# Patient Record
Sex: Male | Born: 1955
Health system: Southern US, Community
[De-identification: ages and names within clinical notes are randomized; demographics above are authoritative.]

## PROBLEM LIST (undated history)

## (undated) DIAGNOSIS — K921 Melena: Secondary | ICD-10-CM

## (undated) DIAGNOSIS — L309 Dermatitis, unspecified: Secondary | ICD-10-CM

## (undated) DIAGNOSIS — C61 Malignant neoplasm of prostate: Secondary | ICD-10-CM

## (undated) DIAGNOSIS — N529 Male erectile dysfunction, unspecified: Secondary | ICD-10-CM

## (undated) DIAGNOSIS — R112 Nausea with vomiting, unspecified: Secondary | ICD-10-CM

## (undated) DIAGNOSIS — E669 Obesity, unspecified: Secondary | ICD-10-CM

## (undated) DIAGNOSIS — B019 Varicella without complication: Secondary | ICD-10-CM

## (undated) DIAGNOSIS — Z9889 Other specified postprocedural states: Secondary | ICD-10-CM

## (undated) DIAGNOSIS — T7840XA Allergy, unspecified, initial encounter: Secondary | ICD-10-CM

## (undated) DIAGNOSIS — R351 Nocturia: Secondary | ICD-10-CM

## (undated) DIAGNOSIS — R0683 Snoring: Secondary | ICD-10-CM

## (undated) DIAGNOSIS — I2510 Atherosclerotic heart disease of native coronary artery without angina pectoris: Secondary | ICD-10-CM

## (undated) DIAGNOSIS — Z0000 Encounter for general adult medical examination without abnormal findings: Secondary | ICD-10-CM

## (undated) DIAGNOSIS — E785 Hyperlipidemia, unspecified: Secondary | ICD-10-CM

## (undated) DIAGNOSIS — I10 Essential (primary) hypertension: Secondary | ICD-10-CM

## (undated) DIAGNOSIS — E663 Overweight: Secondary | ICD-10-CM

## (undated) DIAGNOSIS — N400 Enlarged prostate without lower urinary tract symptoms: Secondary | ICD-10-CM

## (undated) DIAGNOSIS — E119 Type 2 diabetes mellitus without complications: Secondary | ICD-10-CM

## (undated) DIAGNOSIS — M1990 Unspecified osteoarthritis, unspecified site: Secondary | ICD-10-CM

## (undated) HISTORY — DX: Dermatitis, unspecified: L30.9

## (undated) HISTORY — DX: Varicella without complication: B01.9

## (undated) HISTORY — DX: Hyperlipidemia, unspecified: E78.5

## (undated) HISTORY — PX: HERNIA REPAIR: SHX51

## (undated) HISTORY — PX: VASECTOMY: SHX75

## (undated) HISTORY — PX: PROSTATE BIOPSY: SHX241

## (undated) HISTORY — PX: APPENDECTOMY: SHX54

## (undated) HISTORY — DX: Obesity, unspecified: E66.9

## (undated) HISTORY — DX: Allergy, unspecified, initial encounter: T78.40XA

## (undated) HISTORY — DX: Nocturia: R35.1

## (undated) HISTORY — DX: Type 2 diabetes mellitus without complications: E11.9

## (undated) HISTORY — DX: Male erectile dysfunction, unspecified: N52.9

## (undated) HISTORY — DX: Overweight: E66.3

## (undated) HISTORY — DX: Benign prostatic hyperplasia without lower urinary tract symptoms: N40.0

## (undated) HISTORY — DX: Unspecified osteoarthritis, unspecified site: M19.90

## (undated) HISTORY — DX: Snoring: R06.83

## (undated) HISTORY — DX: Essential (primary) hypertension: I10

## (undated) HISTORY — DX: Encounter for general adult medical examination without abnormal findings: Z00.00

## (undated) HISTORY — DX: Melena: K92.1

---

## 2000-03-24 ENCOUNTER — Encounter: Payer: Self-pay | Admitting: Emergency Medicine

## 2000-03-24 ENCOUNTER — Emergency Department (HOSPITAL_COMMUNITY): Admission: EM | Admit: 2000-03-24 | Discharge: 2000-03-24 | Payer: Self-pay | Admitting: Emergency Medicine

## 2000-10-29 ENCOUNTER — Emergency Department (HOSPITAL_COMMUNITY): Admission: EM | Admit: 2000-10-29 | Discharge: 2000-10-29 | Payer: Self-pay | Admitting: Emergency Medicine

## 2000-11-07 ENCOUNTER — Ambulatory Visit (HOSPITAL_COMMUNITY): Admission: RE | Admit: 2000-11-07 | Discharge: 2000-11-07 | Payer: Self-pay | Admitting: *Deleted

## 2000-11-07 ENCOUNTER — Encounter: Payer: Self-pay | Admitting: *Deleted

## 2003-12-27 HISTORY — PX: EYE SURGERY: SHX253

## 2009-12-26 HISTORY — PX: OTHER SURGICAL HISTORY: SHX169

## 2012-09-12 ENCOUNTER — Ambulatory Visit (INDEPENDENT_AMBULATORY_CARE_PROVIDER_SITE_OTHER): Payer: Self-pay | Admitting: Family Medicine

## 2012-09-12 ENCOUNTER — Encounter: Payer: Self-pay | Admitting: Family Medicine

## 2012-09-12 VITALS — BP 132/89 | HR 75 | Temp 97.4°F | Ht 74.0 in | Wt 258.1 lb

## 2012-09-12 DIAGNOSIS — N529 Male erectile dysfunction, unspecified: Secondary | ICD-10-CM

## 2012-09-12 DIAGNOSIS — Z23 Encounter for immunization: Secondary | ICD-10-CM

## 2012-09-12 DIAGNOSIS — Z0000 Encounter for general adult medical examination without abnormal findings: Secondary | ICD-10-CM

## 2012-09-12 DIAGNOSIS — L309 Dermatitis, unspecified: Secondary | ICD-10-CM

## 2012-09-12 DIAGNOSIS — T7840XA Allergy, unspecified, initial encounter: Secondary | ICD-10-CM | POA: Insufficient documentation

## 2012-09-12 DIAGNOSIS — E663 Overweight: Secondary | ICD-10-CM

## 2012-09-12 DIAGNOSIS — R0989 Other specified symptoms and signs involving the circulatory and respiratory systems: Secondary | ICD-10-CM

## 2012-09-12 DIAGNOSIS — R0609 Other forms of dyspnea: Secondary | ICD-10-CM

## 2012-09-12 DIAGNOSIS — E669 Obesity, unspecified: Secondary | ICD-10-CM

## 2012-09-12 DIAGNOSIS — N400 Enlarged prostate without lower urinary tract symptoms: Secondary | ICD-10-CM

## 2012-09-12 DIAGNOSIS — R351 Nocturia: Secondary | ICD-10-CM

## 2012-09-12 DIAGNOSIS — R0683 Snoring: Secondary | ICD-10-CM

## 2012-09-12 DIAGNOSIS — I10 Essential (primary) hypertension: Secondary | ICD-10-CM | POA: Insufficient documentation

## 2012-09-12 DIAGNOSIS — L259 Unspecified contact dermatitis, unspecified cause: Secondary | ICD-10-CM

## 2012-09-12 HISTORY — DX: Obesity, unspecified: E66.9

## 2012-09-12 HISTORY — DX: Nocturia: R35.1

## 2012-09-12 HISTORY — DX: Dermatitis, unspecified: L30.9

## 2012-09-12 HISTORY — DX: Snoring: R06.83

## 2012-09-12 HISTORY — DX: Male erectile dysfunction, unspecified: N52.9

## 2012-09-12 HISTORY — DX: Benign prostatic hyperplasia without lower urinary tract symptoms: N40.0

## 2012-09-12 HISTORY — DX: Encounter for general adult medical examination without abnormal findings: Z00.00

## 2012-09-12 HISTORY — DX: Overweight: E66.3

## 2012-09-12 LAB — CBC
MCV: 91.1 fl (ref 78.0–100.0)
RBC: 5.02 Mil/uL (ref 4.22–5.81)
WBC: 9.2 10*3/uL (ref 4.5–10.5)

## 2012-09-12 LAB — RENAL FUNCTION PANEL
Calcium: 9.5 mg/dL (ref 8.4–10.5)
Glucose, Bld: 125 mg/dL — ABNORMAL HIGH (ref 70–99)
Potassium: 3.9 mEq/L (ref 3.5–5.1)
Sodium: 139 mEq/L (ref 135–145)

## 2012-09-12 LAB — LIPID PANEL
HDL: 36.3 mg/dL — ABNORMAL LOW (ref >39.00)
LDL Cholesterol: 109 mg/dL — ABNORMAL HIGH (ref 0–99)
VLDL: 35.6 mg/dL (ref 0.0–40.0)

## 2012-09-12 LAB — HEPATIC FUNCTION PANEL
ALT: 50 U/L (ref 0–53)
Bilirubin, Direct: 0.2 mg/dL (ref 0.0–0.3)
Total Bilirubin: 1.2 mg/dL (ref 0.3–1.2)

## 2012-09-12 LAB — PSA: PSA: 4.49 ng/mL — ABNORMAL HIGH (ref 0.10–4.00)

## 2012-09-12 MED ORDER — OLMESARTAN-AMLODIPINE-HCTZ 40-10-12.5 MG PO TABS: 1.0000 | ORAL_TABLET | Freq: Every day | ORAL | Status: DC

## 2012-09-12 MED ORDER — SALINE NASAL SPRAY 0.65 % NA SOLN: 2.0000 | NASAL | Status: DC | PRN

## 2012-09-12 MED ORDER — LORATADINE 10 MG PO TABS: 10.0000 mg | ORAL_TABLET | Freq: Every day | ORAL | Status: DC

## 2012-09-12 MED ORDER — FLUTICASONE PROPIONATE 50 MCG/ACT NA SUSP: 2.0000 | Freq: Every day | NASAL | Status: DC

## 2012-09-12 MED ORDER — MICONAZOLE NITRATE 2 % EX LOTN: TOPICAL_LOTION | CUTANEOUS | Status: DC

## 2012-09-12 NOTE — Assessment & Plan Note (Signed)
He notes this has been present since he started his current dose of Tribenzor about a year ago. He will consider Viagra, Levitra or Cialis and let us know if he would like to proceed with one.

## 2012-09-12 NOTE — Assessment & Plan Note (Signed)
He is hypertensive so he is encouraged to avoid sudafed. We will have him switch from Claritin D to plain Claritin. He agrees to stop Afrin and switch to flonase and nasal saline.

## 2012-09-12 NOTE — Patient Instructions (Signed)
Preventive Care for Adults, Male A healthy lifestyle and preventative care can promote health and wellness. Preventative health guidelines for men include the following key practices:  A routine yearly physical is a good way to check with your caregiver about your health and preventative screening. It is a chance to share any concerns and updates on your health, and to receive a thorough exam.   Visit your dentist for a routine exam and preventative care every 6 months. Brush your teeth twice a day and floss once a day. Good oral hygiene prevents tooth decay and gum disease.   The frequency of eye exams is based on your age, health, family medical history, use of contact lenses, and other factors. Follow your caregiver's recommendations for frequency of eye exams.   Eat a healthy diet. Foods like vegetables, fruits, whole grains, low-fat dairy products, and lean protein foods contain the nutrients you need without too many calories. Decrease your intake of foods high in solid fats, added sugars, and salt. Eat the right amount of calories for you.Get information about a proper diet from your caregiver, if necessary.   Regular physical exercise is one of the most important things you can do for your health. Most adults should get at least 150 minutes of moderate-intensity exercise (any activity that increases your heart rate and causes you to sweat) each week. In addition, most adults need muscle-strengthening exercises on 2 or more days a week.   Maintain a healthy weight. The body mass index (BMI) is a screening tool to identify possible weight problems. It provides an estimate of body fat based on height and weight. Your caregiver can help determine your BMI, and can help you achieve or maintain a healthy weight.For adults 20 years and older:   A BMI below 18.5 is considered underweight.   A BMI of 18.5 to 24.9 is normal.   A BMI of 25 to 29.9 is considered overweight.   A BMI of 30 and above  is considered obese.   Maintain normal blood lipids and cholesterol levels by exercising and minimizing your intake of saturated fat. Eat a balanced diet with plenty of fruit and vegetables. Blood tests for lipids and cholesterol should begin at age 20 and be repeated every 5 years. If your lipid or cholesterol levels are high, you are over 50, or you are a high risk for heart disease, you may need your cholesterol levels checked more frequently.Ongoing high lipid and cholesterol levels should be treated with medicines if diet and exercise are not effective.   If you smoke, find out from your caregiver how to quit. If you do not use tobacco, do not start.   If you choose to drink alcohol, do not exceed 2 drinks per day. One drink is considered to be 12 ounces (355 mL) of beer, 5 ounces (148 mL) of wine, or 1.5 ounces (44 mL) of liquor.   Avoid use of street drugs. Do not share needles with anyone. Ask for help if you need support or instructions about stopping the use of drugs.   High blood pressure causes heart disease and increases the risk of stroke. Your blood pressure should be checked at least every 1 to 2 years. Ongoing high blood pressure should be treated with medicines, if weight loss and exercise are not effective.   If you are 45 to 54 years old, ask your caregiver if you should take aspirin to prevent heart disease.   Diabetes screening involves taking a blood   sample to check your fasting blood sugar level. This should be done once every 3 years, after age 45, if you are within normal weight and without risk factors for diabetes. Testing should be considered at a younger age or be carried out more frequently if you are overweight and have at least 1 risk factor for diabetes.   Colorectal cancer can be detected and often prevented. Most routine colorectal cancer screening begins at the age of 50 and continues through age 75. However, your caregiver may recommend screening at an earlier  age if you have risk factors for colon cancer. On a yearly basis, your caregiver may provide home test kits to check for hidden blood in the stool. Use of a small camera at the end of a tube, to directly examine the colon (sigmoidoscopy or colonoscopy), can detect the earliest forms of colorectal cancer. Talk to your caregiver about this at age 50, when routine screening begins. Direct examination of the colon should be repeated every 5 to 10 years through age 75, unless early forms of pre-cancerous polyps or small growths are found.   Hepatitis C blood testing is recommended for all people born from 1945 through 1965 and any individual with known risks for hepatitis C.   Practice safe sex. Use condoms and avoid high-risk sexual practices to reduce the spread of sexually transmitted infections (STIs). STIs include gonorrhea, chlamydia, syphilis, trichomonas, herpes, HPV, and human immunodeficiency virus (HIV). Herpes, HIV, and HPV are viral illnesses that have no cure. They can result in disability, cancer, and death.   A one-time screening for abdominal aortic aneurysm (AAA) and surgical repair of large AAAs by sound wave imaging (ultrasonography) is recommended for ages 65 to 75 years who are current or former smokers.   Healthy men should no longer receive prostate-specific antigen (PSA) blood tests as part of routine cancer screening. Consult with your caregiver about prostate cancer screening.   Testicular cancer screening is not recommended for adult males who have no symptoms. Screening includes self-exam, caregiver exam, and other screening tests. Consult with your caregiver about any symptoms you have or any concerns you have about testicular cancer.   Use sunscreen with skin protection factor (SPF) of 30 or more. Apply sunscreen liberally and repeatedly throughout the day. You should seek shade when your shadow is shorter than you. Protect yourself by wearing long sleeves, pants, a  wide-brimmed hat, and sunglasses year round, whenever you are outdoors.   Once a month, do a whole body skin exam, using a mirror to look at the skin on your back. Notify your caregiver of new moles, moles that have irregular borders, moles that are larger than a pencil eraser, or moles that have changed in shape or color.   Stay current with required immunizations.   Influenza. You need a dose every fall (or winter). The composition of the flu vaccine changes each year, so being vaccinated once is not enough.   Pneumococcal polysaccharide. You need 1 to 2 doses if you smoke cigarettes or if you have certain chronic medical conditions. You need 1 dose at age 65 (or older) if you have never been vaccinated.   Tetanus, diphtheria, pertussis (Tdap, Td). Get 1 dose of Tdap vaccine if you are younger than age 65 years, are over 65 and have contact with an infant, are a healthcare worker, or simply want to be protected from whooping cough. After that, you need a Td booster dose every 10 years. Consult your caregiver if   you have not had at least 3 tetanus and diphtheria-containing shots sometime in your life or have a deep or dirty wound.   HPV. This vaccine is recommended for males 13 through 54 years of age. This vaccine may be given to men 22 through 54 years of age who have not completed the 3 dose series. It is recommended for men through age 26 who have sex with men or whose immune system is weakened because of HIV infection, other illness, or medications. The vaccine is given in 3 doses over 6 months.   Measles, mumps, rubella (MMR). You need at least 1 dose of MMR if you were born in 1957 or later. You may also need a 2nd dose.   Meningococcal. If you are age 19 to 21 years and a first-year college student living in a residence hall, or have one of several medical conditions, you need to get vaccinated against meningococcal disease. You may also need additional booster doses.   Zoster (shingles).  If you are age 60 years or older, you should get this vaccine.   Varicella (chickenpox). If you have never had chickenpox or you were vaccinated but received only 1 dose, talk to your caregiver to find out if you need this vaccine.   Hepatitis A. You need this vaccine if you have a specific risk factor for hepatitis A virus infection, or you simply wish to be protected from this disease. The vaccine is usually given as 2 doses, 6 to 18 months apart.   Hepatitis B. You need this vaccine if you have a specific risk factor for hepatitis B virus infection or you simply wish to be protected from this disease. The vaccine is given in 3 doses, usually over 6 months.  Preventative Service / Frequency Ages 19 to 39  Blood pressure check.** / Every 1 to 2 years.   Lipid and cholesterol check.** / Every 5 years beginning at age 20.   Hepatitis C blood test.** / For any individual with known risks for hepatitis C.   Skin self-exam. / Monthly.   Influenza immunization.** / Every year.   Pneumococcal polysaccharide immunization.** / 1 to 2 doses if you smoke cigarettes or if you have certain chronic medical conditions.   Tetanus, diphtheria, pertussis (Tdap,Td) immunization. / A one-time dose of Tdap vaccine. After that, you need a Td booster dose every 10 years.   HPV immunization. / 3 doses over 6 months, if 26 and younger.   Measles, mumps, rubella (MMR) immunization. / You need at least 1 dose of MMR if you were born in 1957 or later. You may also need a 2nd dose.   Meningococcal immunization. / 1 dose if you are age 19 to 21 years and a first-year college student living in a residence hall, or have one of several medical conditions, you need to get vaccinated against meningococcal disease. You may also need additional booster doses.   Varicella immunization.** / Consult your caregiver.   Hepatitis A immunization.** / Consult your caregiver. 2 doses, 6 to 18 months apart.   Hepatitis B  immunization.** / Consult your caregiver. 3 doses usually over 6 months.  Ages 40 to 64  Blood pressure check.** / Every 1 to 2 years.   Lipid and cholesterol check.** / Every 5 years beginning at age 20.   Fecal occult blood test (FOBT) of stool. / Every year beginning at age 50 and continuing until age 75. You may not have to do this test if   you get colonoscopy every 10 years.   Flexible sigmoidoscopy** or colonoscopy.** / Every 5 years for a flexible sigmoidoscopy or every 10 years for a colonoscopy beginning at age 25 and continuing until age 30.   Hepatitis C blood test.** / For all people born from [birth year] through 1965 and any individual with known risks for hepatitis C.   Skin self-exam. / Monthly.   Influenza immunization.** / Every year.   Pneumococcal polysaccharide immunization.** / 1 to 2 doses if you smoke cigarettes or if you have certain chronic medical conditions.   Tetanus, diphtheria, pertussis (Tdap/Td) immunization.** / A one-time dose of Tdap vaccine. After that, you need a Td booster dose every 10 years.   Measles, mumps, rubella (MMR) immunization. / You need at least 1 dose of MMR if you were born in 1957 or later. You may also need a 2nd dose.   Varicella immunization.**/ Consult your caregiver.   Meningococcal immunization.** / Consult your caregiver.   Hepatitis A immunization.** / Consult your caregiver. 2 doses, 6 to 18 months apart.   Hepatitis B immunization.** / Consult your caregiver. 3 doses, usually over 6 months.  Ages 71 and over  Blood pressure check.** / Every 1 to 2 years.   Lipid and cholesterol check.**/ Every 5 years beginning at age 75.   Fecal occult blood test (FOBT) of stool. / Every year beginning at age 7 and continuing until age 33. You may not have to do this test if you get colonoscopy every 10 years.   Flexible sigmoidoscopy** or colonoscopy.** / Every 5 years for a flexible sigmoidoscopy or every 10 years for a colonoscopy  beginning at age 27 and continuing until age 53.   Hepatitis C blood test.** / For all people born from [birth year] through 1965 and any individual with known risks for hepatitis C.   Abdominal aortic aneurysm (AAA) screening.** / A one-time screening for ages 61 to 5 years who are current or former smokers.   Skin self-exam. / Monthly.   Influenza immunization.** / Every year.   Pneumococcal polysaccharide immunization.** / 1 dose at age 82 (or older) if you have never been vaccinated.   Tetanus, diphtheria, pertussis (Tdap, Td) immunization. / A one-time dose of Tdap vaccine if you are over 65 and have contact with an infant, are a Research scientist (physical sciences), or simply want to be protected from whooping cough. After that, you need a Td booster dose every 10 years.   Varicella immunization. ** / Consult your caregiver.   Meningococcal immunization.** / Consult your caregiver.   Hepatitis A immunization. ** / Consult your caregiver. 2 doses, 6 to 18 months apart.   Hepatitis B immunization.** / Check with your caregiver. 3 doses, usually over 6 months.  **Family history and personal history of risk and conditions may change your caregiver's recommendations. Document Released: 02/07/2002 Document Revised: 12/01/2011 Document Reviewed: 05/09/2011 [facility] Patient Information 2012 [place], [place]. Check websites of Viagra, Levitra and Cialis and let us know if you want a prescription  MegaRed krill oil caps 1 daily

## 2012-09-12 NOTE — Assessment & Plan Note (Signed)
Miconazole lotion daily for possibility of tinea. Patient can be referred to dermatology for further evaluation if symptoms persist or she has any concerns

## 2012-09-12 NOTE — Assessment & Plan Note (Addendum)
Declines referral for colonoscopy today but will consider, agrees to proceed with iFOB testing and fasting labs today. Request old records

## 2012-09-12 NOTE — Progress Notes (Signed)
Patient ID: Joseph Fowler, male   DOB: 1956-06-05, 56 y.o.   MRN: [id number] Joseph Fowler [id number] 06-24-1956 09/12/2012      Progress Note New Patient  Subjective  Chief Complaint  Chief Complaint  Patient presents with  . Establish Care    new patient    HPI  Patient is a 56 year old Caucasian male who is in today for new patient appointment. He has a long history about 5 years of blood pressure and tried multiple medications before they were able to find the right strength of Tribenzor to treat his blood pressure adequately. His biggest complaint with medications that he's noted some trouble with rectal dysfunction since this higher dose was started roughly a year now. Has a lot of with allergies and nasal congestion. Unfortunately uses Afrin and Sudafed routinely. As well as some Claritin. He reports he does snore and has trouble breathing and sleeping at night. Wakes up frequently at night he urinates anywhere from 3-7 times a night. Previously took Flomax for prostate hypertrophy but this was not helpful. He is taking his blood pressure medication at bedtime which does contain a diuretic. He denies chest pain, palpitations, shortness of breath, and GI or GU complaints. He had a traumatic injury to his right eye years ago in his minimal vision in that time. Notes that his lower legs from roughly from mid calf down have been discolored for quite some time. His daughters noticed it and it will not improve. It is asymptomatic and not itching is not worsening. The slides lotion but that is helpful.  Past Medical History  Diagnosis Date  . Chicken pox as a child  . Hypertension 56 yrs old  . ED (erectile dysfunction) 09/12/2012  . BPH (benign prostatic hyperplasia) 09/12/2012  . Preventative health care 09/12/2012  . Overweight 09/12/2012  . Dermatitis 09/12/2012  . Nocturia 09/12/2012  . Allergy     Past Surgical History  Procedure Date  . Appendectomy 56 yrs old  . Vasectomy 56 yrs old   . Torn meninscus 2011    knee- right  . Eye surgery 2005    ,right, injury, decreased visual acuity, photophobia  . Prostate biopsy     Family History  Problem Relation Age of Onset  . Diabetes Father     type 2  . Other Brother     whole in heart  . Heart disease Brother     sudden cardiac death  . Stroke Maternal Grandmother     X 2  . Arthritis Paternal Uncle     History   Social History  . Marital Status: Married    Spouse Name: N/A    Number of Children: N/A  . Years of Education: N/A   Occupational History  . Not on file.   Social History Main Topics  . Smoking status: Former Smoker -- 2.0 packs/day for 8 years    Types: Cigarettes    Quit date: 12/26/1981  . Smokeless tobacco: Never Used  . Alcohol Use: Yes     occasional  . Drug Use: No  . Sexually Active: Yes   Other Topics Concern  . Not on file   Social History Narrative  . No narrative on file    Current Outpatient Prescriptions on File Prior to Visit  Medication Sig Dispense Refill  . Olmesartan-Amlodipine-HCTZ (TRIBENZOR) 40-10-12.5 MG TABS Take 1 tablet by mouth daily.  90 tablet  2  . fluticasone (FLONASE) 50 MCG/ACT nasal spray Place 2 sprays  into the nose daily.  16 g  6  . loratadine (CLARITIN) 10 MG tablet Take 1 tablet (10 mg total) by mouth daily.  90 tablet  3  . sodium chloride (AYR) 0.65 % nasal spray Place 2 sprays into the nose as needed for congestion.  30 mL  12    No Known Allergies  Review of Systems  Review of Systems  Constitutional: Negative for fever, chills and malaise/fatigue.  HENT: Negative for hearing loss, nosebleeds and congestion.   Eyes: Negative for discharge.  Respiratory: Negative for cough, sputum production, shortness of breath and wheezing.   Cardiovascular: Negative for chest pain, palpitations and leg swelling.  Gastrointestinal: Negative for heartburn, nausea, vomiting, abdominal pain, diarrhea, constipation and blood in stool.  Genitourinary:  Negative for dysuria, urgency, frequency and hematuria.  Musculoskeletal: Negative for myalgias, back pain and falls.  Skin: Negative for rash.  Neurological: Negative for dizziness, tremors, sensory change, focal weakness, loss of consciousness, weakness and headaches.  Endo/Heme/Allergies: Negative for polydipsia. Does not bruise/bleed easily.  Psychiatric/Behavioral: Negative for depression and suicidal ideas. The patient is not nervous/anxious and does not have insomnia.     Objective  BP 132/89  Pulse 75  Temp 97.4 F (36.3 C) (Temporal)  Ht 6\' 2"  (1.88 m)  Wt 258 lb 1.9 oz (117.082 kg)  BMI 33.14 kg/m2  SpO2 97%  Physical Exam  Physical Exam  Constitutional: He is oriented to person, place, and time and well-developed, well-nourished, and in no distress. No distress.  HENT:  Head: Normocephalic and atraumatic.  Eyes: EOM are normal. Right eye exhibits no discharge. Left eye exhibits no discharge. No scleral icterus.       Right pupil larger than left pupil minimally reactive  Neck: Neck supple. No thyromegaly present.  Cardiovascular: Normal rate, regular rhythm and normal heart sounds.   No murmur heard. Pulmonary/Chest: Effort normal and breath sounds normal. No respiratory distress.  Abdominal: He exhibits no distension and no mass. There is no tenderness.  Musculoskeletal: He exhibits no edema.  Neurological: He is alert and oriented to person, place, and time.  Skin: Skin is warm.  Psychiatric: Memory, affect and judgment normal.       Assessment & Plan  Dermatitis Miconazole lotion daily for possibility of tinea. Patient can be referred to dermatology for further evaluation if symptoms persist or she has any concerns  BPH (benign prostatic hyperplasia) We will request old records from previous PMD and repeat a PSA today, will consider a referral back to urology if numbers still elevated.   Allergic state He is hypertensive so he is encouraged to avoid  sudafed. We will have him switch from Claritin D to plain Claritin. He agrees to stop Afrin and switch to flonase and nasal saline.   Hypertension Given refill on his Tribenzor today. He tried multiple medication combos before they were able to get this medication to work well for him  Nocturia He takes his diuretic qhs he is encouraged to switch this to morning and will reconsider if no improvement  Preventative health care Declines referral for colonoscopy today but will consider, agrees to proceed with iFOB testing and fasting labs today. Request old records  ED (erectile dysfunction) He notes this has been present since he started his current dose of Tribenzor about a year ago. He will consider Viagra, Levitra or Cialis and let us know if he would like to proceed with one.  Overweight DASH diet encouraged, check labs. Increase exercise  Snoring With fatigue, HTN, weight gain, agrees to proceed with sleep study at home

## 2012-09-12 NOTE — Assessment & Plan Note (Signed)
Given refill on his Tribenzor today. He tried multiple medication combos before they were able to get this medication to work well for him

## 2012-09-12 NOTE — Assessment & Plan Note (Signed)
We will request old records from previous PMD and repeat a PSA today, will consider a referral back to urology if numbers still elevated.

## 2012-09-12 NOTE — Assessment & Plan Note (Signed)
With fatigue, HTN, weight gain, agrees to proceed with sleep study at home

## 2012-09-12 NOTE — Assessment & Plan Note (Signed)
DASH diet encouraged, check labs. Increase exercise

## 2012-09-12 NOTE — Assessment & Plan Note (Signed)
He takes his diuretic qhs he is encouraged to switch this to morning and will reconsider if no improvement

## 2012-09-13 LAB — HEMOGLOBIN A1C: Hgb A1c MFr Bld: 6 % (ref 4.6–6.5)

## 2012-09-13 NOTE — Progress Notes (Signed)
Quick Note:  Patient Informed and voiced understanding.  I also had Kristi add the A1C ______

## 2012-10-03 ENCOUNTER — Other Ambulatory Visit: Payer: Self-pay | Admitting: Family Medicine

## 2012-10-03 DIAGNOSIS — R5383 Other fatigue: Secondary | ICD-10-CM

## 2012-10-03 DIAGNOSIS — I10 Essential (primary) hypertension: Secondary | ICD-10-CM

## 2012-12-03 ENCOUNTER — Encounter: Payer: Self-pay | Admitting: Family Medicine

## 2012-12-03 ENCOUNTER — Encounter: Payer: Self-pay | Admitting: Gastroenterology

## 2012-12-03 ENCOUNTER — Ambulatory Visit (INDEPENDENT_AMBULATORY_CARE_PROVIDER_SITE_OTHER): Payer: Self-pay | Admitting: Family Medicine

## 2012-12-03 VITALS — BP 138/88 | HR 86 | Temp 97.6°F | Ht 74.0 in | Wt 269.4 lb

## 2012-12-03 DIAGNOSIS — I10 Essential (primary) hypertension: Secondary | ICD-10-CM

## 2012-12-03 DIAGNOSIS — K921 Melena: Secondary | ICD-10-CM | POA: Insufficient documentation

## 2012-12-03 DIAGNOSIS — N529 Male erectile dysfunction, unspecified: Secondary | ICD-10-CM

## 2012-12-03 DIAGNOSIS — N400 Enlarged prostate without lower urinary tract symptoms: Secondary | ICD-10-CM

## 2012-12-03 HISTORY — DX: Melena: K92.1

## 2012-12-03 MED ORDER — SILDENAFIL CITRATE 100 MG PO TABS: 50.0000 mg | ORAL_TABLET | Freq: Every day | ORAL | Status: DC | PRN

## 2012-12-03 NOTE — Patient Instructions (Addendum)
Increase fluids, calcium, magnesium and zinc tab daily. Try [facility]'s night time leg cramp meds as needed   Leg Cramps Leg cramps that occur during exercise can be caused by poor circulation or dehydration. However, muscle cramps that occur at rest or during the night are usually not due to any serious medical problem. Heat cramps may cause muscle spasms during hot weather.  CAUSES There is no clear cause for muscle cramps. However, dehydration may be a factor for those who do not drink enough fluids and those who exercise in the heat. Imbalances in the level of sodium, potassium, calcium or magnesium in the muscle tissue may also be a factor. Some medications, such as water pills (diuretics), may cause loss of chemicals that the body needs (like sodium and potassium) and cause muscle cramps. TREATMENT   Make sure your diet has enough fluids and essential minerals for the muscle to work normally.  Avoid strenuous exercise for several days if you have been having frequent leg cramps.  Stretch and massage the cramped muscle for several minutes.  Some medicines may be helpful in some patients with night cramps. Only take over-the-counter or prescription medicines as directed by your caregiver. SEEK IMMEDIATE MEDICAL CARE IF:   Your leg cramps become worse.  Your foot becomes cold, numb, or blue. Document Released: 01/19/2005 Document Revised: 03/05/2012 Document Reviewed: 01/06/2009 [facility] Patient Information 2013 [place], [place].

## 2012-12-03 NOTE — Progress Notes (Signed)
Patient ID: Joseph Fowler, male   DOB: 09/23/1956, 56 y.o.   MRN: [id number] Joseph Fowler [id number] January 08, 1956 12/03/2012      Progress Note-Follow Up  Subjective  Chief Complaint  Chief Complaint  Patient presents with  . Follow-up    3 month    HPI  Patient is a 56 year old Caucasian male who is in today for followup. He has a couple concerns. One is a mild sore throat but no other associated symptoms. Denies fevers, chills, malaise, congestion, ear pain, chest pain, palpitations, shortness of breath. It only been present a day or so it is very mild. He also notes he occasionally sees blood in the tissue when he wipes after a bowel movement. Some of the bowel movement is hard but often it is soft. Bowel movement itself does not have blood in it nor is it black and sticky. No abdominal pain, nausea, vomiting, anorexia or other complaints are noted. Does note an occasional episode of urinary hesitancy and worsening scrotal pain but has not followed up with urology after her urology workup a couple of years ago. No other acute complaints offered today appear  Past Medical History  Diagnosis Date  . Chicken pox as a child  . Hypertension 56 yrs old  . ED (erectile dysfunction) 09/12/2012  . BPH (benign prostatic hyperplasia) 09/12/2012  . Preventative health care 09/12/2012  . Overweight 09/12/2012  . Dermatitis 09/12/2012  . Nocturia 09/12/2012  . Allergy   . Snoring 09/12/2012  . Blood in stool 12/03/2012    Past Surgical History  Procedure Date  . Appendectomy 56 yrs old  . Vasectomy 56 yrs old  . Torn meninscus 2011    knee- right  . Eye surgery 2005    ,right, injury, decreased visual acuity, photophobia  . Prostate biopsy     Family History  Problem Relation Age of Onset  . Diabetes Father     type 2  . Other Brother     whole in heart  . Heart disease Brother     sudden cardiac death  . Stroke Maternal Grandmother     X 2  . Arthritis Paternal Uncle     History    Social History  . Marital Status: Married    Spouse Name: N/A    Number of Children: N/A  . Years of Education: N/A   Occupational History  . Not on file.   Social History Main Topics  . Smoking status: Former Smoker -- 2.0 packs/day for 8 years    Types: Cigarettes    Quit date: 12/26/1981  . Smokeless tobacco: Never Used  . Alcohol Use: Yes     Comment: occasional  . Drug Use: No  . Sexually Active: Yes   Other Topics Concern  . Not on file   Social History Narrative  . No narrative on file    Current Outpatient Prescriptions on File Prior to Visit  Medication Sig Dispense Refill  . Cyanocobalamin (VITAMIN B-12 CR PO) Take 1 tablet by mouth daily.      . fluticasone (FLONASE) 50 MCG/ACT nasal spray Place 2 sprays into the nose daily.  16 g  6  . Green Tea, Camillia sinensis, (GREEN TEA PO) Take 2 capsules by mouth daily.      Marland Kitchen loratadine (CLARITIN) 10 MG tablet Take 1 tablet (10 mg total) by mouth daily.  90 tablet  3  . Miconazole Nitrate 2 % LOTN Apply to lower legs daily after shower  for 30 days.  56 g  1  . Multiple Vitamin (MULTIVITAMIN) tablet Take 1 tablet by mouth daily.      . Olmesartan-Amlodipine-HCTZ (TRIBENZOR) 40-10-12.5 MG TABS Take 1 tablet by mouth daily.  90 tablet  2  . sodium chloride (AYR) 0.65 % nasal spray Place 2 sprays into the nose as needed for congestion.  30 mL  12  . vitamin C (ASCORBIC ACID) 500 MG tablet Take 500 mg by mouth daily.      Marland Kitchen VITAMIN D, ERGOCALCIFEROL, PO Take 1 capsule by mouth daily.      . Vitamins C E (CRANBERRY CONCENTRATE PO) Take 1 tablet by mouth daily.      . sildenafil (VIAGRA) 100 MG tablet Take 0.5-1 tablets (50-100 mg total) by mouth daily as needed for erectile dysfunction.  6 tablet  1    No Known Allergies  Review of Systems  Review of Systems  Constitutional: Negative for fever and malaise/fatigue.  HENT: Positive for sore throat. Negative for congestion.   Eyes: Negative for discharge.   Respiratory: Negative for shortness of breath.   Cardiovascular: Negative for chest pain, palpitations and leg swelling.  Gastrointestinal: Positive for blood in stool. Negative for heartburn, nausea, vomiting, abdominal pain and diarrhea.  Genitourinary: Negative for dysuria.  Musculoskeletal: Negative for falls.  Skin: Negative for rash.  Neurological: Negative for loss of consciousness and headaches.  Endo/Heme/Allergies: Negative for polydipsia.  Psychiatric/Behavioral: Negative for depression and suicidal ideas. The patient is not nervous/anxious and does not have insomnia.     Objective  BP 138/88  Pulse 86  Temp 97.6 F (36.4 C) (Temporal)  Ht 6\' 2"  (1.88 m)  Wt 269 lb 6.4 oz (122.199 kg)  BMI 34.59 kg/m2  SpO2 97%  Physical Exam  Physical Exam  Constitutional: He is oriented to person, place, and time and well-developed, well-nourished, and in no distress. No distress.  HENT:  Head: Normocephalic and atraumatic.  Eyes: Conjunctivae normal are normal.  Neck: Neck supple. No thyromegaly present.  Cardiovascular: Normal rate, regular rhythm and normal heart sounds.   No murmur heard. Pulmonary/Chest: Effort normal and breath sounds normal. No respiratory distress.  Abdominal: He exhibits no distension and no mass. There is no tenderness.  Musculoskeletal: He exhibits no edema.  Neurological: He is alert and oriented to person, place, and time.  Skin: Skin is warm.  Psychiatric: Memory, affect and judgment normal.    Lab Results  Component Value Date   TSH 0.98 09/12/2012   Lab Results  Component Value Date   WBC 9.2 09/12/2012   HGB 15.2 09/12/2012   HCT 45.7 09/12/2012   MCV 91.1 09/12/2012   PLT 238.0 09/12/2012   Lab Results  Component Value Date   CREATININE 1.0 09/12/2012   BUN 17 09/12/2012   NA 139 09/12/2012   K 3.9 09/12/2012   CL 103 09/12/2012   CO2 27 09/12/2012   Lab Results  Component Value Date   ALT 50 09/12/2012   AST 32 09/12/2012   ALKPHOS  50 09/12/2012   BILITOT 1.2 09/12/2012   Lab Results  Component Value Date   CHOL 181 09/12/2012   Lab Results  Component Value Date   HDL 36.30* 09/12/2012   Lab Results  Component Value Date   LDLCALC 109* 09/12/2012   Lab Results  Component Value Date   TRIG 178.0* 09/12/2012   Lab Results  Component Value Date   CHOLHDL 5 09/12/2012     Assessment &  Plan  Blood in stool Did bring complete stool testing kit today. We'll await results. He is set up for referral to colonoscopy in January. He has never had a screening colonoscopy. Does note sometimes when he sees a small amount of blood on the tissue he's had to strain but not every time. Encourage increased fiber and probiotic and await results. Seek immediate care if bleeding starts and does not stop  ED (erectile dysfunction) Given prescription for Viagra 100 mg to try prn  Hypertension Improved on repeat encouraged DASH and recheck in 3-4 months or as needed  BPH (benign prostatic hyperplasia) Mild elevation in PSA with labs, offered urology consultation but will declines for now while we investigate the blood in stool. He understands he may need to see [facility] again in near future.

## 2012-12-03 NOTE — Assessment & Plan Note (Signed)
Given prescription for Viagra 100 mg to try prn

## 2012-12-03 NOTE — Assessment & Plan Note (Signed)
Mild elevation in PSA with labs, offered urology consultation but will declines for now while we investigate the blood in stool. He understands he may need to see [facility] again in near future.

## 2012-12-03 NOTE — Assessment & Plan Note (Signed)
Did bring complete stool testing kit today. We'll await results. He is set up for referral to colonoscopy in January. He has never had a screening colonoscopy. Does note sometimes when he sees a small amount of blood on the tissue he's had to strain but not every time. Encourage increased fiber and probiotic and await results. Seek immediate care if bleeding starts and does not stop

## 2012-12-03 NOTE — Assessment & Plan Note (Signed)
Improved on repeat encouraged DASH and recheck in 3-4 months or as needed

## 2012-12-04 ENCOUNTER — Other Ambulatory Visit: Payer: Self-pay | Admitting: Family Medicine

## 2012-12-04 ENCOUNTER — Other Ambulatory Visit (INDEPENDENT_AMBULATORY_CARE_PROVIDER_SITE_OTHER): Payer: Self-pay

## 2012-12-04 ENCOUNTER — Other Ambulatory Visit: Payer: Self-pay | Admitting: Emergency Medicine

## 2012-12-04 DIAGNOSIS — K921 Melena: Secondary | ICD-10-CM

## 2012-12-04 LAB — FECAL OCCULT BLOOD, IMMUNOCHEMICAL: Fecal Occult Bld: NEGATIVE

## 2012-12-06 NOTE — Progress Notes (Signed)
Quick Note:  Patient Informed and voiced understanding ______ 

## 2012-12-12 ENCOUNTER — Ambulatory Visit: Payer: PRIVATE HEALTH INSURANCE | Admitting: Family Medicine

## 2013-01-08 ENCOUNTER — Ambulatory Visit: Payer: Self-pay | Admitting: Gastroenterology

## 2013-03-07 ENCOUNTER — Telehealth: Payer: Self-pay | Admitting: Emergency Medicine

## 2013-03-07 DIAGNOSIS — I10 Essential (primary) hypertension: Secondary | ICD-10-CM

## 2013-03-07 MED ORDER — OLMESARTAN-AMLODIPINE-HCTZ 40-10-12.5 MG PO TABS: 1.0000 | ORAL_TABLET | Freq: Every day | ORAL | Status: DC

## 2013-03-07 NOTE — Telephone Encounter (Signed)
Rx to pharmacy; LM at number phoned in from to inform pt Rx sent to last pharmacy used for refill/SLS

## 2013-03-08 ENCOUNTER — Telehealth: Payer: Self-pay | Admitting: *Deleted

## 2013-03-08 NOTE — Telephone Encounter (Signed)
Received fax from [facility] re: prior auth for tribenzor 40-10-12.5mg . 2 pack of samples left at front desk for pt to pick up. Form completed and forwarded to Provider for signature.

## 2013-03-13 ENCOUNTER — Telehealth: Payer: Self-pay

## 2013-03-13 NOTE — Telephone Encounter (Signed)
We received a denial for pts Tribenzor. Per Dr Abner Greenspan we can do Losartan HCTZ and Amlodipine.  Left a detailed message on pts vm and asked for a return call

## 2013-03-13 NOTE — Telephone Encounter (Signed)
D/c tribenzor and start Amlodipine 5 mg tab daily, disp #30 with 1 rf and Losartan hact 50/12.5 1 tab daily, disp #30 1 rf  Needs bp check in about a month

## 2013-03-14 MED ORDER — LOSARTAN POTASSIUM-HCTZ 50-12.5 MG PO TABS: 1.0000 | ORAL_TABLET | Freq: Every day | ORAL | Status: DC

## 2013-03-14 MED ORDER — AMLODIPINE BESYLATE 5 MG PO TABS: 5.0000 mg | ORAL_TABLET | Freq: Every day | ORAL | Status: DC

## 2013-03-14 NOTE — Telephone Encounter (Signed)
Left a detailed message for patient on vm.  

## 2013-03-14 NOTE — Telephone Encounter (Signed)
Sent RX's to pharmacy. Will try and contact pt again

## 2013-03-26 NOTE — Telephone Encounter (Signed)
Tribenzor PA has been denied. Look at prior note in chart

## 2013-05-21 ENCOUNTER — Other Ambulatory Visit: Payer: Self-pay | Admitting: Family Medicine

## 2013-05-22 MED ORDER — AMLODIPINE BESYLATE 5 MG PO TABS: 5.0000 mg | ORAL_TABLET | Freq: Every day | ORAL | Status: DC

## 2013-05-30 ENCOUNTER — Telehealth: Payer: Self-pay | Admitting: Family Medicine

## 2013-05-30 MED ORDER — LOSARTAN POTASSIUM-HCTZ 50-12.5 MG PO TABS: 1.0000 | ORAL_TABLET | Freq: Every day | ORAL | Status: DC

## 2013-05-30 NOTE — Telephone Encounter (Signed)
Losartin/hct 50-12.5 mg tab take one tablet by mouth once daily qty 30 last fill 04-25-2013

## 2013-06-03 ENCOUNTER — Telehealth: Payer: Self-pay | Admitting: *Deleted

## 2013-06-03 NOTE — Telephone Encounter (Signed)
Patient called stating that his losartin had not been sent to pharmacy. Explained to patient that rx had been sent in and that I would call to make sure pharmacy had received. Called pharmacy and they said rx was ready for pickup.

## 2013-07-02 ENCOUNTER — Other Ambulatory Visit: Payer: Self-pay | Admitting: *Deleted

## 2013-07-02 MED ORDER — AMLODIPINE BESYLATE 5 MG PO TABS: 5.0000 mg | ORAL_TABLET | Freq: Every day | ORAL | Status: DC

## 2013-07-02 NOTE — Telephone Encounter (Signed)
Rx request to pharmacy/SLS  

## 2013-09-06 ENCOUNTER — Encounter: Payer: Self-pay | Admitting: Family Medicine

## 2013-09-06 ENCOUNTER — Ambulatory Visit (INDEPENDENT_AMBULATORY_CARE_PROVIDER_SITE_OTHER): Payer: BC Managed Care – PPO | Admitting: Family Medicine

## 2013-09-06 ENCOUNTER — Telehealth: Payer: Self-pay | Admitting: Family Medicine

## 2013-09-06 VITALS — BP 148/100 | HR 65 | Temp 97.9°F | Ht 74.0 in | Wt 264.1 lb

## 2013-09-06 DIAGNOSIS — Z5189 Encounter for other specified aftercare: Secondary | ICD-10-CM

## 2013-09-06 DIAGNOSIS — N400 Enlarged prostate without lower urinary tract symptoms: Secondary | ICD-10-CM

## 2013-09-06 DIAGNOSIS — M129 Arthropathy, unspecified: Secondary | ICD-10-CM

## 2013-09-06 DIAGNOSIS — I10 Essential (primary) hypertension: Secondary | ICD-10-CM

## 2013-09-06 DIAGNOSIS — T7840XD Allergy, unspecified, subsequent encounter: Secondary | ICD-10-CM

## 2013-09-06 DIAGNOSIS — Z0000 Encounter for general adult medical examination without abnormal findings: Secondary | ICD-10-CM

## 2013-09-06 DIAGNOSIS — M1990 Unspecified osteoarthritis, unspecified site: Secondary | ICD-10-CM

## 2013-09-06 DIAGNOSIS — E663 Overweight: Secondary | ICD-10-CM

## 2013-09-06 DIAGNOSIS — Z23 Encounter for immunization: Secondary | ICD-10-CM

## 2013-09-06 HISTORY — DX: Unspecified osteoarthritis, unspecified site: M19.90

## 2013-09-06 MED ORDER — LOSARTAN POTASSIUM-HCTZ 50-12.5 MG PO TABS: 1.0000 | ORAL_TABLET | Freq: Every day | ORAL | Status: DC

## 2013-09-06 MED ORDER — LORATADINE 10 MG PO TABS: 10.0000 mg | ORAL_TABLET | Freq: Every day | ORAL | Status: DC

## 2013-09-06 MED ORDER — AMLODIPINE BESYLATE 5 MG PO TABS: 5.0000 mg | ORAL_TABLET | Freq: Every day | ORAL | Status: DC

## 2013-09-06 NOTE — Progress Notes (Signed)
Patient ID: Joseph Fowler, male   DOB: Apr 12, 1956, 57 y.o.   MRN: [id number] Joseph Fowler [id number] May 31, 1956 09/06/2013      Progress Note-Follow Up  Subjective  Chief Complaint  Chief Complaint  Patient presents with  . Follow-up  . Injections    flu    HPI  Patient is a 57 year old Caucasian male who is in today for followup on blood pressure. He has been out of his amlodipine for about 3 weeks now. He denies any chest pain, headaches or shortness of breath but has had an occasional palpitation. Otherwise she reports feeling well. He has began to exercise by riding his bike most mornings. Is trying to eat slightly better with less transplants in fast food. Is complaining of contracture of his right fourth finger but no pain or recent trauma. No redness or warmth.  Past Medical History  Diagnosis Date  . Chicken pox as a child  . Hypertension 57 yrs old  . ED (erectile dysfunction) 09/12/2012  . BPH (benign prostatic hyperplasia) 09/12/2012  . Preventative health care 09/12/2012  . Overweight(278.02) 09/12/2012  . Dermatitis 09/12/2012  . Nocturia 09/12/2012  . Allergy   . Snoring 09/12/2012  . Blood in stool 12/03/2012  . Arthritis 09/06/2013    Past Surgical History  Procedure Laterality Date  . Appendectomy  57 yrs old  . Vasectomy  57 yrs old  . Torn meninscus  2011    knee- right  . Eye surgery  2005    ,right, injury, decreased visual acuity, photophobia  . Prostate biopsy      Family History  Problem Relation Age of Onset  . Diabetes Father     type 2  . Other Brother     whole in heart  . Heart disease Brother     sudden cardiac death  . Stroke Maternal Grandmother     X 2  . Arthritis Paternal Uncle     History   Social History  . Marital Status: Married    Spouse Name: N/A    Number of Children: N/A  . Years of Education: N/A   Occupational History  . Not on file.   Social History Main Topics  . Smoking status: Former Smoker -- 2.00 packs/day  for 8 years    Types: Cigarettes    Quit date: 12/26/1981  . Smokeless tobacco: Never Used  . Alcohol Use: Yes     Comment: occasional  . Drug Use: No  . Sexual Activity: Yes   Other Topics Concern  . Not on file   Social History Narrative  . No narrative on file    Current Outpatient Prescriptions on File Prior to Visit  Medication Sig Dispense Refill  . Cyanocobalamin (VITAMIN B-12 CR PO) Take 1 tablet by mouth daily.      . fluticasone (FLONASE) 50 MCG/ACT nasal spray Place 2 sprays into the nose daily.  16 g  6  . Green Tea, Camillia sinensis, (GREEN TEA PO) Take 2 capsules by mouth daily.      . Miconazole Nitrate 2 % LOTN Apply to lower legs daily after shower for 30 days.  56 g  1  . Multiple Vitamin (MULTIVITAMIN) tablet Take 1 tablet by mouth daily.      . sildenafil (VIAGRA) 100 MG tablet Take 0.5-1 tablets (50-100 mg total) by mouth daily as needed for erectile dysfunction.  6 tablet  1  . sodium chloride (AYR) 0.65 % nasal spray Place 2 sprays  into the nose as needed for congestion.  30 mL  12  . vitamin C (ASCORBIC ACID) 500 MG tablet Take 500 mg by mouth daily.      Marland Kitchen VITAMIN D, ERGOCALCIFEROL, PO Take 1 capsule by mouth daily.      . Vitamins C E (CRANBERRY CONCENTRATE PO) Take 1 tablet by mouth daily.       No current facility-administered medications on file prior to visit.    No Known Allergies  Review of Systems  Review of Systems  Constitutional: Negative for fever and malaise/fatigue.  HENT: Negative for congestion.   Eyes: Negative for discharge.  Respiratory: Negative for shortness of breath.   Cardiovascular: Negative for chest pain, palpitations and leg swelling.  Gastrointestinal: Negative for nausea, abdominal pain and diarrhea.  Genitourinary: Negative for dysuria.  Musculoskeletal: Negative for falls.  Skin: Negative for rash.  Neurological: Negative for loss of consciousness and headaches.  Endo/Heme/Allergies: Negative for polydipsia.   Psychiatric/Behavioral: Negative for depression and suicidal ideas. The patient is not nervous/anxious and does not have insomnia.     Objective  BP 148/100  Pulse 65  Temp(Src) 97.9 F (36.6 C) (Oral)  Ht 6\' 2"  (1.88 m)  Wt 264 lb 1.3 oz (119.786 kg)  BMI 33.89 kg/m2  SpO2 96%  Physical Exam  Physical Exam  Constitutional: He is oriented to person, place, and time and well-developed, well-nourished, and in no distress. No distress.  HENT:  Head: Normocephalic and atraumatic.  Eyes: Conjunctivae are normal.  Neck: Neck supple. No thyromegaly present.  Cardiovascular: Normal rate, regular rhythm and normal heart sounds.   No murmur heard. Pulmonary/Chest: Effort normal and breath sounds normal. No respiratory distress.  Abdominal: He exhibits no distension and no mass. There is no tenderness.  Musculoskeletal: He exhibits no edema.  Contracture at DIP joint of 4th finger on right hand  Neurological: He is alert and oriented to person, place, and time.  Skin: Skin is warm.  Psychiatric: Memory, affect and judgment normal.    Lab Results  Component Value Date   TSH 0.98 09/12/2012   Lab Results  Component Value Date   WBC 9.2 09/12/2012   HGB 15.2 09/12/2012   HCT 45.7 09/12/2012   MCV 91.1 09/12/2012   PLT 238.0 09/12/2012   Lab Results  Component Value Date   CREATININE 1.0 09/12/2012   BUN 17 09/12/2012   NA 139 09/12/2012   K 3.9 09/12/2012   CL 103 09/12/2012   CO2 27 09/12/2012   Lab Results  Component Value Date   ALT 50 09/12/2012   AST 32 09/12/2012   ALKPHOS 50 09/12/2012   BILITOT 1.2 09/12/2012   Lab Results  Component Value Date   CHOL 181 09/12/2012   Lab Results  Component Value Date   HDL 36.30* 09/12/2012   Lab Results  Component Value Date   LDLCALC 109* 09/12/2012   Lab Results  Component Value Date   TRIG 178.0* 09/12/2012   Lab Results  Component Value Date   CHOLHDL 5 09/12/2012     Assessment & Plan  BPH (benign prostatic  hyperplasia) Asymptomatic with previous extensive work up including biopsies. Mildly elevated PSA last year will repat with lab work next month  Hypertension Elevated but has been off of his Norvasc, refill is sent to pharmacy and we will recheck at next visit. Given paperwork on DASH diet  Overweight(278.02) Given handout on DASH diet and encouraged ongoing exercise.  Arthritis Notes contracture of end  of right 4th finger at DIP joint, no heat or redness. Does have scar at base of finger c/w old trauma. No pain, try Aspercreme daily to finger and reassess at next visit

## 2013-09-06 NOTE — Patient Instructions (Addendum)

## 2013-09-06 NOTE — Assessment & Plan Note (Signed)
Elevated but has been off of his Norvasc, refill is sent to pharmacy and we will recheck at next visit. Given paperwork on DASH diet

## 2013-09-06 NOTE — Assessment & Plan Note (Signed)
Notes contracture of end of right 4th finger at DIP joint, no heat or redness. Does have scar at base of finger c/w old trauma. No pain, try Aspercreme daily to finger and reassess at next visit

## 2013-09-06 NOTE — Assessment & Plan Note (Signed)
Asymptomatic with previous extensive work up including biopsies. Mildly elevated PSA last year will repat with lab work next month

## 2013-09-06 NOTE — Telephone Encounter (Signed)
4-8 weeks for next appt annual exam with labs prior (after 9/18) Psa, lipid, renal, cbc, tsh, hepatic, urinalysis   Patient has appointment 10/25/13 and will be going to [facility] lab

## 2013-09-06 NOTE — Assessment & Plan Note (Signed)
Given handout on DASH diet and encouraged ongoing exercise.

## 2013-10-09 LAB — CBC
MCH: 29.3 pg (ref 26.0–34.0)
MCV: 84.4 fL (ref 78.0–100.0)
Platelets: 260 10*3/uL (ref 150–400)
RDW: 14.1 % (ref 11.5–15.5)
WBC: 7.6 10*3/uL (ref 4.0–10.5)

## 2013-10-10 LAB — TSH: TSH: 1.269 u[IU]/mL (ref 0.350–4.500)

## 2013-10-10 LAB — RENAL FUNCTION PANEL
Albumin: 4.5 g/dL (ref 3.5–5.2)
CO2: 24 mEq/L (ref 19–32)
Calcium: 9.2 mg/dL (ref 8.4–10.5)
Creat: 0.86 mg/dL (ref 0.50–1.35)

## 2013-10-10 LAB — HEPATIC FUNCTION PANEL
ALT: 38 U/L (ref 0–53)
Bilirubin, Direct: 0.2 mg/dL (ref 0.0–0.3)
Total Protein: 7.3 g/dL (ref 6.0–8.3)

## 2013-10-10 LAB — LIPID PANEL
HDL: 39 mg/dL — ABNORMAL LOW (ref >39)
LDL Cholesterol: 96 mg/dL (ref 0–99)
Total CHOL/HDL Ratio: 4.5 Ratio
Triglycerides: 206 mg/dL — ABNORMAL HIGH (ref <150)

## 2013-10-11 LAB — CULTURE, URINE COMPREHENSIVE
Colony Count: NO GROWTH
Organism ID, Bacteria: NO GROWTH

## 2013-10-25 ENCOUNTER — Encounter: Payer: BC Managed Care – PPO | Admitting: Family Medicine

## 2013-12-05 ENCOUNTER — Telehealth: Payer: Self-pay | Admitting: Family Medicine

## 2013-12-05 NOTE — Telephone Encounter (Signed)
Calling regarding life insurance paperwork, request call back

## 2013-12-05 NOTE — Telephone Encounter (Signed)
Have you seen any paperwork for this patient? All mine is done and I didn't have it?

## 2013-12-05 NOTE — Telephone Encounter (Signed)
I do not remember ever seeing anything but there is a whole pile from the week I have not seen

## 2013-12-06 NOTE — Telephone Encounter (Signed)
Pt is going to refax

## 2013-12-20 ENCOUNTER — Ambulatory Visit (INDEPENDENT_AMBULATORY_CARE_PROVIDER_SITE_OTHER): Payer: BC Managed Care – PPO | Admitting: Family Medicine

## 2013-12-20 ENCOUNTER — Encounter: Payer: Self-pay | Admitting: Family Medicine

## 2013-12-20 VITALS — BP 142/98 | HR 79 | Temp 97.8°F | Ht 74.0 in | Wt 265.0 lb

## 2013-12-20 DIAGNOSIS — R739 Hyperglycemia, unspecified: Secondary | ICD-10-CM

## 2013-12-20 DIAGNOSIS — I10 Essential (primary) hypertension: Secondary | ICD-10-CM

## 2013-12-20 DIAGNOSIS — E785 Hyperlipidemia, unspecified: Secondary | ICD-10-CM

## 2013-12-20 DIAGNOSIS — R972 Elevated prostate specific antigen [PSA]: Secondary | ICD-10-CM

## 2013-12-20 DIAGNOSIS — R7309 Other abnormal glucose: Secondary | ICD-10-CM

## 2013-12-20 DIAGNOSIS — N529 Male erectile dysfunction, unspecified: Secondary | ICD-10-CM

## 2013-12-20 DIAGNOSIS — E663 Overweight: Secondary | ICD-10-CM

## 2013-12-20 DIAGNOSIS — Z23 Encounter for immunization: Secondary | ICD-10-CM

## 2013-12-20 LAB — HEMOGLOBIN A1C
Hgb A1c MFr Bld: 6 % — ABNORMAL HIGH (ref <5.7)
Mean Plasma Glucose: 126 mg/dL — ABNORMAL HIGH (ref <117)

## 2013-12-20 LAB — LIPID PANEL
HDL: 37 mg/dL — ABNORMAL LOW (ref >39)
LDL Cholesterol: 75 mg/dL (ref 0–99)
VLDL: 61 mg/dL — ABNORMAL HIGH (ref 0–40)

## 2013-12-20 LAB — RENAL FUNCTION PANEL
Albumin: 4.6 g/dL (ref 3.5–5.2)
CO2: 28 mEq/L (ref 19–32)
Chloride: 99 mEq/L (ref 96–112)
Phosphorus: 4.3 mg/dL (ref 2.3–4.6)
Potassium: 4.1 mEq/L (ref 3.5–5.3)
Sodium: 135 mEq/L (ref 135–145)

## 2013-12-20 LAB — HEPATIC FUNCTION PANEL
AST: 35 U/L (ref 0–37)
Bilirubin, Direct: 0.1 mg/dL (ref 0.0–0.3)
Indirect Bilirubin: 0.6 mg/dL (ref 0.0–0.9)
Total Bilirubin: 0.7 mg/dL (ref 0.3–1.2)

## 2013-12-20 MED ORDER — SILDENAFIL CITRATE 100 MG PO TABS: 50.0000 mg | ORAL_TABLET | Freq: Every day | ORAL | Status: DC | PRN

## 2013-12-20 NOTE — Patient Instructions (Signed)
Digestive Advantage probiotics daily MegaRed krill oil caps daily by Schiff  DASH Diet The DASH diet stands for "Dietary Approaches to Stop Hypertension." It is a healthy eating plan that has been shown to reduce high blood pressure (hypertension) in as little as 14 days, while also possibly providing other significant health benefits. These other health benefits include reducing the risk of breast cancer after menopause and reducing the risk of type 2 diabetes, heart disease, colon cancer, and stroke. Health benefits also include weight loss and slowing kidney failure in patients with chronic kidney disease.  DIET GUIDELINES  Limit salt (sodium). Your diet should contain less than 1500 mg of sodium daily.  Limit refined or processed carbohydrates. Your diet should include mostly whole grains. Desserts and added sugars should be used sparingly.  Include small amounts of heart-healthy fats. These types of fats include nuts, oils, and tub margarine. Limit saturated and trans fats. These fats have been shown to be harmful in the body. CHOOSING FOODS  The following food groups are based on a 2000 calorie diet. See your Registered Dietitian for individual calorie needs. Grains and Grain Products (6 to 8 servings daily)  Eat More Often: Whole-wheat bread, brown rice, whole-grain or wheat pasta, quinoa, popcorn without added fat or salt (air popped).  Eat Less Often: White bread, white pasta, white rice, cornbread. Vegetables (4 to 5 servings daily)  Eat More Often: Fresh, frozen, and canned vegetables. Vegetables may be raw, steamed, roasted, or grilled with a minimal amount of fat.  Eat Less Often/Avoid: Creamed or fried vegetables. Vegetables in a cheese sauce. Fruit (4 to 5 servings daily)  Eat More Often: All fresh, canned (in natural juice), or frozen fruits. Dried fruits without added sugar. One hundred percent fruit juice ( cup [237 mL] daily).  Eat Less Often: Dried fruits with added  sugar. Canned fruit in light or heavy syrup. [employer], Fish, and Poultry (2 servings or less daily. One serving is 3 to 4 oz [85-114 g]).  Eat More Often: Ninety percent or leaner ground beef, tenderloin, sirloin. Round cuts of beef, chicken breast, [place] breast. All fish. Grill, bake, or broil your meat. Nothing should be fried.  Eat Less Often/Avoid: Fatty cuts of meat, [place], or chicken leg, thigh, or wing. Fried cuts of meat or fish. Dairy (2 to 3 servings)  Eat More Often: Low-fat or fat-free milk, low-fat plain or light yogurt, reduced-fat or part-skim cheese.  Eat Less Often/Avoid: Milk (whole, 2%).Whole milk yogurt. Full-fat cheeses. Nuts, Seeds, and Legumes (4 to 5 servings per week)  Eat More Often: All without added salt.  Eat Less Often/Avoid: Salted nuts and seeds, canned beans with added salt. Fats and Sweets (limited)  Eat More Often: Vegetable oils, tub margarines without trans fats, sugar-free gelatin. Mayonnaise and salad dressings.  Eat Less Often/Avoid: Coconut oils, palm oils, butter, stick margarine, cream, half and half, cookies, candy, pie. FOR MORE INFORMATION The Dash Diet Eating Plan: www.dashdiet.org Document Released: 12/01/2011 Document Revised: 03/05/2012 Document Reviewed: 12/01/2011 [facility] Patient Information 2014 [place], [place].

## 2013-12-20 NOTE — Progress Notes (Signed)
Pre visit review using our clinic review tool, if applicable. No additional management support is needed unless otherwise documented below in the visit note. 

## 2013-12-20 NOTE — Progress Notes (Signed)
Patient ID: Joseph Fowler, male   DOB: [birth year], 57 y.o.   MRN: [id number] Joseph Fowler [id number] [birth year] 12/20/2013      Progress Note-Follow Up  Subjective  Chief Complaint  Chief Complaint  Patient presents with  . Follow-up    from lab work  . Injections    prevnar    HPI   patient is a 57 year old Caucasian male who is in today for followup. Etiology she has not been exercising or eating right. No recent illness. Denies chest pain or palpitations. No shortness of breath GI or GU concerns at this time.  Past Medical History  Diagnosis Date  . Chicken pox as a child  . Hypertension 62 yrs old  . ED (erectile dysfunction) 09/12/2012  . BPH (benign prostatic hyperplasia) 09/12/2012  . Preventative health care 09/12/2012  . Overweight(278.02) 09/12/2012  . Dermatitis 09/12/2012  . Nocturia 09/12/2012  . Allergy   . Snoring 09/12/2012  . Blood in stool 12/03/2012  . Arthritis 09/06/2013    Past Surgical History  Procedure Laterality Date  . Appendectomy  57 yrs old  . Vasectomy  57 yrs old  . Torn meninscus  2011    knee- right  . Eye surgery  2005    ,right, injury, decreased visual acuity, photophobia  . Prostate biopsy      Family History  Problem Relation Age of Onset  . Diabetes Father     type 2  . Other Brother     whole in heart  . Heart disease Brother     sudden cardiac death  . Stroke Maternal Grandmother     X 2  . Arthritis Paternal Uncle     History   Social History  . Marital Status: Married    Spouse Name: N/A    Number of Children: N/A  . Years of Education: N/A   Occupational History  . Not on file.   Social History Main Topics  . Smoking status: Former Smoker -- 2.00 packs/day for 8 years    Types: Cigarettes    Quit date: 12/26/1981  . Smokeless tobacco: Never Used  . Alcohol Use: Yes     Comment: occasional  . Drug Use: No  . Sexual Activity: Yes   Other Topics Concern  . Not on file   Social History Narrative  . No  narrative on file    Current Outpatient Prescriptions on File Prior to Visit  Medication Sig Dispense Refill  . amLODipine (NORVASC) 5 MG tablet Take 1 tablet (5 mg total) by mouth daily.  90 tablet  1  . Cyanocobalamin (VITAMIN B-12 CR PO) Take 1 tablet by mouth daily.      . fluticasone (FLONASE) 50 MCG/ACT nasal spray Place 2 sprays into the nose daily.  16 g  6  . Green Tea, Camillia sinensis, (GREEN TEA PO) Take 2 capsules by mouth daily.      Marland Kitchen loratadine (CLARITIN) 10 MG tablet Take 1 tablet (10 mg total) by mouth daily.  90 tablet  3  . losartan-hydrochlorothiazide (HYZAAR) 50-12.5 MG per tablet Take 1 tablet by mouth daily.  90 tablet  1  . Miconazole Nitrate 2 % LOTN Apply to lower legs daily after shower for 30 days.  56 g  1  . Multiple Vitamin (MULTIVITAMIN) tablet Take 1 tablet by mouth daily.      . sildenafil (VIAGRA) 100 MG tablet Take 0.5-1 tablets (50-100 mg total) by mouth daily as needed for erectile  dysfunction.  6 tablet  1  . sodium chloride (AYR) 0.65 % nasal spray Place 2 sprays into the nose as needed for congestion.  30 mL  12  . vitamin C (ASCORBIC ACID) 500 MG tablet Take 500 mg by mouth daily.      Marland Kitchen VITAMIN D, ERGOCALCIFEROL, PO Take 1 capsule by mouth daily.      . Vitamins C E (CRANBERRY CONCENTRATE PO) Take 1 tablet by mouth daily.       No current facility-administered medications on file prior to visit.    No Known Allergies  Review of Systems  Review of Systems  Constitutional: Negative for fever and malaise/fatigue.  HENT: Negative for congestion.   Eyes: Negative for discharge.  Respiratory: Negative for shortness of breath.   Cardiovascular: Negative for chest pain, palpitations and leg swelling.  Gastrointestinal: Negative for nausea, abdominal pain and diarrhea.  Genitourinary: Negative for dysuria.  Musculoskeletal: Negative for falls.  Skin: Negative for rash.  Neurological: Negative for loss of consciousness and headaches.   Endo/Heme/Allergies: Negative for polydipsia.  Psychiatric/Behavioral: Negative for depression and suicidal ideas. The patient is not nervous/anxious and does not have insomnia.     Objective  BP 142/98  Pulse 79  Temp(Src) 97.8 F (36.6 C) (Oral)  Ht 6\' 2"  (1.88 m)  Wt 265 lb (120.203 kg)  BMI 34.01 kg/m2  SpO2 95%  Physical Exam  Physical Exam  Constitutional: He is oriented to person, place, and time and well-developed, well-nourished, and in no distress. No distress.  HENT:  Head: Normocephalic and atraumatic.  Eyes: Conjunctivae are normal.  Neck: Neck supple. No thyromegaly present.  Cardiovascular: Normal rate, regular rhythm and normal heart sounds.   No murmur heard. Pulmonary/Chest: Effort normal and breath sounds normal. No respiratory distress.  Abdominal: He exhibits no distension and no mass. There is no tenderness.  Musculoskeletal: He exhibits no edema.  Neurological: He is alert and oriented to person, place, and time.  Skin: Skin is warm.  Psychiatric: Memory, affect and judgment normal.    Lab Results  Component Value Date   TSH 1.269 10/09/2013   Lab Results  Component Value Date   WBC 7.6 10/09/2013   HGB 15.2 10/09/2013   HCT 43.7 10/09/2013   MCV 84.4 10/09/2013   PLT 260 10/09/2013   Lab Results  Component Value Date   CREATININE 0.86 10/09/2013   BUN 12 10/09/2013   NA 136 10/09/2013   K 3.7 10/09/2013   CL 100 10/09/2013   CO2 24 10/09/2013   Lab Results  Component Value Date   ALT 38 10/09/2013   AST 27 10/09/2013   ALKPHOS 48 10/09/2013   BILITOT 1.1 10/09/2013   Lab Results  Component Value Date   CHOL 176 10/09/2013   Lab Results  Component Value Date   HDL 39* 10/09/2013   Lab Results  Component Value Date   LDLCALC 96 10/09/2013   Lab Results  Component Value Date   TRIG 206* 10/09/2013   Lab Results  Component Value Date   CHOLHDL 4.5 10/09/2013     Assessment & Plan   Hypertension Adequate  control, encouraged dash diet  Elevated PSA Referred to urology for further consideration. Has had biopsies in the past but is having some symptoms as well  Hyperglycemia Hemoglobin A1c 6.0. Minimize simple carbs. At some modest weight loss. Increase exercise  Other and unspecified hyperlipidemia Worsening. Encouraged increased exercise and weight loss. Avoid transplants. Add Krill oil. Encouraged to  start atorvastatin.

## 2013-12-22 DIAGNOSIS — E1169 Type 2 diabetes mellitus with other specified complication: Secondary | ICD-10-CM | POA: Insufficient documentation

## 2013-12-22 DIAGNOSIS — R739 Hyperglycemia, unspecified: Secondary | ICD-10-CM | POA: Insufficient documentation

## 2013-12-22 DIAGNOSIS — C61 Malignant neoplasm of prostate: Secondary | ICD-10-CM | POA: Insufficient documentation

## 2013-12-22 DIAGNOSIS — E782 Mixed hyperlipidemia: Secondary | ICD-10-CM | POA: Insufficient documentation

## 2013-12-22 NOTE — Assessment & Plan Note (Signed)
Referred to urology for further consideration. Has had biopsies in the past but is having some symptoms as well

## 2013-12-22 NOTE — Assessment & Plan Note (Signed)
Adequate control, encouraged dash diet

## 2013-12-22 NOTE — Assessment & Plan Note (Signed)
Hemoglobin A1c 6.0. Minimize simple carbs. At some modest weight loss. Increase exercise

## 2013-12-22 NOTE — Assessment & Plan Note (Signed)
Worsening. Encouraged increased exercise and weight loss. Avoid transplants. Add Krill oil. Encouraged to start atorvastatin.

## 2013-12-23 ENCOUNTER — Telehealth: Payer: Self-pay | Admitting: *Deleted

## 2013-12-23 MED ORDER — ATORVASTATIN CALCIUM 10 MG PO TABS: 10.0000 mg | ORAL_TABLET | Freq: Every day | ORAL | Status: DC

## 2013-12-23 NOTE — Telephone Encounter (Signed)
Message copied by Kathi Simpers on Mon Dec 23, 2013 11:38 AM ------      Message from: Danise Edge A      Created: Sun Dec 22, 2013 12:33 PM       Notify normal labs except sugar marker hgba1c is up slightly at 6.0 but not diagnostic of diabetes. He needs to decrease carbs and increase exercise, also his cholesterol is up he should start Lipitor 10 mg dailly, disp #30 with 3 rf ------

## 2013-12-23 NOTE — Telephone Encounter (Signed)
Notified pt and he voices understanding. Rx sent to [facility] per pt request.

## 2014-03-18 ENCOUNTER — Other Ambulatory Visit: Payer: Self-pay

## 2014-03-18 DIAGNOSIS — I10 Essential (primary) hypertension: Secondary | ICD-10-CM

## 2014-03-18 MED ORDER — LOSARTAN POTASSIUM-HCTZ 50-12.5 MG PO TABS: 1.0000 | ORAL_TABLET | Freq: Every day | ORAL | Status: DC

## 2014-03-18 MED ORDER — AMLODIPINE BESYLATE 5 MG PO TABS: 5.0000 mg | ORAL_TABLET | Freq: Every day | ORAL | Status: DC

## 2014-04-15 ENCOUNTER — Encounter: Payer: Self-pay | Admitting: Family Medicine

## 2014-04-15 ENCOUNTER — Ambulatory Visit (INDEPENDENT_AMBULATORY_CARE_PROVIDER_SITE_OTHER): Payer: BC Managed Care – PPO | Admitting: Family Medicine

## 2014-04-15 VITALS — BP 152/100 | HR 73 | Temp 97.5°F | Ht 74.0 in | Wt 261.0 lb

## 2014-04-15 DIAGNOSIS — L919 Hypertrophic disorder of the skin, unspecified: Secondary | ICD-10-CM

## 2014-04-15 DIAGNOSIS — Z1211 Encounter for screening for malignant neoplasm of colon: Secondary | ICD-10-CM

## 2014-04-15 DIAGNOSIS — R972 Elevated prostate specific antigen [PSA]: Secondary | ICD-10-CM

## 2014-04-15 DIAGNOSIS — N649 Disorder of breast, unspecified: Secondary | ICD-10-CM

## 2014-04-15 DIAGNOSIS — E663 Overweight: Secondary | ICD-10-CM

## 2014-04-15 DIAGNOSIS — L909 Atrophic disorder of skin, unspecified: Secondary | ICD-10-CM

## 2014-04-15 DIAGNOSIS — Z0000 Encounter for general adult medical examination without abnormal findings: Secondary | ICD-10-CM

## 2014-04-15 DIAGNOSIS — I10 Essential (primary) hypertension: Secondary | ICD-10-CM

## 2014-04-15 DIAGNOSIS — E785 Hyperlipidemia, unspecified: Secondary | ICD-10-CM

## 2014-04-15 DIAGNOSIS — L918 Other hypertrophic disorders of the skin: Secondary | ICD-10-CM

## 2014-04-15 MED ORDER — ATORVASTATIN CALCIUM 10 MG PO TABS: 10.0000 mg | ORAL_TABLET | Freq: Every day | ORAL | Status: DC

## 2014-04-15 MED ORDER — LOSARTAN POTASSIUM-HCTZ 100-25 MG PO TABS: 1.0000 | ORAL_TABLET | Freq: Every day | ORAL | Status: DC

## 2014-04-15 NOTE — Progress Notes (Signed)
Pre visit review using our clinic review tool, if applicable. No additional management support is needed unless otherwise documented below in the visit note. 

## 2014-04-15 NOTE — Patient Instructions (Signed)
[facility] [place] vs [facility] cost for colonoscopy for screening purposes

## 2014-04-16 ENCOUNTER — Other Ambulatory Visit: Payer: Self-pay | Admitting: Family Medicine

## 2014-04-16 DIAGNOSIS — N649 Disorder of breast, unspecified: Secondary | ICD-10-CM

## 2014-04-20 ENCOUNTER — Encounter: Payer: Self-pay | Admitting: Family Medicine

## 2014-04-20 DIAGNOSIS — N62 Hypertrophy of breast: Secondary | ICD-10-CM | POA: Insufficient documentation

## 2014-04-20 DIAGNOSIS — L918 Other hypertrophic disorders of the skin: Secondary | ICD-10-CM | POA: Insufficient documentation

## 2014-04-20 NOTE — Assessment & Plan Note (Signed)
On right, soft, mobile mildly tender and has been around several weeks referred for ultrasound and possible MGM as needed

## 2014-04-20 NOTE — Assessment & Plan Note (Signed)
Right eyelid, has become large enough to obscure field of vision. Referred to plastics for possible removal on left

## 2014-04-20 NOTE — Assessment & Plan Note (Signed)
Encouraged heart healthy diet, increase exercise, avoid trans fats, consider a krill oil cap daily 

## 2014-04-20 NOTE — Assessment & Plan Note (Signed)
Referred for screening colonoscopy 

## 2014-04-20 NOTE — Progress Notes (Signed)
Patient ID: Joseph Fowler, male   DOB: 09-Mar-1956, 58 y.o.   MRN: [id number] Joseph Fowler [id number] 01/08/1956 04/20/2014      Progress Note-Follow Up  Subjective  Chief Complaint  Chief Complaint  Patient presents with  . Follow-up    BP    HPI  Patient is a 58 year old male in today for routine medical care. With several complaints. He has a skin tag on his left eyelid which is becoming large enough to obscure his vision. He notes his blood pressure is 1. Is struggling with some polyuria but denies any hematuria, dysuria, fevers, back pain, abdominal pain . Denies CP/palp/SOB/HA/congestion/fevers/GI or GU c/o. Taking meds as prescribed Past Medical History  Diagnosis Date  . Chicken pox as a child  . Hypertension 45 yrs old  . ED (erectile dysfunction) 09/12/2012  . BPH (benign prostatic hyperplasia) 09/12/2012  . Preventative health care 09/12/2012  . Overweight 09/12/2012  . Dermatitis 09/12/2012  . Nocturia 09/12/2012  . Allergy   . Snoring 09/12/2012  . Blood in stool 12/03/2012  . Arthritis 09/06/2013    Past Surgical History  Procedure Laterality Date  . Appendectomy  58 yrs old  . Vasectomy  58 yrs old  . Torn meninscus  2011    knee- right  . Eye surgery  2005    ,right, injury, decreased visual acuity, photophobia  . Prostate biopsy      Family History  Problem Relation Age of Onset  . Diabetes Father     type 2  . Other Brother     whole in heart  . Heart disease Brother     sudden cardiac death  . Stroke Maternal Grandmother     X 2  . Arthritis Paternal Uncle     History   Social History  . Marital Status: Married    Spouse Name: N/A    Number of Children: N/A  . Years of Education: N/A   Occupational History  . Not on file.   Social History Main Topics  . Smoking status: Former Smoker -- 2.00 packs/day for 8 years    Types: Cigarettes    Quit date: 12/26/1981  . Smokeless tobacco: Never Used  . Alcohol Use: Yes     Comment: occasional   . Drug Use: No  . Sexual Activity: Yes   Other Topics Concern  . Not on file   Social History Narrative  . No narrative on file    Current Outpatient Prescriptions on File Prior to Visit  Medication Sig Dispense Refill  . amLODipine (NORVASC) 5 MG tablet Take 1 tablet (5 mg total) by mouth daily.  90 tablet  1  . Cyanocobalamin (VITAMIN B-12 CR PO) Take 1 tablet by mouth daily.      . fluticasone (FLONASE) 50 MCG/ACT nasal spray Place 2 sprays into the nose daily.  16 g  6  . Green Tea, Camillia sinensis, (GREEN TEA PO) Take 2 capsules by mouth daily.      Marland Kitchen loratadine (CLARITIN) 10 MG tablet Take 1 tablet (10 mg total) by mouth daily.  90 tablet  3  . Miconazole Nitrate 2 % LOTN Apply to lower legs daily after shower for 30 days.  56 g  1  . Multiple Vitamin (MULTIVITAMIN) tablet Take 1 tablet by mouth daily.      . sildenafil (VIAGRA) 100 MG tablet Take 0.5-1 tablets (50-100 mg total) by mouth daily as needed for erectile dysfunction.  6 tablet  5  . sodium chloride (AYR) 0.65 % nasal spray Place 2 sprays into the nose as needed for congestion.  30 mL  12  . vitamin C (ASCORBIC ACID) 500 MG tablet Take 500 mg by mouth daily.      Marland Kitchen VITAMIN D, ERGOCALCIFEROL, PO Take 1 capsule by mouth daily.      . Vitamins C E (CRANBERRY CONCENTRATE PO) Take 1 tablet by mouth daily.       No current facility-administered medications on file prior to visit.    No Known Allergies  Review of Systems  Review of Systems  Constitutional: Negative for fever and malaise/fatigue.  HENT: Negative for congestion.   Eyes: Negative for discharge.  Respiratory: Negative for shortness of breath.   Cardiovascular: Negative for chest pain, palpitations and leg swelling.  Gastrointestinal: Negative for nausea, abdominal pain and diarrhea.  Genitourinary: Negative for dysuria.  Musculoskeletal: Negative for falls.  Skin: Negative for rash.  Neurological: Negative for loss of consciousness and headaches.   Endo/Heme/Allergies: Negative for polydipsia.  Psychiatric/Behavioral: Negative for depression and suicidal ideas. The patient is not nervous/anxious and does not have insomnia.     Objective  BP 152/100  Pulse 73  Temp(Src) 97.5 F (36.4 C) (Oral)  Ht 6\' 2"  (1.88 m)  Wt 261 lb (118.389 kg)  BMI 33.50 kg/m2  SpO2 97%  Physical Exam  Physical Exam  Constitutional: He is oriented to person, place, and time and well-developed, well-nourished, and in no distress. No distress.  HENT:  Head: Normocephalic and atraumatic.  Eyes: Conjunctivae are normal.  Neck: Neck supple. No thyromegaly present.  Cardiovascular: Normal rate, regular rhythm and normal heart sounds.   No murmur heard. Pulmonary/Chest: Effort normal and breath sounds normal. No respiratory distress.  Abdominal: He exhibits no distension and no mass. There is no tenderness.  Musculoskeletal: He exhibits no edema.  Neurological: He is alert and oriented to person, place, and time.  Skin: Skin is warm.  Psychiatric: Memory, affect and judgment normal.    Lab Results  Component Value Date   TSH 1.269 10/09/2013   Lab Results  Component Value Date   WBC 7.6 10/09/2013   HGB 15.2 10/09/2013   HCT 43.7 10/09/2013   MCV 84.4 10/09/2013   PLT 260 10/09/2013   Lab Results  Component Value Date   CREATININE 0.95 12/20/2013   BUN 18 12/20/2013   NA 135 12/20/2013   K 4.1 12/20/2013   CL 99 12/20/2013   CO2 28 12/20/2013   Lab Results  Component Value Date   ALT 41 12/20/2013   AST 35 12/20/2013   ALKPHOS 48 12/20/2013   BILITOT 0.7 12/20/2013   Lab Results  Component Value Date   CHOL 173 12/20/2013   Lab Results  Component Value Date   HDL 37* 12/20/2013   Lab Results  Component Value Date   LDLCALC 75 12/20/2013   Lab Results  Component Value Date   TRIG 303* 12/20/2013   Lab Results  Component Value Date   CHOLHDL 4.7 12/20/2013     Assessment & Plan  Hypertension Poorly  controlled will alter medications, encouraged DASH diet, minimize caffeine and obtain adequate sleep. Report concerning symptoms and follow up as directed and as needed. Will double Losartanhct  Overweight Encouraged DASH diet, decrease po intake and increase exercise as tolerated. Needs 7-8 hours of sleep nightly. Avoid trans fats, eat small, frequent meals every 4-5 hours with lean proteins, complex carbs and healthy fats. Minimize simple  carbs, GMO foods.  Skin tag Right eyelid, has become large enough to obscure field of vision. Referred to plastics for possible removal on left  Other and unspecified hyperlipidemia Encouraged heart healthy diet, increase exercise, avoid trans fats, consider a krill oil cap daily  Preventative health care Referred for screening colonoscopy  Breast lesion On right, soft, mobile mildly tender and has been around several weeks referred for ultrasound and possible MGM as needed

## 2014-04-20 NOTE — Assessment & Plan Note (Signed)
Poorly controlled will alter medications, encouraged DASH diet, minimize caffeine and obtain adequate sleep. Report concerning symptoms and follow up as directed and as needed. Will double Losartanhct

## 2014-04-20 NOTE — Assessment & Plan Note (Signed)
Encouraged DASH diet, decrease po intake and increase exercise as tolerated. Needs 7-8 hours of sleep nightly. Avoid trans fats, eat small, frequent meals every 4-5 hours with lean proteins, complex carbs and healthy fats. Minimize simple carbs, GMO foods. 

## 2014-04-23 ENCOUNTER — Other Ambulatory Visit: Payer: BC Managed Care – PPO

## 2014-04-30 ENCOUNTER — Ambulatory Visit
Admission: RE | Admit: 2014-04-30 | Discharge: 2014-04-30 | Disposition: A | Discharge status: Home / Self Care | Payer: BC Managed Care – PPO | Source: Ambulatory Visit | Attending: Family Medicine | Admitting: Family Medicine

## 2014-04-30 DIAGNOSIS — N649 Disorder of breast, unspecified: Secondary | ICD-10-CM

## 2014-05-16 ENCOUNTER — Encounter: Payer: Self-pay | Admitting: Family Medicine

## 2014-09-15 ENCOUNTER — Other Ambulatory Visit: Payer: Self-pay | Admitting: Family Medicine

## 2014-10-16 ENCOUNTER — Other Ambulatory Visit: Payer: Self-pay

## 2014-10-16 DIAGNOSIS — T7840XD Allergy, unspecified, subsequent encounter: Secondary | ICD-10-CM

## 2014-10-16 MED ORDER — LORATADINE 10 MG PO TABS: 10.0000 mg | ORAL_TABLET | Freq: Every day | ORAL | Status: AC

## 2014-12-20 ENCOUNTER — Telehealth: Payer: Self-pay | Admitting: Family Medicine

## 2014-12-22 NOTE — Telephone Encounter (Signed)
Caller name: Yepes,Jean Relation to pt: self  Call back number: [phone] [facility]acy: [facility] [phone]  Reason for call:  Spouse requesting a refill amLODipine (NORVASC) 5 MG tablet & losartan-hydrochlorothiazide (HYZAAR) 100-25 MG per tablet

## 2014-12-22 NOTE — Telephone Encounter (Signed)
Rx's sent to the pharmacy by e-script.//AB/CMA 

## 2015-01-22 ENCOUNTER — Telehealth: Payer: Self-pay | Admitting: Family Medicine

## 2015-01-22 DIAGNOSIS — E785 Hyperlipidemia, unspecified: Secondary | ICD-10-CM

## 2015-01-22 MED ORDER — ATORVASTATIN CALCIUM 10 MG PO TABS: 10.0000 mg | ORAL_TABLET | Freq: Every day | ORAL | Status: DC

## 2015-01-22 NOTE — Telephone Encounter (Signed)
Caller name: Koelzer,Jean Relation to pt: spouse  Call back number: [phone] Pharmacy:  [facility] [place], [place]. [phone] (Phone)   Reason for call:  Pt states insurance lapse and requesting samples of atorvastatin (LIPITOR) 10 MG tablet & losartan-hydrochlorothiazide (HYZAAR) 100-25 MG per tablet

## 2015-01-22 NOTE — Telephone Encounter (Signed)
Rx for Lipitor was approved with only #30,1 with note that the pt needs an office visit and labs.  The Losartan-HCTZ was filled on 02/22/14 #30,2.//AB/CMA

## 2015-02-09 ENCOUNTER — Telehealth: Payer: Self-pay | Admitting: Family Medicine

## 2015-02-09 ENCOUNTER — Other Ambulatory Visit: Payer: Self-pay | Admitting: Family Medicine

## 2015-02-09 DIAGNOSIS — N529 Male erectile dysfunction, unspecified: Secondary | ICD-10-CM

## 2015-02-09 NOTE — Telephone Encounter (Signed)
He can have one refill on the Viagra but no refills after that til seen.

## 2015-02-09 NOTE — Telephone Encounter (Signed)
Advise on refill as last refill was 12/20/2013

## 2015-02-10 ENCOUNTER — Other Ambulatory Visit: Payer: Self-pay | Admitting: Family Medicine

## 2015-02-10 MED ORDER — SILDENAFIL CITRATE 100 MG PO TABS: 50.0000 mg | ORAL_TABLET | Freq: Every day | ORAL | Status: DC | PRN

## 2015-02-10 NOTE — Telephone Encounter (Signed)
Refill done.  

## 2015-03-18 ENCOUNTER — Other Ambulatory Visit: Payer: Self-pay | Admitting: Family Medicine

## 2015-03-18 NOTE — Telephone Encounter (Signed)
Please advise pt was last seen 03/2014 and does not have an upcoming appt.

## 2015-03-18 NOTE — Telephone Encounter (Signed)
He can have a 30 d supply with 2 rf or 1  90 day supply on both but no more til seen so have him schedule a follow up apptje

## 2015-03-18 NOTE — Telephone Encounter (Signed)
meds filled per provider, letter mailed to pt to schedule an appt.

## 2015-04-20 ENCOUNTER — Other Ambulatory Visit: Payer: Self-pay | Admitting: Family Medicine

## 2015-04-22 ENCOUNTER — Telehealth: Payer: Self-pay | Admitting: Family Medicine

## 2015-04-22 MED ORDER — LOSARTAN POTASSIUM-HCTZ 100-25 MG PO TABS: ORAL_TABLET | ORAL | Status: DC

## 2015-04-22 NOTE — Telephone Encounter (Signed)
Relation to pt: self  Call back number:[phone] Pharmacy: [facility] [place], [place].   Reason for call:  Pt requesting a refill losartan-hydrochlorothiazide (HYZAAR) 100-25 MG per. Pt has a upcoming appt. 04/28/2015

## 2015-04-28 ENCOUNTER — Ambulatory Visit (INDEPENDENT_AMBULATORY_CARE_PROVIDER_SITE_OTHER): Payer: 59 | Admitting: Family Medicine

## 2015-04-28 ENCOUNTER — Encounter: Payer: Self-pay | Admitting: Family Medicine

## 2015-04-28 VITALS — BP 122/84 | HR 79 | Temp 98.4°F | Ht 74.0 in | Wt 259.0 lb

## 2015-04-28 DIAGNOSIS — E782 Mixed hyperlipidemia: Secondary | ICD-10-CM

## 2015-04-28 DIAGNOSIS — Z1211 Encounter for screening for malignant neoplasm of colon: Secondary | ICD-10-CM

## 2015-04-28 DIAGNOSIS — E785 Hyperlipidemia, unspecified: Secondary | ICD-10-CM

## 2015-04-28 DIAGNOSIS — E663 Overweight: Secondary | ICD-10-CM

## 2015-04-28 DIAGNOSIS — K921 Melena: Secondary | ICD-10-CM | POA: Diagnosis not present

## 2015-04-28 DIAGNOSIS — I10 Essential (primary) hypertension: Secondary | ICD-10-CM | POA: Diagnosis not present

## 2015-04-28 MED ORDER — LOSARTAN POTASSIUM-HCTZ 100-25 MG PO TABS: ORAL_TABLET | ORAL | Status: DC

## 2015-04-28 MED ORDER — AMLODIPINE BESYLATE 5 MG PO TABS: ORAL_TABLET | ORAL | Status: DC

## 2015-04-28 MED ORDER — ATORVASTATIN CALCIUM 10 MG PO TABS: 10.0000 mg | ORAL_TABLET | Freq: Every day | ORAL | Status: DC

## 2015-04-28 MED ORDER — SILDENAFIL CITRATE 100 MG PO TABS: 100.0000 mg | ORAL_TABLET | ORAL | Status: DC | PRN

## 2015-04-28 NOTE — Patient Instructions (Signed)

## 2015-04-28 NOTE — Progress Notes (Signed)
Pre visit review using our clinic review tool, if applicable. No additional management support is needed unless otherwise documented below in the visit note. 

## 2015-05-01 ENCOUNTER — Encounter: Payer: Self-pay | Admitting: Internal Medicine

## 2015-05-05 ENCOUNTER — Ambulatory Visit: Payer: 59 | Admitting: Family Medicine

## 2015-05-16 ENCOUNTER — Encounter: Payer: Self-pay | Admitting: Family Medicine

## 2015-05-16 NOTE — Assessment & Plan Note (Signed)
Encouraged DASH diet, decrease po intake and increase exercise as tolerated. Needs 7-8 hours of sleep nightly. Avoid trans fats, eat small, frequent meals every 4-5 hours with lean proteins, complex carbs and healthy fats. Minimize simple carbs, GMO foods. 

## 2015-05-16 NOTE — Assessment & Plan Note (Signed)
Well controlled, no changes to meds. Encouraged heart healthy diet such as the DASH diet and exercise as tolerated.  °

## 2015-05-16 NOTE — Assessment & Plan Note (Signed)
Tolerating statin, encouraged heart healthy diet, avoid trans fats, minimize simple carbs and saturated fats. Increase exercise as tolerated 

## 2015-05-16 NOTE — Assessment & Plan Note (Signed)
No recent episodes but does finally agree to referral to gastroenterology for screening colonoscopy

## 2015-05-16 NOTE — Progress Notes (Signed)
Joseph Fowler  [id number] 31-May-1956 05/16/2015      Progress Note-Follow Up  Subjective  Chief Complaint  Chief Complaint  Patient presents with  . Follow-up    blood pressure    HPI  Patient is a 59 y.o. male in today for routine medical care. Patient is in today for routine medical care and is doing fairly well. She's had no recent illness. He denies any acute concerns. He's had no change in bowel habits, bloody or tarry stool. He denies any anorexia, diaphoresis or new concerns. Denies CP/palp/SOB/HA/congestion/fevers/GI or GU c/o. Taking meds as prescribed  Past Medical History  Diagnosis Date  . Chicken pox as a child  . Hypertension 49 yrs old  . ED (erectile dysfunction) 09/12/2012  . BPH (benign prostatic hyperplasia) 09/12/2012  . Preventative health care 09/12/2012  . Overweight(278.02) 09/12/2012  . Dermatitis 09/12/2012  . Nocturia 09/12/2012  . Allergy   . Snoring 09/12/2012  . Blood in stool 12/03/2012  . Arthritis 09/06/2013    Past Surgical History  Procedure Laterality Date  . Appendectomy  59 yrs old  . Vasectomy  59 yrs old  . Torn meninscus  2011    knee- right  . Eye surgery  2005    ,right, injury, decreased visual acuity, photophobia  . Prostate biopsy      Family History  Problem Relation Age of Onset  . Diabetes Father     type 2  . Other Brother     whole in heart  . Heart disease Brother     sudden cardiac death  . Stroke Maternal Grandmother     X 2  . Arthritis Paternal Uncle     History   Social History  . Marital Status: Married    Spouse Name: N/A  . Number of Children: N/A  . Years of Education: N/A   Occupational History  . Not on file.   Social History Main Topics  . Smoking status: Former Smoker -- 2.00 packs/day for 8 years    Types: Cigarettes    Quit date: 12/26/1981  . Smokeless tobacco: Never Used  . Alcohol Use: Yes     Comment: occasional  . Drug Use: No  . Sexual Activity: Yes   Other Topics Concern    . Not on file   Social History Narrative    Current Outpatient Prescriptions on File Prior to Visit  Medication Sig Dispense Refill  . Cyanocobalamin (VITAMIN B-12 CR PO) Take 1 tablet by mouth daily.    . fluticasone (FLONASE) 50 MCG/ACT nasal spray Place 2 sprays into the nose daily. 16 g 6  . Green Tea, Camillia sinensis, (GREEN TEA PO) Take 2 capsules by mouth daily.    Marland Kitchen loratadine (CLARITIN) 10 MG tablet Take 1 tablet (10 mg total) by mouth daily. 90 tablet 1  . Miconazole Nitrate 2 % LOTN Apply to lower legs daily after shower for 30 days. 56 g 1  . Multiple Vitamin (MULTIVITAMIN) tablet Take 1 tablet by mouth daily.    . sodium chloride (AYR) 0.65 % nasal spray Place 2 sprays into the nose as needed for congestion. 30 mL 12  . vitamin C (ASCORBIC ACID) 500 MG tablet Take 500 mg by mouth daily.    Marland Kitchen VITAMIN D, ERGOCALCIFEROL, PO Take 1 capsule by mouth daily.    . Vitamins C E (CRANBERRY CONCENTRATE PO) Take 1 tablet by mouth daily.     No current facility-administered medications on file prior  to visit.    No Known Allergies  Review of Systems  Review of Systems  Constitutional: Negative for fever and malaise/fatigue.  HENT: Negative for congestion.   Eyes: Negative for discharge.  Respiratory: Negative for shortness of breath.   Cardiovascular: Negative for chest pain, palpitations and leg swelling.  Gastrointestinal: Negative for nausea, abdominal pain and diarrhea.  Genitourinary: Negative for dysuria.  Musculoskeletal: Negative for falls.  Skin: Negative for rash.  Neurological: Negative for loss of consciousness and headaches.  Endo/Heme/Allergies: Negative for polydipsia.  Psychiatric/Behavioral: Negative for depression and suicidal ideas. The patient is not nervous/anxious and does not have insomnia.     Objective  BP 122/84 mmHg  Pulse 79  Temp(Src) 98.4 F (36.9 C) (Oral)  Ht 6\' 2"  (1.88 m)  Wt 259 lb (117.482 kg)  BMI 33.24 kg/m2  SpO2  96%  Physical Exam  Physical Exam  Constitutional: He is oriented to person, place, and time and well-developed, well-nourished, and in no distress. No distress.  HENT:  Head: Normocephalic and atraumatic.  Eyes: Conjunctivae are normal.  Neck: Neck supple. No thyromegaly present.  Cardiovascular: Normal rate, regular rhythm and normal heart sounds.   No murmur heard. Pulmonary/Chest: Effort normal and breath sounds normal. No respiratory distress.  Abdominal: He exhibits no distension and no mass. There is no tenderness.  Musculoskeletal: He exhibits no edema.  Neurological: He is alert and oriented to person, place, and time.  Skin: Skin is warm.  Psychiatric: Memory, affect and judgment normal.    Lab Results  Component Value Date   TSH 1.269 10/09/2013   Lab Results  Component Value Date   WBC 7.6 10/09/2013   HGB 15.2 10/09/2013   HCT 43.7 10/09/2013   MCV 84.4 10/09/2013   PLT 260 10/09/2013   Lab Results  Component Value Date   CREATININE 0.95 12/20/2013   BUN 18 12/20/2013   NA 135 12/20/2013   K 4.1 12/20/2013   CL 99 12/20/2013   CO2 28 12/20/2013   Lab Results  Component Value Date   ALT 41 12/20/2013   AST 35 12/20/2013   ALKPHOS 48 12/20/2013   BILITOT 0.7 12/20/2013   Lab Results  Component Value Date   CHOL 173 12/20/2013   Lab Results  Component Value Date   HDL 37* 12/20/2013   Lab Results  Component Value Date   LDLCALC 75 12/20/2013   Lab Results  Component Value Date   TRIG 303* 12/20/2013   Lab Results  Component Value Date   CHOLHDL 4.7 12/20/2013     Assessment & Plan  Hypertension Well controlled, no changes to meds. Encouraged heart healthy diet such as the DASH diet and exercise as tolerated.    Blood in stool No recent episodes but does finally agree to referral to gastroenterology for screening colonoscopy   Hyperlipidemia, mixed Tolerating statin, encouraged heart healthy diet, avoid trans fats, minimize  simple carbs and saturated fats. Increase exercise as tolerated   Overweight Encouraged DASH diet, decrease po intake and increase exercise as tolerated. Needs 7-8 hours of sleep nightly. Avoid trans fats, eat small, frequent meals every 4-5 hours with lean proteins, complex carbs and healthy fats. Minimize simple carbs, GMO foods.

## 2015-07-08 ENCOUNTER — Ambulatory Visit (AMBULATORY_SURGERY_CENTER): Payer: Self-pay | Admitting: *Deleted

## 2015-07-08 VITALS — Ht 74.0 in | Wt 260.0 lb

## 2015-07-08 DIAGNOSIS — Z1211 Encounter for screening for malignant neoplasm of colon: Secondary | ICD-10-CM

## 2015-07-08 MED ORDER — NA SULFATE-K SULFATE-MG SULF 17.5-3.13-1.6 GM/177ML PO SOLN: ORAL | Status: DC

## 2015-07-08 NOTE — Progress Notes (Signed)
Patient denies any allergies to eggs or soy. Patient denies any problems with anesthesia/sedation. Patient denies any oxygen use at home and does not take any diet/weight loss medications. EMMI education assisgned to patient on colonoscopy, this was explained and instructions given to patient. 

## 2015-07-20 ENCOUNTER — Telehealth: Payer: Self-pay | Admitting: Internal Medicine

## 2015-07-20 NOTE — Telephone Encounter (Signed)
No, please ensure rescheduling and if he doesn't wish to reschedule, notify PCP

## 2015-07-22 ENCOUNTER — Encounter: Payer: 59 | Admitting: Internal Medicine

## 2015-10-29 ENCOUNTER — Other Ambulatory Visit: Payer: 59

## 2015-11-03 ENCOUNTER — Ambulatory Visit: Payer: 59 | Admitting: Family Medicine

## 2015-11-25 ENCOUNTER — Other Ambulatory Visit: Payer: Self-pay | Admitting: Family Medicine

## 2015-11-25 ENCOUNTER — Telehealth: Payer: Self-pay | Admitting: Family Medicine

## 2015-11-25 DIAGNOSIS — M25561 Pain in right knee: Secondary | ICD-10-CM

## 2015-11-25 NOTE — Telephone Encounter (Signed)
Called and spoke with the pt's wife and she stated that the pt has been having right knee pain for the last few days.  Pt had meniscus tear surgery about 4 years ago by orthopedic Dr. Theodoro Kalata in [facility].  Pt's insurance requires referral.  Please advise.//AB/CMA

## 2015-11-25 NOTE — Telephone Encounter (Signed)
Relation to [id number]  Lindamood,Jean Call back number:[phone]   Reason for call:  Requesting a referral to orthopedic for right knee pain

## 2015-11-26 NOTE — Telephone Encounter (Signed)
I placed the referral on 11/30

## 2015-11-27 NOTE — Telephone Encounter (Signed)
Called and [facility] @ 7:45am @ [phone]) informing the pt that the Ortho referral has been placed and he should hear back about the referral.  Asked the pt to give [place] a call back if he has any questions.//AB/CMA

## 2016-02-23 ENCOUNTER — Telehealth: Payer: Self-pay | Admitting: Family Medicine

## 2016-02-23 DIAGNOSIS — E785 Hyperlipidemia, unspecified: Secondary | ICD-10-CM

## 2016-02-23 DIAGNOSIS — I10 Essential (primary) hypertension: Secondary | ICD-10-CM

## 2016-02-23 DIAGNOSIS — R739 Hyperglycemia, unspecified: Secondary | ICD-10-CM

## 2016-02-23 NOTE — Telephone Encounter (Signed)
Pt says that he need labs completed. Pt would like to have orders entered into the system. Then pt would like to schedule a lab appt. ALSO, once orders are in pt would like to schedule a FU with PCP

## 2016-02-23 NOTE — Telephone Encounter (Signed)
Order lipid, hgba1c, cmp, cbc tsh for HTN, hyperglycemia and hyperlipid. Needs appt to continue meds, needs vitals checked. r

## 2016-02-23 NOTE — Telephone Encounter (Signed)
Called both cell numbers left detailed message to call back to schedule lab appt/followup with PCP. Labs are ordered.  Just need patient to schedule a lab appointment and appointment with PCP.

## 2016-02-25 NOTE — Telephone Encounter (Signed)
Called left a detailed message to call back to schedule lab appointment and appointment with PCP.

## 2016-02-26 NOTE — Telephone Encounter (Signed)
Called left detailed message on all numbers in the chart.  Informed labs requested are ordered and in the computer, he needs to now schedule a lab appointment as well as an Office visit with Dr. Charlett Blake.left detailed message to call the office.

## 2016-02-26 NOTE — Telephone Encounter (Signed)
Pt has been scheduled.  °

## 2016-03-01 ENCOUNTER — Other Ambulatory Visit (INDEPENDENT_AMBULATORY_CARE_PROVIDER_SITE_OTHER): Payer: BLUE CROSS/BLUE SHIELD

## 2016-03-01 DIAGNOSIS — I10 Essential (primary) hypertension: Secondary | ICD-10-CM | POA: Diagnosis not present

## 2016-03-01 DIAGNOSIS — R739 Hyperglycemia, unspecified: Secondary | ICD-10-CM | POA: Diagnosis not present

## 2016-03-01 DIAGNOSIS — E785 Hyperlipidemia, unspecified: Secondary | ICD-10-CM

## 2016-03-01 LAB — COMPREHENSIVE METABOLIC PANEL
ALK PHOS: 47 U/L (ref 39–117)
ALT: 30 U/L (ref 0–53)
AST: 25 U/L (ref 0–37)
Albumin: 4.4 g/dL (ref 3.5–5.2)
BUN: 15 mg/dL (ref 6–23)
CO2: 26 mEq/L (ref 19–32)
CREATININE: 0.99 mg/dL (ref 0.40–1.50)
Calcium: 9.1 mg/dL (ref 8.4–10.5)
Chloride: 102 mEq/L (ref 96–112)
GFR: 82.54 mL/min (ref >60.00)
GLUCOSE: 114 mg/dL — AB (ref 70–99)
POTASSIUM: 3.4 meq/L — AB (ref 3.5–5.1)
SODIUM: 139 meq/L (ref 135–145)
Total Bilirubin: 1.3 mg/dL — ABNORMAL HIGH (ref 0.2–1.2)
Total Protein: 7.1 g/dL (ref 6.0–8.3)

## 2016-03-01 LAB — CBC WITH DIFFERENTIAL/PLATELET
BASOS PCT: 0.5 % (ref 0.0–3.0)
Basophils Absolute: 0 10*3/uL (ref 0.0–0.1)
EOS PCT: 3.1 % (ref 0.0–5.0)
Eosinophils Absolute: 0.3 10*3/uL (ref 0.0–0.7)
HCT: 42.1 % (ref 39.0–52.0)
Hemoglobin: 14.6 g/dL (ref 13.0–17.0)
LYMPHS ABS: 2.5 10*3/uL (ref 0.7–4.0)
Lymphocytes Relative: 29.5 % (ref 12.0–46.0)
MCHC: 34.6 g/dL (ref 30.0–36.0)
MCV: 86.1 fl (ref 78.0–100.0)
Monocytes Absolute: 0.6 10*3/uL (ref 0.1–1.0)
Monocytes Relative: 7.7 % (ref 3.0–12.0)
Neutro Abs: 5 10*3/uL (ref 1.4–7.7)
Neutrophils Relative %: 59.2 % (ref 43.0–77.0)
Platelets: 223 10*3/uL (ref 150.0–400.0)
RBC: 4.89 Mil/uL (ref 4.22–5.81)
RDW: 13.2 % (ref 11.5–15.5)
WBC: 8.4 10*3/uL (ref 4.0–10.5)

## 2016-03-01 LAB — HEMOGLOBIN A1C: HEMOGLOBIN A1C: 6 % (ref 4.6–6.5)

## 2016-03-01 LAB — TSH: TSH: 1.22 u[IU]/mL (ref 0.35–4.50)

## 2016-03-01 LAB — LIPID PANEL
Cholesterol: 112 mg/dL (ref 0–200)
HDL: 36.3 mg/dL — ABNORMAL LOW (ref >39.00)
LDL Cholesterol: 49 mg/dL (ref 0–99)
NonHDL: 76.19
TRIGLYCERIDES: 135 mg/dL (ref 0.0–149.0)
Total CHOL/HDL Ratio: 3
VLDL: 27 mg/dL (ref 0.0–40.0)

## 2016-03-04 ENCOUNTER — Ambulatory Visit (INDEPENDENT_AMBULATORY_CARE_PROVIDER_SITE_OTHER): Payer: BLUE CROSS/BLUE SHIELD | Admitting: Family Medicine

## 2016-03-04 ENCOUNTER — Ambulatory Visit: Payer: Self-pay | Admitting: Family Medicine

## 2016-03-04 ENCOUNTER — Encounter: Payer: Self-pay | Admitting: Family Medicine

## 2016-03-04 VITALS — BP 150/100 | HR 77 | Temp 98.1°F | Ht 74.0 in | Wt 258.2 lb

## 2016-03-04 DIAGNOSIS — R739 Hyperglycemia, unspecified: Secondary | ICD-10-CM

## 2016-03-04 DIAGNOSIS — R5383 Other fatigue: Secondary | ICD-10-CM

## 2016-03-04 DIAGNOSIS — E663 Overweight: Secondary | ICD-10-CM

## 2016-03-04 DIAGNOSIS — R0683 Snoring: Secondary | ICD-10-CM | POA: Diagnosis not present

## 2016-03-04 DIAGNOSIS — I10 Essential (primary) hypertension: Secondary | ICD-10-CM

## 2016-03-04 DIAGNOSIS — N400 Enlarged prostate without lower urinary tract symptoms: Secondary | ICD-10-CM

## 2016-03-04 DIAGNOSIS — N62 Hypertrophy of breast: Secondary | ICD-10-CM

## 2016-03-04 DIAGNOSIS — R972 Elevated prostate specific antigen [PSA]: Secondary | ICD-10-CM

## 2016-03-04 DIAGNOSIS — R361 Hematospermia: Secondary | ICD-10-CM

## 2016-03-04 DIAGNOSIS — E669 Obesity, unspecified: Secondary | ICD-10-CM

## 2016-03-04 DIAGNOSIS — N529 Male erectile dysfunction, unspecified: Secondary | ICD-10-CM

## 2016-03-04 DIAGNOSIS — N649 Disorder of breast, unspecified: Secondary | ICD-10-CM

## 2016-03-04 DIAGNOSIS — E782 Mixed hyperlipidemia: Secondary | ICD-10-CM

## 2016-03-04 LAB — PSA: PSA: 5.2 ng/mL — ABNORMAL HIGH (ref 0.10–4.00)

## 2016-03-04 MED ORDER — AMLODIPINE BESYLATE 5 MG PO TABS: ORAL_TABLET | ORAL | Status: DC

## 2016-03-04 MED ORDER — LOSARTAN POTASSIUM 100 MG PO TABS: 100.0000 mg | ORAL_TABLET | Freq: Every day | ORAL | Status: DC

## 2016-03-04 MED ORDER — TRIAMTERENE-HCTZ 37.5-25 MG PO TABS: 1.0000 | ORAL_TABLET | Freq: Every day | ORAL | Status: DC

## 2016-03-04 NOTE — Patient Instructions (Signed)
Hypertension Hypertension, commonly called high blood pressure, is when the force of blood pumping through your arteries is too strong. Your arteries are the blood vessels that carry blood from your heart throughout your body. A blood pressure reading consists of a higher number over a lower number, such as 110/72. The higher number (systolic) is the pressure inside your arteries when your heart pumps. The lower number (diastolic) is the pressure inside your arteries when your heart relaxes. Ideally you want your blood pressure below 120/80. Hypertension forces your heart to work harder to pump blood. Your arteries may become narrow or stiff. Having untreated or uncontrolled hypertension can cause heart attack, stroke, kidney disease, and other problems. RISK FACTORS Some risk factors for high blood pressure are controllable. Others are not.  Risk factors you cannot control include:   Race. You may be at higher risk if you are African American.  Age. Risk increases with age.  Gender. Men are at higher risk than women before age 45 years. After age 65, women are at higher risk than men. Risk factors you can control include:  Not getting enough exercise or physical activity.  Being overweight.  Getting too much fat, sugar, calories, or salt in your diet.  Drinking too much alcohol. SIGNS AND SYMPTOMS Hypertension does not usually cause signs or symptoms. Extremely high blood pressure (hypertensive crisis) may cause headache, anxiety, shortness of breath, and nosebleed. DIAGNOSIS To check if you have hypertension, your health care provider will measure your blood pressure while you are seated, with your arm held at the level of your heart. It should be measured at least twice using the same arm. Certain conditions can cause a difference in blood pressure between your right and left arms. A blood pressure reading that is higher than normal on one occasion does not mean that you need treatment. If  it is not clear whether you have high blood pressure, you may be asked to return on a different day to have your blood pressure checked again. Or, you may be asked to monitor your blood pressure at home for 1 or more weeks. TREATMENT Treating high blood pressure includes making lifestyle changes and possibly taking medicine. Living a healthy lifestyle can help lower high blood pressure. You may need to change some of your habits. Lifestyle changes may include:  Following the DASH diet. This diet is high in fruits, vegetables, and whole grains. It is low in salt, red meat, and added sugars.  Keep your sodium intake below 2,300 mg per day.  Getting at least 30-45 minutes of aerobic exercise at least 4 times per week.  Losing weight if necessary.  Not smoking.  Limiting alcoholic beverages.  Learning ways to reduce stress. Your health care provider may prescribe medicine if lifestyle changes are not enough to get your blood pressure under control, and if one of the following is true:  You are 18-59 years of age and your systolic blood pressure is above 140.  You are 60 years of age or older, and your systolic blood pressure is above 150.  Your diastolic blood pressure is above 90.  You have diabetes, and your systolic blood pressure is over 140 or your diastolic blood pressure is over 90.  You have kidney disease and your blood pressure is above 140/90.  You have heart disease and your blood pressure is above 140/90. Your personal target blood pressure may vary depending on your medical conditions, your age, and other factors. HOME CARE INSTRUCTIONS    Have your blood pressure rechecked as directed by your health care provider.   Take medicines only as directed by your health care provider. Follow the directions carefully. Blood pressure medicines must be taken as prescribed. The medicine does not work as well when you skip doses. Skipping doses also puts you at risk for  problems.  Do not smoke.   Monitor your blood pressure at home as directed by your health care provider. SEEK MEDICAL CARE IF:   You think you are having a reaction to medicines taken.  You have recurrent headaches or feel dizzy.  You have swelling in your ankles.  You have trouble with your vision. SEEK IMMEDIATE MEDICAL CARE IF:  You develop a severe headache or confusion.  You have unusual weakness, numbness, or feel faint.  You have severe chest or abdominal pain.  You vomit repeatedly.  You have trouble breathing. MAKE SURE YOU:   Understand these instructions.  Will watch your condition.  Will get help right away if you are not doing well or get worse.   This information is not intended to replace advice given to you by your health care provider. Make sure you discuss any questions you have with your health care provider.   Document Released: 12/12/2005 Document Revised: 04/28/2015 Document Reviewed: 10/04/2013 Elsevier Interactive Patient Education 2016 Elsevier Inc.  

## 2016-03-04 NOTE — Progress Notes (Signed)
Subjective:    Patient ID: Joseph Fowler, male    DOB: 08/03/1956, 60 y.o.   MRN: [id number]  Chief Complaint  Patient presents with  . Follow-up    HPI Patient is in today for follow up with numerous concerns.  Patient has been having some fatigue, reports BP ranges at home are 150/80.  Patient does have some snoring using nasal strips at night, but doesn't help.  Placed a referal in home sleep study. Patient has a concern about soreness of breast bilateral. No recent illness or recent hospitalization. Denies CP/palp/HA/congestion/fevers/GI or GU c/o. Taking meds as prescribed    Past Medical History  Diagnosis Date  . Chicken pox as a child  . Hypertension 37 yrs old  . ED (erectile dysfunction) 09/12/2012  . BPH (benign prostatic hyperplasia) 09/12/2012  . Preventative health care 09/12/2012  . Overweight(278.02) 09/12/2012  . Dermatitis 09/12/2012  . Nocturia 09/12/2012  . Allergy   . Snoring 09/12/2012  . Blood in stool 12/03/2012  . Arthritis 09/06/2013  . Hyperlipidemia   . Obesity 09/12/2012    Past Surgical History  Procedure Laterality Date  . Appendectomy  60 yrs old  . Vasectomy  60 yrs old  . Torn meninscus  2011    knee- right  . Eye surgery  2005    ,right, injury, decreased visual acuity, photophobia  . Prostate biopsy    . Hernia repair      Family History  Problem Relation Age of Onset  . Diabetes Father     type 2  . Other Brother     whole in heart  . Heart disease Brother     sudden cardiac death  . Stroke Maternal Grandmother     X 2  . Arthritis Paternal Uncle   . Colon cancer Neg Hx     Social History   Social History  . Marital Status: Married    Spouse Name: N/A  . Number of Children: N/A  . Years of Education: N/A   Occupational History  . Not on file.   Social History Main Topics  . Smoking status: Former Smoker -- 2.00 packs/day for 8 years    Types: Cigarettes    Quit date: 12/26/1981  . Smokeless tobacco: Never Used  .  Alcohol Use: No  . Drug Use: No  . Sexual Activity: Yes   Other Topics Concern  . Not on file   Social History Narrative    Outpatient Prescriptions Prior to Visit  Medication Sig Dispense Refill  . atorvastatin (LIPITOR) 10 MG tablet Take 1 tablet (10 mg total) by mouth daily. 90 tablet 3  . Cyanocobalamin (VITAMIN B-12 CR PO) Take 1 tablet by mouth daily.    Nyoka Cowden Tea, Camillia sinensis, (GREEN TEA PO) Take 2 capsules by mouth daily.    Marland Kitchen loratadine (CLARITIN) 10 MG tablet Take 1 tablet (10 mg total) by mouth daily. 90 tablet 1  . losartan-hydrochlorothiazide (HYZAAR) 100-25 MG per tablet TAKE ONE TABLET BY MOUTH ONCE DAILY. 90 tablet 3  . Miconazole Nitrate 2 % LOTN Apply to lower legs daily after shower for 30 days. 56 g 1  . Multiple Vitamin (MULTIVITAMIN) tablet Take 1 tablet by mouth daily.    . sildenafil (VIAGRA) 100 MG tablet Take 1 tablet (100 mg total) by mouth as needed for erectile dysfunction. 6 tablet 1  . vitamin C (ASCORBIC ACID) 500 MG tablet Take 500 mg by mouth daily.    Marland Kitchen VITAMIN  D, ERGOCALCIFEROL, PO Take 1 capsule by mouth daily.    . Vitamins C E (CRANBERRY CONCENTRATE PO) Take 1 tablet by mouth daily.    Marland Kitchen amLODipine (NORVASC) 5 MG tablet TAKE ONE TABLET BY MOUTH ONCE DAILY PATIENT  NEEDS  TO  SCHEDULE  AN  OFFICE  VISIT 90 tablet 3  . fluticasone (FLONASE) 50 MCG/ACT nasal spray Place 2 sprays into the nose daily. 16 g 6  . Na Sulfate-K Sulfate-Mg Sulf SOLN Suprep (no substitutions)-TAKE AS DIRECTED. 354 mL 0  . sodium chloride (AYR) 0.65 % nasal spray Place 2 sprays into the nose as needed for congestion. 30 mL 12   No facility-administered medications prior to visit.    No Known Allergies  Review of Systems  Constitutional: Positive for malaise/fatigue. Negative for fever.  HENT: Negative for congestion.   Eyes: Negative for blurred vision.  Respiratory: Positive for shortness of breath.   Cardiovascular: Negative for chest pain, palpitations and  leg swelling.  Gastrointestinal: Negative for nausea, abdominal pain and blood in stool.  Genitourinary: Positive for hematuria. Negative for dysuria and frequency.  Musculoskeletal: Positive for myalgias. Negative for falls.  Skin: Negative for rash.  Neurological: Negative for dizziness, loss of consciousness and headaches.  Endo/Heme/Allergies: Negative for environmental allergies.  Psychiatric/Behavioral: Negative for depression. The patient is not nervous/anxious.        Objective:    Physical Exam  Constitutional: He is oriented to person, place, and time. He appears well-developed and well-nourished. No distress.  HENT:  Head: Normocephalic and atraumatic.  Nose: Nose normal.  Eyes: Right eye exhibits no discharge. Left eye exhibits no discharge.  Neck: Normal range of motion. Neck supple.  Cardiovascular: Normal rate and regular rhythm.   No murmur heard. Pulmonary/Chest: Effort normal and breath sounds normal.  Abdominal: Soft. Bowel sounds are normal. There is no tenderness.  Musculoskeletal: He exhibits no edema.  Neurological: He is alert and oriented to person, place, and time.  Skin: Skin is warm and dry.  Psychiatric: He has a normal mood and affect.  Nursing note and vitals reviewed.   BP 150/100 mmHg  Pulse 77  Temp(Src) 98.1 F (36.7 C) (Oral)  Ht 6\' 2"  (1.88 m)  Wt 258 lb 4 oz (117.141 kg)  BMI 33.14 kg/m2  SpO2 96% Wt Readings from Last 3 Encounters:  03/04/16 258 lb 4 oz (117.141 kg)  07/08/15 260 lb (117.935 kg)  04/28/15 259 lb (117.482 kg)     Lab Results  Component Value Date   WBC 8.4 03/01/2016   HGB 14.6 03/01/2016   HCT 42.1 03/01/2016   PLT 223.0 03/01/2016   GLUCOSE 114* 03/01/2016   CHOL 112 03/01/2016   TRIG 135.0 03/01/2016   HDL 36.30* 03/01/2016   LDLCALC 49 03/01/2016   ALT 30 03/01/2016   AST 25 03/01/2016   NA 139 03/01/2016   K 3.4* 03/01/2016   CL 102 03/01/2016   CREATININE 0.99 03/01/2016   BUN 15 03/01/2016    CO2 26 03/01/2016   TSH 1.22 03/01/2016   PSA 5.20* 03/04/2016   HGBA1C 6.0 03/01/2016    Lab Results  Component Value Date   TSH 1.22 03/01/2016   Lab Results  Component Value Date   WBC 8.4 03/01/2016   HGB 14.6 03/01/2016   HCT 42.1 03/01/2016   MCV 86.1 03/01/2016   PLT 223.0 03/01/2016   Lab Results  Component Value Date   NA 139 03/01/2016   K 3.4* 03/01/2016  CO2 26 03/01/2016   GLUCOSE 114* 03/01/2016   BUN 15 03/01/2016   CREATININE 0.99 03/01/2016   BILITOT 1.3* 03/01/2016   ALKPHOS 47 03/01/2016   AST 25 03/01/2016   ALT 30 03/01/2016   PROT 7.1 03/01/2016   ALBUMIN 4.4 03/01/2016   CALCIUM 9.1 03/01/2016   GFR 82.54 03/01/2016   Lab Results  Component Value Date   CHOL 112 03/01/2016   Lab Results  Component Value Date   HDL 36.30* 03/01/2016   Lab Results  Component Value Date   LDLCALC 49 03/01/2016   Lab Results  Component Value Date   TRIG 135.0 03/01/2016   Lab Results  Component Value Date   CHOLHDL 3 03/01/2016   Lab Results  Component Value Date   HGBA1C 6.0 03/01/2016       Assessment & Plan:   Problem List Items Addressed This Visit    BPH (benign prostatic hyperplasia)   Relevant Orders   Ambulatory referral to Urology   PSA (Completed)   ED (erectile dysfunction) - Primary   Relevant Medications   triamterene-hydrochlorothiazide (MAXZIDE-25) 37.5-25 MG tablet   losartan (COZAAR) 100 MG tablet   Other Relevant Orders   Testos,Total,Free and SHBG (Male)   Ambulatory referral to Urology   Elevated PSA    Patient previously seen by urology and with last referral chose not to go now with worsening symptoms he is agreeable to return to urology for further work up.       Gynecomastia, male    No obvious cause on review of history and med list. Previous work up unremarkable. If continues to worsen will need follow up imaging      Relevant Medications   triamterene-hydrochlorothiazide (MAXZIDE-25) 37.5-25 MG tablet     losartan (COZAAR) 100 MG tablet   Hematospermia   Relevant Orders   Ambulatory referral to Urology   Hyperglycemia    hgba1c acceptable, minimize simple carbs. Increase exercise as tolerated. Continue current meds.      Relevant Medications   triamterene-hydrochlorothiazide (MAXZIDE-25) 37.5-25 MG tablet   losartan (COZAAR) 100 MG tablet   Other Relevant Orders   Testos,Total,Free and SHBG (Male)   Hyperlipidemia, mixed    Encouraged heart healthy diet, increase exercise, avoid trans fats, consider a krill oil cap daily      Relevant Medications   amLODipine (NORVASC) 5 MG tablet   triamterene-hydrochlorothiazide (MAXZIDE-25) 37.5-25 MG tablet   losartan (COZAAR) 100 MG tablet   Hypertension    Worsening control. Poorly controlled will alter medications, encouraged DASH diet, minimize caffeine and obtain adequate sleep. Report concerning symptoms and follow up as directed and as needed. Will change to Losartan 100 mg and Maxzide 37.5/25 and proceed with home sleep study due to his worsening clinical condition      Relevant Medications   amLODipine (NORVASC) 5 MG tablet   triamterene-hydrochlorothiazide (MAXZIDE-25) 37.5-25 MG tablet   losartan (COZAAR) 100 MG tablet   Other Relevant Orders   Home sleep test   Testos,Total,Free and SHBG (Male)   Obesity    Encouraged DASH diet, decrease po intake and increase exercise as tolerated. Needs 7-8 hours of sleep nightly. Avoid trans fats, eat small, frequent meals every 4-5 hours with lean proteins, complex carbs and healthy fats. Minimize simple carbs      Relevant Medications   triamterene-hydrochlorothiazide (MAXZIDE-25) 37.5-25 MG tablet   losartan (COZAAR) 100 MG tablet   Snoring    With excessive fatigue. Will proceed with sleep study  Relevant Medications   triamterene-hydrochlorothiazide (MAXZIDE-25) 37.5-25 MG tablet   losartan (COZAAR) 100 MG tablet   Other Relevant Orders   Home sleep test    Testos,Total,Free and SHBG (Male)    Other Visit Diagnoses    Other fatigue        Relevant Medications    triamterene-hydrochlorothiazide (MAXZIDE-25) 37.5-25 MG tablet    losartan (COZAAR) 100 MG tablet    Other Relevant Orders    Home sleep test    Testos,Total,Free and SHBG (Male)       I have discontinued Mr. Sipp fluticasone, sodium chloride, and Na Sulfate-K Sulfate-Mg Sulf. I have also changed his amLODipine. Additionally, I am having him start on triamterene-hydrochlorothiazide and losartan. Lastly, I am having him maintain his multivitamin, (VITAMIN D, ERGOCALCIFEROL, PO), vitamin C, Cyanocobalamin (VITAMIN B-12 CR PO), Vitamins C E (CRANBERRY CONCENTRATE PO), (Green Tea, Camillia sinensis, (GREEN TEA PO)), Miconazole Nitrate, loratadine, atorvastatin, losartan-hydrochlorothiazide, and sildenafil.  Meds ordered this encounter  Medications  . amLODipine (NORVASC) 5 MG tablet    Sig: TAKE ONE TABLET BY MOUTH ONCE DAILY    Dispense:  90 tablet    Refill:  3  . triamterene-hydrochlorothiazide (MAXZIDE-25) 37.5-25 MG tablet    Sig: Take 1 tablet by mouth daily.    Dispense:  90 tablet    Refill:  1  . losartan (COZAAR) 100 MG tablet    Sig: Take 1 tablet (100 mg total) by mouth daily.    Dispense:  90 tablet    Refill:  1     Penni Homans, MD

## 2016-03-04 NOTE — Assessment & Plan Note (Signed)
hgba1c acceptable, minimize simple carbs. Increase exercise as tolerated. Continue current meds 

## 2016-03-04 NOTE — Progress Notes (Signed)
Pre visit review using our clinic review tool, if applicable. No additional management support is needed unless otherwise documented below in the visit note. 

## 2016-03-08 ENCOUNTER — Encounter: Payer: Self-pay | Admitting: Family Medicine

## 2016-03-08 DIAGNOSIS — R361 Hematospermia: Secondary | ICD-10-CM | POA: Insufficient documentation

## 2016-03-08 NOTE — Assessment & Plan Note (Signed)
Encouraged DASH diet, decrease po intake and increase exercise as tolerated. Needs 7-8 hours of sleep nightly. Avoid trans fats, eat small, frequent meals every 4-5 hours with lean proteins, complex carbs and healthy fats. Minimize simple carbs 

## 2016-03-08 NOTE — Assessment & Plan Note (Signed)
Patient previously seen by urology and with last referral chose not to go now with worsening symptoms he is agreeable to return to urology for further work up.

## 2016-03-08 NOTE — Assessment & Plan Note (Addendum)
Worsening control. Poorly controlled will alter medications, encouraged DASH diet, minimize caffeine and obtain adequate sleep. Report concerning symptoms and follow up as directed and as needed. Will change to Losartan 100 mg and Maxzide 37.5/25 and proceed with home sleep study due to his worsening clinical condition

## 2016-03-08 NOTE — Assessment & Plan Note (Signed)
With excessive fatigue. Will proceed with sleep study

## 2016-03-08 NOTE — Assessment & Plan Note (Signed)
No obvious cause on review of history and med list. Previous work up unremarkable. If continues to worsen will need follow up imaging

## 2016-03-08 NOTE — Assessment & Plan Note (Signed)
Encouraged heart healthy diet, increase exercise, avoid trans fats, consider a krill oil cap daily 

## 2016-03-09 ENCOUNTER — Telehealth: Payer: Self-pay | Admitting: Family Medicine

## 2016-03-09 NOTE — Telephone Encounter (Signed)
Called the patients home left a message on both cell/home number to call back.

## 2016-03-09 NOTE — Telephone Encounter (Signed)
-----   Message from Mosie Lukes, MD sent at 03/08/2016  9:46 PM EDT ----- Please check with patient and see if his wife ever repots he stops breathing. His insurance says I need this to approve his sleep study ----- Message -----    From: Synthia Innocent    Sent: 03/08/2016  12:49 PM      To: Mosie Lukes, MD  I did fax over clinicals, per Insurance he did not meet due to not having observed apneas and a BMI of greater than 35.  ----- Message -----    From: Mosie Lukes, MD    Sent: 03/08/2016  12:08 PM      To: Synthia Innocent  Can we make them clarify or maybe I did not specify well enough, he has uncontrolled HTN, excessive fatigue, snoring and obesity. In my universe that should be enough. Are they going to make me wait til he strokes out? ugh ----- Message -----    From: Synthia Innocent    Sent: 03/08/2016   9:09 AM      To: Mosie Lukes, MD  Insurance denied home sleep study, does not meet the definition of Medical Necessity. Please advise

## 2016-03-11 ENCOUNTER — Other Ambulatory Visit: Payer: Self-pay | Admitting: Family Medicine

## 2016-03-11 ENCOUNTER — Telehealth: Payer: Self-pay | Admitting: Emergency Medicine

## 2016-03-11 DIAGNOSIS — R739 Hyperglycemia, unspecified: Secondary | ICD-10-CM

## 2016-03-11 DIAGNOSIS — R5383 Other fatigue: Secondary | ICD-10-CM

## 2016-03-11 DIAGNOSIS — N649 Disorder of breast, unspecified: Secondary | ICD-10-CM

## 2016-03-11 DIAGNOSIS — R0683 Snoring: Secondary | ICD-10-CM

## 2016-03-11 DIAGNOSIS — I10 Essential (primary) hypertension: Secondary | ICD-10-CM

## 2016-03-11 DIAGNOSIS — E663 Overweight: Secondary | ICD-10-CM

## 2016-03-11 DIAGNOSIS — N529 Male erectile dysfunction, unspecified: Secondary | ICD-10-CM

## 2016-03-11 NOTE — Telephone Encounter (Signed)
LMOM for patient to [facility]  Phyllis at [place] called and said there is not enough serum to complete the Total and Free testosterone drawn on 03/04/2016. When patient calls back we wil schedule another lab appt and draw 2 red tubes....Marland KitchenKMP

## 2016-03-11 NOTE — Telephone Encounter (Signed)
Patient has  3 blood pressure medications at the pharmacy and unsure if ok to get 3. Amlodipine, losartan and triamterene HCTZ.  Is he to be on all 3???

## 2016-03-13 NOTE — Telephone Encounter (Signed)
Yes his blood pressure has been difficult to control so he needs all 3, this is part of why I need that sleep study.. Please see if his wife ever sees him stop breathing even for a minute when he is snoring

## 2016-03-14 ENCOUNTER — Other Ambulatory Visit: Payer: Self-pay | Admitting: Family Medicine

## 2016-03-14 LAB — TESTOS,TOTAL,FREE AND SHBG (FEMALE)
SEX HORMONE BINDING GLOB.: 23 nmol/L (ref 22–77)
Testosterone,Total,LC/MS/MS: 270 ng/dL (ref 250–1100)

## 2016-03-14 MED ORDER — LOSARTAN POTASSIUM 100 MG PO TABS: 100.0000 mg | ORAL_TABLET | Freq: Every day | ORAL | Status: DC

## 2016-03-14 MED ORDER — AMLODIPINE BESYLATE 5 MG PO TABS: ORAL_TABLET | ORAL | Status: DC

## 2016-03-14 MED ORDER — TRIAMTERENE-HCTZ 37.5-25 MG PO TABS
1.0000 | ORAL_TABLET | Freq: Every day | ORAL | Status: DC
Start: 2016-03-14 — End: 2017-09-13

## 2016-03-14 NOTE — Telephone Encounter (Signed)
Patient informed PCP instructions on medications. The patients Wife stated She has witnessed him not breathing.   Please review the 3 BP meds. I have pulled up to order. This patient list is a little confusing as is a 4th BP on it as well and honestly he still had a lot of quesitons and was uncertain of what he is to be on .

## 2016-03-14 NOTE — Telephone Encounter (Signed)
I see his losartan hct got split but not pulled out of his list. He now has maxzide with the Triamterene/hct and plain Losartan as well as his third med I updated MAR. I will resubmit for his sleep study.

## 2016-03-14 NOTE — Telephone Encounter (Signed)
He must meet all 4 guidelines of ... Habitual snoring, observed apneas, excessive daytime sleepiness and BMI greater than 35.  Patient does not have a BMI of greater than 35. You may write a letter for appeal and mail to [place] [id number] or fax to [phone]

## 2016-03-14 NOTE — Telephone Encounter (Signed)
His wife confirms that she sees him stop breathing, do I have to put in a new order for the sleep study knowing that? Or is this enough for the insurance folks?

## 2016-03-14 NOTE — Telephone Encounter (Signed)
Called the patient at [phone] left msg. To call back.

## 2016-03-14 NOTE — Telephone Encounter (Signed)
BP Meds have been clarified with the patient

## 2016-03-14 NOTE — Telephone Encounter (Signed)
Please write a letter to his insurance company that states  Joseph Fowler is a patient with [place] who is endorsing a history of worsening fatigue and daytime somnolence. He has been struggling with weight gain and difficulty concentrating. His wife confirms heavy snoring, restless sleep and episodes of witnessed apnea.  Recently his blood pressure has tended up and we are having trouble getting it down. It is my medical opinion that there is a strong likelihood that he has obstructive sleep apnea. He needs a sleep study to confirm his diagnosis so I can get it treated. If it is not treated he has a significantly increased risk of complications including heart disease, pulmonary disease and neurologic complications.

## 2016-03-15 ENCOUNTER — Encounter: Payer: Self-pay | Admitting: Family Medicine

## 2016-03-15 NOTE — Telephone Encounter (Signed)
Letter completed, faxed and mailed.,

## 2016-04-15 ENCOUNTER — Ambulatory Visit (INDEPENDENT_AMBULATORY_CARE_PROVIDER_SITE_OTHER): Payer: BLUE CROSS/BLUE SHIELD | Admitting: Family Medicine

## 2016-04-15 VITALS — BP 160/96 | HR 76

## 2016-04-15 DIAGNOSIS — I10 Essential (primary) hypertension: Secondary | ICD-10-CM

## 2016-04-15 MED ORDER — METOPROLOL SUCCINATE ER 25 MG PO TB24: 25.0000 mg | ORAL_TABLET | Freq: Every day | ORAL | Status: DC

## 2016-04-15 NOTE — Progress Notes (Signed)
RN blood check note reviewed. Agree with documention and plan. 

## 2016-04-15 NOTE — Progress Notes (Signed)
Pre visit review using our clinic review tool, if applicable. No additional management support is needed unless otherwise documented below in the visit note.  Per 02/23/09/17 AVS: Return in about 6 months (around 09/04/2016) for 6 week nurse visit.  Pt reports compliance w/ BP medications and took them prior to visit today.   Per Dr. Charlett Blake: Add metroprolol succinate 25 mg daily. Return for nurse visit BP check in 1 month.  Medication filled to pharmacy as requested. Pt verbalized understanding of instructions.   Next appt: 05/13/16  Dorrene German, RN   RN blood check note reviewed. Agree with documention and plan.

## 2016-04-22 ENCOUNTER — Telehealth: Payer: Self-pay | Admitting: Family Medicine

## 2016-04-22 NOTE — Telephone Encounter (Signed)
Caller name: Self  Can be reached: [phone]   Reason for call: Patient states that his insurance denied his sleep study that was ordered. Please advise him on what to do at this apoint

## 2016-04-22 NOTE — Telephone Encounter (Signed)
His insurance turned down his sleep study and I really feel he needs it is there an appeal process I can pursue? Please advise

## 2016-04-25 ENCOUNTER — Telehealth: Payer: Self-pay | Admitting: Family Medicine

## 2016-04-25 NOTE — Telephone Encounter (Signed)
Called left message to call back 

## 2016-04-25 NOTE — Telephone Encounter (Signed)
Patient returning your call.

## 2016-04-25 NOTE — Telephone Encounter (Signed)
Relationship to patient: self Can be reached: [phone] Pharmacy: [facility] [place], [place].  Reason for call: Pt took metoprolol yesterday morning & this morning about 8:00-8:30am. He felt a little off yesterday end of day but today he had to come home from work. He said that he is lightheaded/dizzy. He feels very loopy and tired and legs feel like rubber. He said he feels "drugged". This isn't going to work for him. Please f/u with pt.  Approx 1:00pm pt BP: 146/90 1st reading 150/86 2nd reading 152/91 3rd reading

## 2016-04-25 NOTE — Telephone Encounter (Signed)
Are these numbers on or not on the Metoprolol? Switch to Carvedilol 6.25 mg po bid #60, with 1 refill bp check in next month or two

## 2016-04-26 MED ORDER — CARVEDILOL 6.25 MG PO TABS: 6.2500 mg | ORAL_TABLET | Freq: Two times a day (BID) | ORAL | Status: DC

## 2016-04-26 NOTE — Telephone Encounter (Signed)
Spoke to the patient and he was on metoprolol when taking his BP.  He did not take it today and has felt better.Informed the patient to stop metoprolol. D/C metoprolol on his list, did add carvedilol and sent to the [facility] on [address].  The patient did agree to try.

## 2016-04-26 NOTE — Telephone Encounter (Signed)
Called left message to call back 

## 2016-04-26 NOTE — Addendum Note (Signed)
Addended by: Sharon Seller B on: 04/26/2016 06:14 PM   Modules accepted: Orders, Medications

## 2016-04-26 NOTE — Telephone Encounter (Signed)
Called left msg. To call back 

## 2016-05-11 ENCOUNTER — Telehealth: Payer: Self-pay | Admitting: Family Medicine

## 2016-05-11 ENCOUNTER — Other Ambulatory Visit: Payer: Self-pay | Admitting: Family Medicine

## 2016-05-11 NOTE — Telephone Encounter (Signed)
Caller name: [facility]  Can be reached: [phone]  Reason for call: [facility] gave date for patients appointment 06/24/16 @ 8:00 with Dr. Alyson Ingles.

## 2016-05-11 NOTE — Telephone Encounter (Signed)
Noted  

## 2016-05-25 ENCOUNTER — Other Ambulatory Visit: Payer: Self-pay | Admitting: Family Medicine

## 2016-07-12 NOTE — Telephone Encounter (Signed)
Complete

## 2016-09-01 ENCOUNTER — Ambulatory Visit: Payer: BLUE CROSS/BLUE SHIELD | Admitting: Family Medicine

## 2016-09-12 ENCOUNTER — Other Ambulatory Visit: Payer: Self-pay | Admitting: Family Medicine

## 2016-09-12 DIAGNOSIS — R0683 Snoring: Secondary | ICD-10-CM

## 2016-09-12 DIAGNOSIS — N529 Male erectile dysfunction, unspecified: Secondary | ICD-10-CM

## 2016-09-12 DIAGNOSIS — I10 Essential (primary) hypertension: Secondary | ICD-10-CM

## 2016-09-12 DIAGNOSIS — E663 Overweight: Secondary | ICD-10-CM

## 2016-09-12 DIAGNOSIS — R739 Hyperglycemia, unspecified: Secondary | ICD-10-CM

## 2016-09-12 DIAGNOSIS — R5383 Other fatigue: Secondary | ICD-10-CM

## 2016-09-12 DIAGNOSIS — N649 Disorder of breast, unspecified: Secondary | ICD-10-CM

## 2016-09-13 ENCOUNTER — Other Ambulatory Visit: Payer: Self-pay | Admitting: Family Medicine

## 2016-09-13 DIAGNOSIS — N649 Disorder of breast, unspecified: Secondary | ICD-10-CM

## 2016-09-13 DIAGNOSIS — R739 Hyperglycemia, unspecified: Secondary | ICD-10-CM

## 2016-09-13 DIAGNOSIS — R5383 Other fatigue: Secondary | ICD-10-CM

## 2016-09-13 DIAGNOSIS — R0683 Snoring: Secondary | ICD-10-CM

## 2016-09-13 DIAGNOSIS — E663 Overweight: Secondary | ICD-10-CM

## 2016-09-13 DIAGNOSIS — I10 Essential (primary) hypertension: Secondary | ICD-10-CM

## 2016-09-13 DIAGNOSIS — N529 Male erectile dysfunction, unspecified: Secondary | ICD-10-CM

## 2016-09-24 DIAGNOSIS — Z23 Encounter for immunization: Secondary | ICD-10-CM | POA: Diagnosis not present

## 2016-09-24 DIAGNOSIS — Z719 Counseling, unspecified: Secondary | ICD-10-CM | POA: Diagnosis not present

## 2016-09-24 DIAGNOSIS — T148 Other injury of unspecified body region: Secondary | ICD-10-CM | POA: Diagnosis not present

## 2016-11-03 ENCOUNTER — Other Ambulatory Visit: Payer: Self-pay | Admitting: Family Medicine

## 2016-11-28 ENCOUNTER — Other Ambulatory Visit: Payer: Self-pay | Admitting: Family Medicine

## 2017-02-27 ENCOUNTER — Other Ambulatory Visit: Payer: Self-pay | Admitting: Family Medicine

## 2017-03-05 ENCOUNTER — Encounter: Payer: Self-pay | Admitting: Family Medicine

## 2017-03-05 LAB — COLOGUARD

## 2017-03-07 DIAGNOSIS — M25562 Pain in left knee: Secondary | ICD-10-CM | POA: Diagnosis not present

## 2017-03-11 ENCOUNTER — Other Ambulatory Visit: Payer: Self-pay | Admitting: Family Medicine

## 2017-03-11 DIAGNOSIS — E663 Overweight: Secondary | ICD-10-CM

## 2017-03-11 DIAGNOSIS — N529 Male erectile dysfunction, unspecified: Secondary | ICD-10-CM

## 2017-03-11 DIAGNOSIS — N649 Disorder of breast, unspecified: Secondary | ICD-10-CM

## 2017-03-11 DIAGNOSIS — R0683 Snoring: Secondary | ICD-10-CM

## 2017-03-11 DIAGNOSIS — R5383 Other fatigue: Secondary | ICD-10-CM

## 2017-03-11 DIAGNOSIS — I10 Essential (primary) hypertension: Secondary | ICD-10-CM

## 2017-03-11 DIAGNOSIS — R739 Hyperglycemia, unspecified: Secondary | ICD-10-CM

## 2017-03-13 ENCOUNTER — Other Ambulatory Visit: Payer: Self-pay | Admitting: Family Medicine

## 2017-03-13 DIAGNOSIS — R739 Hyperglycemia, unspecified: Secondary | ICD-10-CM

## 2017-03-13 DIAGNOSIS — R0683 Snoring: Secondary | ICD-10-CM

## 2017-03-13 DIAGNOSIS — R5383 Other fatigue: Secondary | ICD-10-CM

## 2017-03-13 DIAGNOSIS — N529 Male erectile dysfunction, unspecified: Secondary | ICD-10-CM

## 2017-03-13 DIAGNOSIS — E663 Overweight: Secondary | ICD-10-CM

## 2017-03-13 DIAGNOSIS — N649 Disorder of breast, unspecified: Secondary | ICD-10-CM

## 2017-03-13 DIAGNOSIS — I10 Essential (primary) hypertension: Secondary | ICD-10-CM

## 2017-03-29 ENCOUNTER — Other Ambulatory Visit: Payer: Self-pay | Admitting: Family Medicine

## 2017-04-24 ENCOUNTER — Other Ambulatory Visit: Payer: Self-pay | Admitting: Family Medicine

## 2017-05-01 ENCOUNTER — Ambulatory Visit: Payer: BLUE CROSS/BLUE SHIELD | Admitting: Medical

## 2017-05-01 ENCOUNTER — Encounter: Payer: Self-pay | Admitting: Family Medicine

## 2017-05-01 NOTE — Telephone Encounter (Signed)
error:[id number] ° °

## 2017-05-19 ENCOUNTER — Other Ambulatory Visit: Payer: Self-pay | Admitting: Family Medicine

## 2017-05-30 ENCOUNTER — Other Ambulatory Visit: Payer: Self-pay | Admitting: Family Medicine

## 2017-06-05 ENCOUNTER — Other Ambulatory Visit: Payer: Self-pay | Admitting: Family Medicine

## 2017-06-05 MED ORDER — ATORVASTATIN CALCIUM 10 MG PO TABS: 10.0000 mg | ORAL_TABLET | Freq: Every day | ORAL | 0 refills | Status: DC

## 2017-08-16 ENCOUNTER — Other Ambulatory Visit: Payer: Self-pay | Admitting: Family Medicine

## 2017-08-17 NOTE — Telephone Encounter (Signed)
Patient was instructed to F/U in 1 month/must sched OV first/thx dmf

## 2017-08-21 ENCOUNTER — Telehealth: Payer: Self-pay

## 2017-08-21 MED ORDER — AMLODIPINE BESYLATE 5 MG PO TABS: 5.0000 mg | ORAL_TABLET | Freq: Every day | ORAL | 0 refills | Status: DC

## 2017-08-21 NOTE — Telephone Encounter (Signed)
So no excuse, there isn't one. Waunita Schooner didn't follow up from last request.  When can he get a 11:00 AM or after 6:00 PM for a check in.   Jacqulyn Ducking [birth year] is in need of a refill   For Amlodipine 5mg   Thank you

## 2017-08-21 NOTE — Telephone Encounter (Signed)
30 day Rx sent to [facility] pharmacy.

## 2017-09-01 ENCOUNTER — Other Ambulatory Visit: Payer: Self-pay | Admitting: Family Medicine

## 2017-09-03 ENCOUNTER — Other Ambulatory Visit: Payer: Self-pay | Admitting: Family Medicine

## 2017-09-04 MED ORDER — ATORVASTATIN CALCIUM 10 MG PO TABS: 10.0000 mg | ORAL_TABLET | Freq: Every day | ORAL | 1 refills | Status: DC

## 2017-09-07 ENCOUNTER — Other Ambulatory Visit: Payer: Self-pay | Admitting: Family Medicine

## 2017-09-07 DIAGNOSIS — I10 Essential (primary) hypertension: Secondary | ICD-10-CM

## 2017-09-07 DIAGNOSIS — R0683 Snoring: Secondary | ICD-10-CM

## 2017-09-07 DIAGNOSIS — E663 Overweight: Secondary | ICD-10-CM

## 2017-09-07 DIAGNOSIS — R5383 Other fatigue: Secondary | ICD-10-CM

## 2017-09-07 DIAGNOSIS — R739 Hyperglycemia, unspecified: Secondary | ICD-10-CM

## 2017-09-07 DIAGNOSIS — N529 Male erectile dysfunction, unspecified: Secondary | ICD-10-CM

## 2017-09-07 DIAGNOSIS — N649 Disorder of breast, unspecified: Secondary | ICD-10-CM

## 2017-09-08 ENCOUNTER — Other Ambulatory Visit: Payer: Self-pay | Admitting: Family Medicine

## 2017-09-08 DIAGNOSIS — N649 Disorder of breast, unspecified: Secondary | ICD-10-CM

## 2017-09-08 DIAGNOSIS — N529 Male erectile dysfunction, unspecified: Secondary | ICD-10-CM

## 2017-09-08 DIAGNOSIS — R0683 Snoring: Secondary | ICD-10-CM

## 2017-09-08 DIAGNOSIS — E663 Overweight: Secondary | ICD-10-CM

## 2017-09-08 DIAGNOSIS — R739 Hyperglycemia, unspecified: Secondary | ICD-10-CM

## 2017-09-08 DIAGNOSIS — R5383 Other fatigue: Secondary | ICD-10-CM

## 2017-09-08 DIAGNOSIS — I10 Essential (primary) hypertension: Secondary | ICD-10-CM

## 2017-09-11 ENCOUNTER — Other Ambulatory Visit: Payer: Self-pay | Admitting: Family Medicine

## 2017-09-11 DIAGNOSIS — R739 Hyperglycemia, unspecified: Secondary | ICD-10-CM

## 2017-09-11 DIAGNOSIS — N649 Disorder of breast, unspecified: Secondary | ICD-10-CM

## 2017-09-11 DIAGNOSIS — I10 Essential (primary) hypertension: Secondary | ICD-10-CM

## 2017-09-11 DIAGNOSIS — N529 Male erectile dysfunction, unspecified: Secondary | ICD-10-CM

## 2017-09-11 DIAGNOSIS — R5383 Other fatigue: Secondary | ICD-10-CM

## 2017-09-11 DIAGNOSIS — R0683 Snoring: Secondary | ICD-10-CM

## 2017-09-11 DIAGNOSIS — E663 Overweight: Secondary | ICD-10-CM

## 2017-09-13 ENCOUNTER — Encounter: Payer: Self-pay | Admitting: Family Medicine

## 2017-09-13 ENCOUNTER — Other Ambulatory Visit: Payer: Self-pay | Admitting: Family Medicine

## 2017-09-13 DIAGNOSIS — R0683 Snoring: Secondary | ICD-10-CM

## 2017-09-13 DIAGNOSIS — R739 Hyperglycemia, unspecified: Secondary | ICD-10-CM

## 2017-09-13 DIAGNOSIS — I10 Essential (primary) hypertension: Secondary | ICD-10-CM

## 2017-09-13 DIAGNOSIS — R5383 Other fatigue: Secondary | ICD-10-CM

## 2017-09-13 DIAGNOSIS — E663 Overweight: Secondary | ICD-10-CM

## 2017-09-13 DIAGNOSIS — N649 Disorder of breast, unspecified: Secondary | ICD-10-CM

## 2017-09-13 DIAGNOSIS — N529 Male erectile dysfunction, unspecified: Secondary | ICD-10-CM

## 2017-09-13 MED ORDER — TRIAMTERENE-HCTZ 37.5-25 MG PO TABS: 1.0000 | ORAL_TABLET | Freq: Every day | ORAL | 0 refills | Status: DC

## 2017-09-13 MED ORDER — AMLODIPINE BESYLATE 5 MG PO TABS: 5.0000 mg | ORAL_TABLET | Freq: Every day | ORAL | 0 refills | Status: DC

## 2017-10-03 ENCOUNTER — Ambulatory Visit (INDEPENDENT_AMBULATORY_CARE_PROVIDER_SITE_OTHER): Payer: BLUE CROSS/BLUE SHIELD | Admitting: Family Medicine

## 2017-10-03 VITALS — BP 142/90 | HR 66 | Temp 98.1°F | Resp 18 | Wt 247.8 lb

## 2017-10-03 DIAGNOSIS — R972 Elevated prostate specific antigen [PSA]: Secondary | ICD-10-CM | POA: Diagnosis not present

## 2017-10-03 DIAGNOSIS — R739 Hyperglycemia, unspecified: Secondary | ICD-10-CM

## 2017-10-03 DIAGNOSIS — E782 Mixed hyperlipidemia: Secondary | ICD-10-CM

## 2017-10-03 DIAGNOSIS — R7989 Other specified abnormal findings of blood chemistry: Secondary | ICD-10-CM

## 2017-10-03 DIAGNOSIS — Z23 Encounter for immunization: Secondary | ICD-10-CM | POA: Diagnosis not present

## 2017-10-03 DIAGNOSIS — Z0000 Encounter for general adult medical examination without abnormal findings: Secondary | ICD-10-CM

## 2017-10-03 DIAGNOSIS — N62 Hypertrophy of breast: Secondary | ICD-10-CM | POA: Diagnosis not present

## 2017-10-03 DIAGNOSIS — Z7289 Other problems related to lifestyle: Secondary | ICD-10-CM

## 2017-10-03 DIAGNOSIS — I10 Essential (primary) hypertension: Secondary | ICD-10-CM | POA: Diagnosis not present

## 2017-10-03 DIAGNOSIS — E669 Obesity, unspecified: Secondary | ICD-10-CM | POA: Diagnosis not present

## 2017-10-03 NOTE — Assessment & Plan Note (Addendum)
Adequately controlled, no changes to meds. Encouraged heart healthy diet such as the DASH diet and exercise as tolerated. Drank a caffeine drink on the way to visit. He is ocming back next week for fasting labs we will arrange a nurse BP visit and he will avoid caffeine prior to visit.

## 2017-10-03 NOTE — Progress Notes (Signed)
Subjective:  I acted as a Education administrator for Dr. Charlett Blake. [place], [place]  Patient ID: Joseph Fowler, male    DOB: Sep 11, 1956, 61 y.o.   MRN: [id number]  No chief complaint on file.   HPI  Patient is in today for a follow up and he reports he is doing well. No recent febrile illness or hospitalizations. He has been exercising more and going to gym. He has been able to loose weight and he reports feeling better. Less fatigue, myalgias. Denies CP/palp/SOB/HA/congestion/fevers/GI or GU c/o. Taking meds as prescribed  Patient Care Team: Mosie Lukes, MD as PCP - General (Family Medicine)   Past Medical History:  Diagnosis Date  . Allergy   . Arthritis 09/06/2013  . Blood in stool 12/03/2012  . BPH (benign prostatic hyperplasia) 09/12/2012  . Chicken pox as a child  . Dermatitis 09/12/2012  . ED (erectile dysfunction) 09/12/2012  . Hyperlipidemia   . Hypertension 61 yrs old  . Nocturia 09/12/2012  . Obesity 09/12/2012  . Overweight(278.02) 09/12/2012  . Preventative health care 09/12/2012  . Snoring 09/12/2012    Past Surgical History:  Procedure Laterality Date  . APPENDECTOMY  61 yrs old  . EYE SURGERY  2005   ,right, injury, decreased visual acuity, photophobia  . HERNIA REPAIR    . PROSTATE BIOPSY    . torn meninscus  2011   knee- right  . VASECTOMY  61 yrs old    Family History  Problem Relation Age of Onset  . Diabetes Father        type 2  . Other Brother        whole in heart  . Heart disease Brother        sudden cardiac death  . Stroke Maternal Grandmother        X 2  . Arthritis Paternal Uncle   . Colon cancer Neg Hx     Social History   Social History  . Marital status: Married    Spouse name: N/A  . Number of children: N/A  . Years of education: N/A   Occupational History  . Not on file.   Social History Main Topics  . Smoking status: Former Smoker    Packs/day: 2.00    Years: 8.00    Types: Cigarettes    Quit date: 12/26/1981  . Smokeless tobacco:  Never Used  . Alcohol use No  . Drug use: No  . Sexual activity: Yes   Other Topics Concern  . Not on file   Social History Narrative  . No narrative on file    Outpatient Medications Prior to Visit  Medication Sig Dispense Refill  . amLODipine (NORVASC) 5 MG tablet Take 1 tablet (5 mg total) by mouth daily. 30 tablet 0  . atorvastatin (LIPITOR) 10 MG tablet Take 1 tablet (10 mg total) by mouth daily. 30 tablet 1  . Cyanocobalamin (VITAMIN B-12 CR PO) Take 1 tablet by mouth daily.    Nyoka Cowden Tea, Camillia sinensis, (GREEN TEA PO) Take 2 capsules by mouth daily.    Marland Kitchen loratadine (CLARITIN) 10 MG tablet Take 1 tablet (10 mg total) by mouth daily. 90 tablet 1  . losartan (COZAAR) 100 MG tablet TAKE 1 TABLET BY MOUTH ONCE DAILY 90 tablet 0  . Miconazole Nitrate 2 % LOTN Apply to lower legs daily after shower for 30 days. 56 g 1  . Multiple Vitamin (MULTIVITAMIN) tablet Take 1 tablet by mouth daily.    Marland Kitchen triamterene-hydrochlorothiazide (  MAXZIDE-25) 37.5-25 MG tablet Take 1 tablet by mouth daily. 30 tablet 0  . VIAGRA 100 MG tablet TAKE ONE TABLET BY MOUTH AS NEEDED FOR  ERECTILE  DYSFUNCTION 3 tablet 3  . vitamin C (ASCORBIC ACID) 500 MG tablet Take 500 mg by mouth daily.    Marland Kitchen VITAMIN D, ERGOCALCIFEROL, PO Take 1 capsule by mouth daily.    . Vitamins C E (CRANBERRY CONCENTRATE PO) Take 1 tablet by mouth daily.    . carvedilol (COREG) 6.25 MG tablet Take 1 tablet (6.25 mg total) by mouth 2 (two) times daily. 60 tablet 1   No facility-administered medications prior to visit.     No Known Allergies  Review of Systems  Constitutional: Negative for fever and malaise/fatigue.  HENT: Negative for congestion.   Eyes: Negative for blurred vision.  Respiratory: Negative for shortness of breath.   Cardiovascular: Negative for chest pain, palpitations and leg swelling.  Gastrointestinal: Negative for abdominal pain, blood in stool and nausea.  Genitourinary: Negative for dysuria and frequency.   Musculoskeletal: Negative for falls.  Skin: Negative for rash.  Neurological: Negative for dizziness, loss of consciousness and headaches.  Endo/Heme/Allergies: Negative for environmental allergies.  Psychiatric/Behavioral: Negative for depression. The patient is not nervous/anxious.        Objective:    Physical Exam  Constitutional: He is oriented to person, place, and time. He appears well-developed and well-nourished. No distress.  HENT:  Head: Normocephalic and atraumatic.  Nose: Nose normal.  Eyes: Right eye exhibits no discharge. Left eye exhibits no discharge.  Neck: Normal range of motion. Neck supple.  Cardiovascular: Normal rate and regular rhythm.   No murmur heard. Pulmonary/Chest: Effort normal and breath sounds normal.  Abdominal: Soft. Bowel sounds are normal. There is no tenderness.  Musculoskeletal: He exhibits no edema.  Neurological: He is alert and oriented to person, place, and time.  Skin: Skin is warm and dry.  Psychiatric: He has a normal mood and affect.  Nursing note and vitals reviewed.   BP (!) 142/90 (BP Location: Left Arm, Patient Position: Sitting, Cuff Size: Normal)   Pulse 66   Temp 98.1 F (36.7 C) (Oral)   Resp 18   Wt 247 lb 12.8 oz (112.4 kg)   SpO2 98%   BMI 31.82 kg/m  Wt Readings from Last 3 Encounters:  10/03/17 247 lb 12.8 oz (112.4 kg)  03/04/16 258 lb 4 oz (117.1 kg)  07/08/15 260 lb (117.9 kg)   BP Readings from Last 3 Encounters:  10/03/17 (!) 142/90  04/15/16 (!) 160/96  03/04/16 (!) 150/100     Immunization History  Administered Date(s) Administered  . Influenza Split 09/12/2012  . Influenza,inj,Quad PF,6+ Mos 09/06/2013  . Pneumococcal Conjugate-13 12/20/2013  . Td 08/13/2002  . Tdap 02/11/2009    Health Maintenance  Topic Date Due  . Hepatitis C Screening  Jul 03, 1956  . HIV Screening  03/27/1973  . COLONOSCOPY  03/27/2008  . INFLUENZA VACCINE  07/26/2017  . TETANUS/TDAP  02/11/2019    Lab Results    Component Value Date   WBC 8.4 03/01/2016   HGB 14.6 03/01/2016   HCT 42.1 03/01/2016   PLT 223.0 03/01/2016   GLUCOSE 114 (H) 03/01/2016   CHOL 112 03/01/2016   TRIG 135.0 03/01/2016   HDL 36.30 (L) 03/01/2016   LDLCALC 49 03/01/2016   ALT 30 03/01/2016   AST 25 03/01/2016   NA 139 03/01/2016   K 3.4 (L) 03/01/2016   CL 102 03/01/2016  CREATININE 0.99 03/01/2016   BUN 15 03/01/2016   CO2 26 03/01/2016   TSH 1.22 03/01/2016   PSA 5.20 (H) 03/04/2016   HGBA1C 6.0 03/01/2016    Lab Results  Component Value Date   TSH 1.22 03/01/2016   Lab Results  Component Value Date   WBC 8.4 03/01/2016   HGB 14.6 03/01/2016   HCT 42.1 03/01/2016   MCV 86.1 03/01/2016   PLT 223.0 03/01/2016   Lab Results  Component Value Date   NA 139 03/01/2016   K 3.4 (L) 03/01/2016   CO2 26 03/01/2016   GLUCOSE 114 (H) 03/01/2016   BUN 15 03/01/2016   CREATININE 0.99 03/01/2016   BILITOT 1.3 (H) 03/01/2016   ALKPHOS 47 03/01/2016   AST 25 03/01/2016   ALT 30 03/01/2016   PROT 7.1 03/01/2016   ALBUMIN 4.4 03/01/2016   CALCIUM 9.1 03/01/2016   GFR 82.54 03/01/2016   Lab Results  Component Value Date   CHOL 112 03/01/2016   Lab Results  Component Value Date   HDL 36.30 (L) 03/01/2016   Lab Results  Component Value Date   LDLCALC 49 03/01/2016   Lab Results  Component Value Date   TRIG 135.0 03/01/2016   Lab Results  Component Value Date   CHOLHDL 3 03/01/2016   Lab Results  Component Value Date   HGBA1C 6.0 03/01/2016         Assessment & Plan:   Problem List Items Addressed This Visit    Hypertension    Adequately controlled, no changes to meds. Encouraged heart healthy diet such as the DASH diet and exercise as tolerated. Drank a caffeine drink on the way to visit. He is ocming back next week for fasting labs we will arrange a nurse BP visit and he will avoid caffeine prior to visit.       Relevant Orders   CBC   Comprehensive metabolic panel   TSH    Preventative health care   Relevant Orders   Hepatitis C antibody   Obesity    Good weight loss with increased exercise and improved diet. Encouraged DASH diet, decrease po intake and increase exercise as tolerated. Needs 7-8 hours of sleep nightly. Avoid trans fats, eat small, frequent meals every 4-5 hours with lean proteins, complex carbs and healthy fats. Minimize simple carbs      Hyperglycemia    hgba1c acceptable, minimize simple carbs. Increase exercise as tolerated. Continue current meds      Relevant Orders   Hemoglobin A1c   Elevated PSA    Repeat PSA and testosterone today. He had biopsies with urology which were negative but he never follow up with them.       Relevant Orders   PSA   Hyperlipidemia, mixed    Tolerating statin, encouraged heart healthy diet, avoid trans fats, minimize simple carbs and saturated fats. Increase exercise as tolerated      Relevant Orders   Lipid panel   Gynecomastia, male    Previous MM unremarkable he will report worsening symptoms       Other Visit Diagnoses    Needs flu shot    -  Primary   Relevant Orders   Flu Vaccine QUAD 6+ mos PF IM (Fluarix Quad PF)   Other problems related to lifestyle       Relevant Orders   Hepatitis C antibody   Low testosterone       Relevant Orders   Testosterone  I have discontinued Mr. Raz carvedilol. I am also having him maintain his multivitamin, (VITAMIN D, ERGOCALCIFEROL, PO), vitamin C, Cyanocobalamin (VITAMIN B-12 CR PO), Vitamins C E (CRANBERRY CONCENTRATE PO), (Green Tea, Camillia sinensis, (GREEN TEA PO)), Miconazole Nitrate, loratadine, VIAGRA, atorvastatin, losartan, amLODipine, and triamterene-hydrochlorothiazide.  No orders of the defined types were placed in this encounter.   CMA served as Education administrator during this visit. History, Physical and Plan performed by medical provider. Documentation and orders reviewed and attested to.  Penni Homans, MD

## 2017-10-03 NOTE — Patient Instructions (Signed)

## 2017-10-03 NOTE — Assessment & Plan Note (Signed)
Tolerating statin, encouraged heart healthy diet, avoid trans fats, minimize simple carbs and saturated fats. Increase exercise as tolerated 

## 2017-10-03 NOTE — Assessment & Plan Note (Signed)
hgba1c acceptable, minimize simple carbs. Increase exercise as tolerated. Continue current meds 

## 2017-10-03 NOTE — Assessment & Plan Note (Addendum)
Repeat PSA and testosterone today. He had biopsies with urology which were negative but he never follow up with them.

## 2017-10-04 NOTE — Assessment & Plan Note (Signed)
Previous MM unremarkable he will report worsening symptoms

## 2017-10-04 NOTE — Assessment & Plan Note (Signed)
Good weight loss with increased exercise and improved diet. Encouraged DASH diet, decrease po intake and increase exercise as tolerated. Needs 7-8 hours of sleep nightly. Avoid trans fats, eat small, frequent meals every 4-5 hours with lean proteins, complex carbs and healthy fats. Minimize simple carbs

## 2017-10-05 DIAGNOSIS — E782 Mixed hyperlipidemia: Secondary | ICD-10-CM | POA: Diagnosis not present

## 2017-10-05 DIAGNOSIS — Z23 Encounter for immunization: Secondary | ICD-10-CM | POA: Diagnosis not present

## 2017-10-12 ENCOUNTER — Other Ambulatory Visit (INDEPENDENT_AMBULATORY_CARE_PROVIDER_SITE_OTHER): Payer: BLUE CROSS/BLUE SHIELD

## 2017-10-12 ENCOUNTER — Ambulatory Visit (INDEPENDENT_AMBULATORY_CARE_PROVIDER_SITE_OTHER): Payer: BLUE CROSS/BLUE SHIELD | Admitting: Family Medicine

## 2017-10-12 VITALS — BP 134/90 | HR 70

## 2017-10-12 DIAGNOSIS — R7989 Other specified abnormal findings of blood chemistry: Secondary | ICD-10-CM

## 2017-10-12 DIAGNOSIS — I10 Essential (primary) hypertension: Secondary | ICD-10-CM | POA: Diagnosis not present

## 2017-10-12 DIAGNOSIS — R739 Hyperglycemia, unspecified: Secondary | ICD-10-CM | POA: Diagnosis not present

## 2017-10-12 DIAGNOSIS — R972 Elevated prostate specific antigen [PSA]: Secondary | ICD-10-CM | POA: Diagnosis not present

## 2017-10-12 DIAGNOSIS — Z0000 Encounter for general adult medical examination without abnormal findings: Secondary | ICD-10-CM

## 2017-10-12 DIAGNOSIS — E782 Mixed hyperlipidemia: Secondary | ICD-10-CM | POA: Diagnosis not present

## 2017-10-12 DIAGNOSIS — Z7289 Other problems related to lifestyle: Secondary | ICD-10-CM | POA: Diagnosis not present

## 2017-10-12 LAB — LIPID PANEL
CHOL/HDL RATIO: 3
Cholesterol: 116 mg/dL (ref 0–200)
HDL: 40 mg/dL (ref >39.00)
LDL CALC: 47 mg/dL (ref 0–99)
NONHDL: 75.55
Triglycerides: 141 mg/dL (ref 0.0–149.0)
VLDL: 28.2 mg/dL (ref 0.0–40.0)

## 2017-10-12 LAB — COMPREHENSIVE METABOLIC PANEL
ALBUMIN: 4.5 g/dL (ref 3.5–5.2)
ALT: 23 U/L (ref 0–53)
AST: 21 U/L (ref 0–37)
Alkaline Phosphatase: 45 U/L (ref 39–117)
BUN: 18 mg/dL (ref 6–23)
CHLORIDE: 101 meq/L (ref 96–112)
CO2: 29 mEq/L (ref 19–32)
CREATININE: 1.09 mg/dL (ref 0.40–1.50)
Calcium: 9.7 mg/dL (ref 8.4–10.5)
GFR: 73.46 mL/min (ref >60.00)
GLUCOSE: 95 mg/dL (ref 70–99)
POTASSIUM: 3.8 meq/L (ref 3.5–5.1)
SODIUM: 137 meq/L (ref 135–145)
Total Bilirubin: 1.2 mg/dL (ref 0.2–1.2)
Total Protein: 7.2 g/dL (ref 6.0–8.3)

## 2017-10-12 LAB — CBC
HCT: 43.4 % (ref 39.0–52.0)
Hemoglobin: 14.7 g/dL (ref 13.0–17.0)
MCHC: 33.9 g/dL (ref 30.0–36.0)
MCV: 90.3 fl (ref 78.0–100.0)
Platelets: 219 10*3/uL (ref 150.0–400.0)
RBC: 4.81 Mil/uL (ref 4.22–5.81)
RDW: 13.1 % (ref 11.5–15.5)
WBC: 7.1 10*3/uL (ref 4.0–10.5)

## 2017-10-12 LAB — TESTOSTERONE: TESTOSTERONE: 229.03 ng/dL — AB (ref 300.00–890.00)

## 2017-10-12 LAB — HEMOGLOBIN A1C: Hgb A1c MFr Bld: 5.7 % (ref 4.6–6.5)

## 2017-10-12 LAB — TSH: TSH: 1.58 u[IU]/mL (ref 0.35–4.50)

## 2017-10-12 LAB — PSA: PSA: 7.29 ng/mL — ABNORMAL HIGH (ref 0.10–4.00)

## 2017-10-12 NOTE — Progress Notes (Signed)
Pre visit review using our clinic tool,if applicable. No additional management support is needed unless otherwise documented below in the visit note.   Patient in for BP check per order from Dr. Frederik Pear dated 10/03/17.  Patient voices no complaints this visit and has taken BP medications this am. Patient had lab work drawn today per order.  BP = 134/90 P= 70  Per Dr. Charlett Blake patient to continue taking medications as ordered. Minimize st and  Return for OV in 6 months

## 2017-10-12 NOTE — Progress Notes (Signed)
nurse blood pressure check note reviewed. Agree with documention and plan.

## 2017-10-13 LAB — HEPATITIS C ANTIBODY
Hepatitis C Ab: NONREACTIVE
SIGNAL TO CUT-OFF: 0.01 (ref <1.00)

## 2017-10-14 ENCOUNTER — Other Ambulatory Visit: Payer: Self-pay | Admitting: Family Medicine

## 2017-10-17 ENCOUNTER — Encounter: Payer: Self-pay | Admitting: Family Medicine

## 2017-10-17 DIAGNOSIS — R972 Elevated prostate specific antigen [PSA]: Secondary | ICD-10-CM

## 2017-10-28 ENCOUNTER — Encounter: Payer: Self-pay | Admitting: Family Medicine

## 2017-11-14 ENCOUNTER — Other Ambulatory Visit: Payer: Self-pay | Admitting: Family Medicine

## 2017-11-28 DIAGNOSIS — E291 Testicular hypofunction: Secondary | ICD-10-CM | POA: Diagnosis not present

## 2017-11-28 DIAGNOSIS — R972 Elevated prostate specific antigen [PSA]: Secondary | ICD-10-CM | POA: Diagnosis not present

## 2017-11-28 DIAGNOSIS — R351 Nocturia: Secondary | ICD-10-CM | POA: Diagnosis not present

## 2017-11-28 DIAGNOSIS — N401 Enlarged prostate with lower urinary tract symptoms: Secondary | ICD-10-CM | POA: Diagnosis not present

## 2017-11-29 ENCOUNTER — Other Ambulatory Visit: Payer: Self-pay | Admitting: Urology

## 2017-11-29 DIAGNOSIS — R972 Elevated prostate specific antigen [PSA]: Secondary | ICD-10-CM

## 2017-12-04 ENCOUNTER — Encounter: Payer: Self-pay | Admitting: Family Medicine

## 2017-12-04 ENCOUNTER — Other Ambulatory Visit: Payer: Self-pay | Admitting: Family Medicine

## 2017-12-04 DIAGNOSIS — R739 Hyperglycemia, unspecified: Secondary | ICD-10-CM

## 2017-12-04 DIAGNOSIS — R5383 Other fatigue: Secondary | ICD-10-CM

## 2017-12-04 DIAGNOSIS — N649 Disorder of breast, unspecified: Secondary | ICD-10-CM

## 2017-12-04 DIAGNOSIS — R0683 Snoring: Secondary | ICD-10-CM

## 2017-12-04 DIAGNOSIS — I10 Essential (primary) hypertension: Secondary | ICD-10-CM

## 2017-12-04 DIAGNOSIS — N529 Male erectile dysfunction, unspecified: Secondary | ICD-10-CM

## 2017-12-04 DIAGNOSIS — E663 Overweight: Secondary | ICD-10-CM

## 2017-12-06 NOTE — Telephone Encounter (Signed)
Duplicate request for losartan- already refilled for today.

## 2017-12-07 MED ORDER — SILDENAFIL CITRATE 100 MG PO TABS: 100.0000 mg | ORAL_TABLET | Freq: Every day | ORAL | 1 refills | Status: DC | PRN

## 2017-12-07 NOTE — Telephone Encounter (Signed)
Please send him in a prescription for generic Sildenafil 100 mg tabs, 1 tab po daily prn ED. Disp #30 with 1 rf and let him know

## 2017-12-07 NOTE — Telephone Encounter (Signed)
Rx sent. Pt informed via MyChart.

## 2017-12-11 ENCOUNTER — Encounter: Payer: Self-pay | Admitting: Medical

## 2017-12-11 ENCOUNTER — Ambulatory Visit (INDEPENDENT_AMBULATORY_CARE_PROVIDER_SITE_OTHER): Payer: BLUE CROSS/BLUE SHIELD | Admitting: Medical

## 2017-12-11 ENCOUNTER — Ambulatory Visit (HOSPITAL_BASED_OUTPATIENT_CLINIC_OR_DEPARTMENT_OTHER)
Admission: RE | Admit: 2017-12-11 | Discharge: 2017-12-11 | Disposition: A | Discharge status: Home / Self Care | Payer: BLUE CROSS/BLUE SHIELD | Source: Ambulatory Visit | Attending: Medical | Admitting: Medical

## 2017-12-11 VITALS — BP 140/90 | HR 84 | Temp 98.1°F | Resp 16 | Ht 74.0 in | Wt 254.6 lb

## 2017-12-11 DIAGNOSIS — R05 Cough: Secondary | ICD-10-CM

## 2017-12-11 DIAGNOSIS — R059 Cough, unspecified: Secondary | ICD-10-CM

## 2017-12-11 DIAGNOSIS — J40 Bronchitis, not specified as acute or chronic: Secondary | ICD-10-CM | POA: Diagnosis not present

## 2017-12-11 MED ORDER — ALBUTEROL SULFATE HFA 108 (90 BASE) MCG/ACT IN AERS: 2.0000 | INHALATION_SPRAY | Freq: Four times a day (QID) | RESPIRATORY_TRACT | 2 refills | Status: DC | PRN

## 2017-12-11 MED ORDER — DOXYCYCLINE HYCLATE 100 MG PO TABS: 100.0000 mg | ORAL_TABLET | Freq: Two times a day (BID) | ORAL | 0 refills | Status: DC

## 2017-12-11 MED ORDER — PREDNISONE 10 MG PO TABS: ORAL_TABLET | ORAL | 0 refills | Status: DC

## 2017-12-11 MED ORDER — HYDROCODONE-HOMATROPINE 5-1.5 MG/5ML PO SYRP: 5.0000 mL | ORAL_SOLUTION | Freq: Three times a day (TID) | ORAL | 0 refills | Status: DC | PRN

## 2017-12-11 NOTE — Patient Instructions (Signed)
You appear to have bronchitis with some  sinusitis  Rest hydrate and tylenol for fever. I am prescribing cough medicine, and antibiotic. For your nasal congestion use your flonase.  You do have rough breath sounds with some wheezing. Will rx albuterol inhaler for wheezing. If having to use albuterol every 6 hours then start taper prednisone.  Recommend getting cxr today.  Follow up in 7-10 days or as needed

## 2017-12-11 NOTE — Progress Notes (Signed)
Subjective:    Patient ID: Joseph Fowler, male    DOB: 04/29/1956, 61 y.o.   MRN: [id number]  HPI  Pt in for recent nasal congestion and head cold type symptoms since past Thursday. Pt tried delsym. Pt using flonase.  Pt states getting chest congestion and productive cough.   Some mild occasional wheeze.   No fever, no chills or sweats.  Hx of smoking in the past. Quit smoking in 1983.   Review of Systems  Constitutional: Positive for chills, diaphoresis and fatigue. Negative for unexpected weight change.       Mild transient sweats yesterday.  HENT: Positive for congestion, sinus pressure and sinus pain. Negative for mouth sores and postnasal drip.   Respiratory: Positive for cough and wheezing. Negative for shortness of breath.        Chest congested.  Cardiovascular: Negative for chest pain and palpitations.  Gastrointestinal: Negative for abdominal pain.  Musculoskeletal: Negative for back pain.  Neurological: Negative for dizziness, syncope, weakness and light-headedness.  Hematological: Negative for adenopathy. Does not bruise/bleed easily.   Past Medical History:  Diagnosis Date  . Allergy   . Arthritis 09/06/2013  . Blood in stool 12/03/2012  . BPH (benign prostatic hyperplasia) 09/12/2012  . Chicken pox as a child  . Dermatitis 09/12/2012  . ED (erectile dysfunction) 09/12/2012  . Hyperlipidemia   . Hypertension 61 yrs old  . Nocturia 09/12/2012  . Obesity 09/12/2012  . Overweight(278.02) 09/12/2012  . Preventative health care 09/12/2012  . Snoring 09/12/2012     Social History   Socioeconomic History  . Marital status: Married    Spouse name: Not on file  . Number of children: Not on file  . Years of education: Not on file  . Highest education level: Not on file  Social Needs  . Financial resource strain: Not on file  . Food insecurity - worry: Not on file  . Food insecurity - inability: Not on file  . Transportation needs - medical: Not on file  .  Transportation needs - non-medical: Not on file  Occupational History  . Not on file  Tobacco Use  . Smoking status: Former Smoker    Packs/day: 2.00    Years: 8.00    Pack years: 16.00    Types: Cigarettes    Last attempt to quit: 12/26/1981    Years since quitting: 35.9  . Smokeless tobacco: Never Used  Substance and Sexual Activity  . Alcohol use: No  . Drug use: No  . Sexual activity: Yes  Other Topics Concern  . Not on file  Social History Narrative  . Not on file    Past Surgical History:  Procedure Laterality Date  . APPENDECTOMY  61 yrs old  . EYE SURGERY  2005   ,right, injury, decreased visual acuity, photophobia  . HERNIA REPAIR    . PROSTATE BIOPSY    . torn meninscus  2011   knee- right  . VASECTOMY  61 yrs old    Family History  Problem Relation Age of Onset  . Diabetes Father        type 2  . Other Brother        whole in heart  . Heart disease Brother        sudden cardiac death  . Stroke Maternal Grandmother        X 2  . Arthritis Paternal Uncle   . Colon cancer Neg Hx     No Known Allergies  Current Outpatient Medications on File Prior to Visit  Medication Sig Dispense Refill  . amLODipine (NORVASC) 5 MG tablet TAKE 1 TABLET BY MOUTH ONCE DAILY 30 tablet 0  . atorvastatin (LIPITOR) 10 MG tablet Take 1 tablet (10 mg total) by mouth daily. 30 tablet 1  . Cyanocobalamin (VITAMIN B-12 CR PO) Take 1 tablet by mouth daily.    Nyoka Cowden Tea, Camillia sinensis, (GREEN TEA PO) Take 2 capsules by mouth daily.    Marland Kitchen loratadine (CLARITIN) 10 MG tablet Take 1 tablet (10 mg total) by mouth daily. 90 tablet 1  . losartan (COZAAR) 100 MG tablet TAKE 1 TABLET BY MOUTH ONCE DAILY 90 tablet 0  . Miconazole Nitrate 2 % LOTN Apply to lower legs daily after shower for 30 days. 56 g 1  . Multiple Vitamin (MULTIVITAMIN) tablet Take 1 tablet by mouth daily.    . sildenafil (VIAGRA) 100 MG tablet Take 1 tablet (100 mg total) by mouth daily as needed for erectile  dysfunction. 30 tablet 1  . triamterene-hydrochlorothiazide (MAXZIDE-25) 37.5-25 MG tablet Take 1 tablet by mouth daily. 30 tablet 0  . vitamin C (ASCORBIC ACID) 500 MG tablet Take 500 mg by mouth daily.    Marland Kitchen VITAMIN D, ERGOCALCIFEROL, PO Take 1 capsule by mouth daily.    . Vitamins C E (CRANBERRY CONCENTRATE PO) Take 1 tablet by mouth daily.     No current facility-administered medications on file prior to visit.     BP (!) 159/98   Pulse 84   Temp 98.1 F (36.7 C) (Oral)   Resp 16   Ht 6\' 2"  (1.88 m)   Wt 254 lb 9.6 oz (115.5 kg)   SpO2 100%   BMI 32.69 kg/m      Objective:   Physical Exam  General  Mental Status - Alert. General Appearance - Well groomed. Not in acute distress.  Skin Rashes- No Rashes.  HEENT Head- Normal. Ear Auditory Canal - Left- Normal. Right - Normal.Tympanic Membrane- Left- Normal. Right- Normal. Eye Sclera/Conjunctiva- Left- Normal. Right- Normal. Nose & Sinuses Nasal Mucosa- Left-  Boggy and Congested. Right-  Boggy and  Congested.Bilateral no maxillary but  frontal sinus pressure. Mouth & Throat Lips: Upper Lip- Normal: no dryness, cracking, pallor, cyanosis, or vesicular eruption. Lower Lip-Normal: no dryness, cracking, pallor, cyanosis or vesicular eruption. Buccal Mucosa- Bilateral- No Aphthous ulcers. Oropharynx- No Discharge or Erythema. Tonsils: Characteristics- Bilateral- No Erythema or Congestion. Size/Enlargement- Bilateral- No enlargement. Discharge- bilateral-None.  Neck Neck- Supple. No Masses.   Chest and Lung Exam Auscultation: Breath Sounds:- even and unlabored. Scattered rough breath sounds.  Cardiovascular Auscultation:Rythm- Regular, rate and rhythm. Murmurs & Other Heart Sounds:Ausculatation of the heart reveal- No Murmurs.  Lymphatic Head & Neck General Head & Neck Lymphatics: Bilateral: Description- No Localized lymphadenopathy.       Assessment & Plan:  You appear to have bronchitis with some  sinusitis   Rest hydrate and tylenol for fever. I am prescribing cough medicine, and antibiotic. For your nasal congestion use your flonase.  You do have rough breath sounds with some wheezing. Will rx albuterol inhaler for wheezing. If having to use albuterol every 6 hours then start taper prednisone.  Recommend getting cxr today.  Follow up in 7-10 days or as needed  Eilan Mcinerny, Percell Miller, [employer]

## 2017-12-13 ENCOUNTER — Other Ambulatory Visit: Payer: Self-pay | Admitting: Family Medicine

## 2017-12-22 ENCOUNTER — Other Ambulatory Visit: Payer: BLUE CROSS/BLUE SHIELD

## 2018-01-02 ENCOUNTER — Ambulatory Visit
Admission: RE | Admit: 2018-01-02 | Discharge: 2018-01-02 | Disposition: A | Discharge status: Home / Self Care | Payer: BLUE CROSS/BLUE SHIELD | Source: Ambulatory Visit | Attending: Urology | Admitting: Urology

## 2018-01-02 DIAGNOSIS — R972 Elevated prostate specific antigen [PSA]: Secondary | ICD-10-CM

## 2018-01-02 MED ORDER — GADOBENATE DIMEGLUMINE 529 MG/ML IV SOLN
20.0000 mL | Freq: Once | INTRAVENOUS | Status: AC | PRN
  Administered 2018-01-02: 20 mL via INTRAVENOUS

## 2018-01-15 ENCOUNTER — Telehealth: Payer: Self-pay | Admitting: Family Medicine

## 2018-01-15 NOTE — Telephone Encounter (Signed)
Pt will call back to reschedule the appt on 04/03/18. He needs to check his calendar.

## 2018-01-17 ENCOUNTER — Other Ambulatory Visit: Payer: Self-pay | Admitting: Family Medicine

## 2018-01-23 DIAGNOSIS — R972 Elevated prostate specific antigen [PSA]: Secondary | ICD-10-CM | POA: Diagnosis not present

## 2018-01-23 DIAGNOSIS — E291 Testicular hypofunction: Secondary | ICD-10-CM | POA: Diagnosis not present

## 2018-01-23 DIAGNOSIS — R350 Frequency of micturition: Secondary | ICD-10-CM | POA: Diagnosis not present

## 2018-01-23 DIAGNOSIS — N401 Enlarged prostate with lower urinary tract symptoms: Secondary | ICD-10-CM | POA: Diagnosis not present

## 2018-01-23 DIAGNOSIS — R3912 Poor urinary stream: Secondary | ICD-10-CM | POA: Diagnosis not present

## 2018-02-05 ENCOUNTER — Other Ambulatory Visit: Payer: Self-pay | Admitting: Family Medicine

## 2018-02-12 ENCOUNTER — Other Ambulatory Visit: Payer: Self-pay | Admitting: Family Medicine

## 2018-02-19 DIAGNOSIS — R972 Elevated prostate specific antigen [PSA]: Secondary | ICD-10-CM | POA: Diagnosis not present

## 2018-03-14 ENCOUNTER — Other Ambulatory Visit: Payer: Self-pay | Admitting: Family Medicine

## 2018-03-14 DIAGNOSIS — R5383 Other fatigue: Secondary | ICD-10-CM

## 2018-03-14 DIAGNOSIS — R0683 Snoring: Secondary | ICD-10-CM

## 2018-03-14 DIAGNOSIS — I10 Essential (primary) hypertension: Secondary | ICD-10-CM

## 2018-03-14 DIAGNOSIS — N529 Male erectile dysfunction, unspecified: Secondary | ICD-10-CM

## 2018-03-14 DIAGNOSIS — R739 Hyperglycemia, unspecified: Secondary | ICD-10-CM

## 2018-03-14 DIAGNOSIS — E663 Overweight: Secondary | ICD-10-CM

## 2018-03-14 DIAGNOSIS — N649 Disorder of breast, unspecified: Secondary | ICD-10-CM

## 2018-03-16 ENCOUNTER — Other Ambulatory Visit: Payer: Self-pay | Admitting: Family Medicine

## 2018-04-03 ENCOUNTER — Ambulatory Visit: Payer: BLUE CROSS/BLUE SHIELD | Admitting: Family Medicine

## 2018-04-10 ENCOUNTER — Encounter: Payer: Self-pay | Admitting: Family Medicine

## 2018-04-18 ENCOUNTER — Other Ambulatory Visit: Payer: Self-pay | Admitting: Family Medicine

## 2018-04-18 DIAGNOSIS — I10 Essential (primary) hypertension: Secondary | ICD-10-CM

## 2018-04-18 DIAGNOSIS — R739 Hyperglycemia, unspecified: Secondary | ICD-10-CM

## 2018-04-18 DIAGNOSIS — N529 Male erectile dysfunction, unspecified: Secondary | ICD-10-CM

## 2018-04-18 DIAGNOSIS — R5383 Other fatigue: Secondary | ICD-10-CM

## 2018-04-18 DIAGNOSIS — R0683 Snoring: Secondary | ICD-10-CM

## 2018-04-18 DIAGNOSIS — E663 Overweight: Secondary | ICD-10-CM

## 2018-04-18 DIAGNOSIS — N649 Disorder of breast, unspecified: Secondary | ICD-10-CM

## 2018-05-17 ENCOUNTER — Other Ambulatory Visit: Payer: Self-pay | Admitting: Family Medicine

## 2018-05-17 DIAGNOSIS — N649 Disorder of breast, unspecified: Secondary | ICD-10-CM

## 2018-05-17 DIAGNOSIS — E663 Overweight: Secondary | ICD-10-CM

## 2018-05-17 DIAGNOSIS — R0683 Snoring: Secondary | ICD-10-CM

## 2018-05-17 DIAGNOSIS — I10 Essential (primary) hypertension: Secondary | ICD-10-CM

## 2018-05-17 DIAGNOSIS — N529 Male erectile dysfunction, unspecified: Secondary | ICD-10-CM

## 2018-05-17 DIAGNOSIS — R5383 Other fatigue: Secondary | ICD-10-CM

## 2018-05-17 DIAGNOSIS — R739 Hyperglycemia, unspecified: Secondary | ICD-10-CM

## 2018-06-14 ENCOUNTER — Other Ambulatory Visit: Payer: Self-pay | Admitting: Family Medicine

## 2018-06-14 DIAGNOSIS — R0683 Snoring: Secondary | ICD-10-CM

## 2018-06-14 DIAGNOSIS — N529 Male erectile dysfunction, unspecified: Secondary | ICD-10-CM

## 2018-06-14 DIAGNOSIS — E663 Overweight: Secondary | ICD-10-CM

## 2018-06-14 DIAGNOSIS — R5383 Other fatigue: Secondary | ICD-10-CM

## 2018-06-14 DIAGNOSIS — N649 Disorder of breast, unspecified: Secondary | ICD-10-CM

## 2018-06-14 DIAGNOSIS — R739 Hyperglycemia, unspecified: Secondary | ICD-10-CM

## 2018-06-14 DIAGNOSIS — I10 Essential (primary) hypertension: Secondary | ICD-10-CM

## 2018-06-19 ENCOUNTER — Other Ambulatory Visit: Payer: Self-pay | Admitting: Family Medicine

## 2018-06-19 DIAGNOSIS — N649 Disorder of breast, unspecified: Secondary | ICD-10-CM

## 2018-06-19 DIAGNOSIS — N529 Male erectile dysfunction, unspecified: Secondary | ICD-10-CM

## 2018-06-19 DIAGNOSIS — E663 Overweight: Secondary | ICD-10-CM

## 2018-06-19 DIAGNOSIS — R739 Hyperglycemia, unspecified: Secondary | ICD-10-CM

## 2018-06-19 DIAGNOSIS — I10 Essential (primary) hypertension: Secondary | ICD-10-CM

## 2018-06-19 DIAGNOSIS — R5383 Other fatigue: Secondary | ICD-10-CM

## 2018-06-19 DIAGNOSIS — R0683 Snoring: Secondary | ICD-10-CM

## 2018-07-21 ENCOUNTER — Other Ambulatory Visit: Payer: Self-pay | Admitting: Family Medicine

## 2018-07-21 DIAGNOSIS — R5383 Other fatigue: Secondary | ICD-10-CM

## 2018-07-21 DIAGNOSIS — R0683 Snoring: Secondary | ICD-10-CM

## 2018-07-21 DIAGNOSIS — I10 Essential (primary) hypertension: Secondary | ICD-10-CM

## 2018-07-21 DIAGNOSIS — R739 Hyperglycemia, unspecified: Secondary | ICD-10-CM

## 2018-07-21 DIAGNOSIS — E663 Overweight: Secondary | ICD-10-CM

## 2018-07-21 DIAGNOSIS — N649 Disorder of breast, unspecified: Secondary | ICD-10-CM

## 2018-07-21 DIAGNOSIS — N529 Male erectile dysfunction, unspecified: Secondary | ICD-10-CM

## 2018-07-31 DIAGNOSIS — R972 Elevated prostate specific antigen [PSA]: Secondary | ICD-10-CM | POA: Diagnosis not present

## 2018-07-31 DIAGNOSIS — C61 Malignant neoplasm of prostate: Secondary | ICD-10-CM | POA: Diagnosis not present

## 2018-08-21 ENCOUNTER — Other Ambulatory Visit: Payer: Self-pay | Admitting: Family Medicine

## 2018-08-21 DIAGNOSIS — N529 Male erectile dysfunction, unspecified: Secondary | ICD-10-CM

## 2018-08-21 DIAGNOSIS — R5383 Other fatigue: Secondary | ICD-10-CM

## 2018-08-21 DIAGNOSIS — R739 Hyperglycemia, unspecified: Secondary | ICD-10-CM

## 2018-08-21 DIAGNOSIS — R0683 Snoring: Secondary | ICD-10-CM

## 2018-08-21 DIAGNOSIS — I10 Essential (primary) hypertension: Secondary | ICD-10-CM

## 2018-08-21 DIAGNOSIS — E663 Overweight: Secondary | ICD-10-CM

## 2018-08-21 DIAGNOSIS — N649 Disorder of breast, unspecified: Secondary | ICD-10-CM

## 2018-08-22 DIAGNOSIS — C61 Malignant neoplasm of prostate: Secondary | ICD-10-CM | POA: Diagnosis not present

## 2018-08-31 DIAGNOSIS — C61 Malignant neoplasm of prostate: Secondary | ICD-10-CM | POA: Diagnosis not present

## 2018-08-31 DIAGNOSIS — R972 Elevated prostate specific antigen [PSA]: Secondary | ICD-10-CM | POA: Diagnosis not present

## 2018-08-31 DIAGNOSIS — Z87891 Personal history of nicotine dependence: Secondary | ICD-10-CM | POA: Diagnosis not present

## 2018-09-13 DIAGNOSIS — C61 Malignant neoplasm of prostate: Secondary | ICD-10-CM | POA: Diagnosis not present

## 2018-09-17 ENCOUNTER — Other Ambulatory Visit: Payer: Self-pay | Admitting: Family Medicine

## 2018-09-17 DIAGNOSIS — N529 Male erectile dysfunction, unspecified: Secondary | ICD-10-CM

## 2018-09-17 DIAGNOSIS — E663 Overweight: Secondary | ICD-10-CM

## 2018-09-17 DIAGNOSIS — R0683 Snoring: Secondary | ICD-10-CM

## 2018-09-17 DIAGNOSIS — N649 Disorder of breast, unspecified: Secondary | ICD-10-CM

## 2018-09-17 DIAGNOSIS — I10 Essential (primary) hypertension: Secondary | ICD-10-CM

## 2018-09-17 DIAGNOSIS — R5383 Other fatigue: Secondary | ICD-10-CM

## 2018-09-17 DIAGNOSIS — R739 Hyperglycemia, unspecified: Secondary | ICD-10-CM

## 2018-09-20 ENCOUNTER — Other Ambulatory Visit: Payer: Self-pay | Admitting: Family Medicine

## 2018-09-21 ENCOUNTER — Telehealth: Payer: Self-pay | Admitting: Family Medicine

## 2018-09-21 DIAGNOSIS — R739 Hyperglycemia, unspecified: Secondary | ICD-10-CM

## 2018-09-21 DIAGNOSIS — E663 Overweight: Secondary | ICD-10-CM

## 2018-09-21 DIAGNOSIS — N529 Male erectile dysfunction, unspecified: Secondary | ICD-10-CM

## 2018-09-21 DIAGNOSIS — I10 Essential (primary) hypertension: Secondary | ICD-10-CM

## 2018-09-21 DIAGNOSIS — R5383 Other fatigue: Secondary | ICD-10-CM

## 2018-09-21 DIAGNOSIS — N649 Disorder of breast, unspecified: Secondary | ICD-10-CM

## 2018-09-21 DIAGNOSIS — R0683 Snoring: Secondary | ICD-10-CM

## 2018-09-21 NOTE — Telephone Encounter (Signed)
Left patient a message to call us back to schedule cpe with Dr. Charlett Blake.

## 2018-09-21 NOTE — Telephone Encounter (Signed)
Can you call patient and have him scheduled for a CPE patient has not been seen in over 1 year by PCP and needs to come in for further refills.  I have cut patients rx down to 15 days he makes appointment then we will send more medication to last until appointment.    Please let me know if patient has made appointment to further his refills

## 2018-10-09 ENCOUNTER — Other Ambulatory Visit: Payer: Self-pay | Admitting: Family Medicine

## 2018-10-09 DIAGNOSIS — I10 Essential (primary) hypertension: Secondary | ICD-10-CM

## 2018-10-09 DIAGNOSIS — R5383 Other fatigue: Secondary | ICD-10-CM

## 2018-10-09 DIAGNOSIS — N649 Disorder of breast, unspecified: Secondary | ICD-10-CM

## 2018-10-09 DIAGNOSIS — R0683 Snoring: Secondary | ICD-10-CM

## 2018-10-09 DIAGNOSIS — R739 Hyperglycemia, unspecified: Secondary | ICD-10-CM

## 2018-10-09 DIAGNOSIS — N529 Male erectile dysfunction, unspecified: Secondary | ICD-10-CM

## 2018-10-09 DIAGNOSIS — E663 Overweight: Secondary | ICD-10-CM

## 2018-10-11 ENCOUNTER — Encounter: Payer: Self-pay | Admitting: Family Medicine

## 2018-10-11 ENCOUNTER — Ambulatory Visit (INDEPENDENT_AMBULATORY_CARE_PROVIDER_SITE_OTHER): Payer: BLUE CROSS/BLUE SHIELD | Admitting: Family Medicine

## 2018-10-11 VITALS — BP 142/70 | HR 69 | Temp 97.9°F | Resp 18 | Ht 74.0 in | Wt 250.4 lb

## 2018-10-11 DIAGNOSIS — R739 Hyperglycemia, unspecified: Secondary | ICD-10-CM

## 2018-10-11 DIAGNOSIS — Z0000 Encounter for general adult medical examination without abnormal findings: Secondary | ICD-10-CM

## 2018-10-11 DIAGNOSIS — Z23 Encounter for immunization: Secondary | ICD-10-CM

## 2018-10-11 DIAGNOSIS — E782 Mixed hyperlipidemia: Secondary | ICD-10-CM | POA: Diagnosis not present

## 2018-10-11 DIAGNOSIS — E663 Overweight: Secondary | ICD-10-CM

## 2018-10-11 DIAGNOSIS — I10 Essential (primary) hypertension: Secondary | ICD-10-CM

## 2018-10-11 DIAGNOSIS — N649 Disorder of breast, unspecified: Secondary | ICD-10-CM

## 2018-10-11 DIAGNOSIS — C61 Malignant neoplasm of prostate: Secondary | ICD-10-CM

## 2018-10-11 DIAGNOSIS — R0683 Snoring: Secondary | ICD-10-CM

## 2018-10-11 DIAGNOSIS — N529 Male erectile dysfunction, unspecified: Secondary | ICD-10-CM

## 2018-10-11 DIAGNOSIS — R5383 Other fatigue: Secondary | ICD-10-CM

## 2018-10-11 MED ORDER — TRIAMTERENE-HCTZ 37.5-25 MG PO TABS: 1.0000 | ORAL_TABLET | Freq: Every day | ORAL | 1 refills | Status: DC

## 2018-10-11 MED ORDER — AMLODIPINE BESYLATE 5 MG PO TABS
ORAL_TABLET | ORAL | 1 refills | Status: DC
Start: 2018-10-11 — End: 2019-04-08

## 2018-10-11 MED ORDER — ATORVASTATIN CALCIUM 10 MG PO TABS: ORAL_TABLET | ORAL | 1 refills | Status: DC

## 2018-10-11 MED ORDER — LOSARTAN POTASSIUM 100 MG PO TABS: 100.0000 mg | ORAL_TABLET | Freq: Every day | ORAL | 1 refills | Status: DC

## 2018-10-11 NOTE — Progress Notes (Signed)
Subjective:    Patient ID: Joseph Fowler, male    DOB: 1956-12-25, 62 y.o.   MRN: [id number]  No chief complaint on file.   HPI Patient is in today for annual preventative exam and follow up. He feels well today.and is staying active. He uses. No polyuria or polydipsia. Encouraged increased hydration, 64 ounces of clear fluids daily. Minimize alcohol and caffeine. Eat small frequent meals with lean proteins and complex carbs. Avoid high and low blood sugars. Get adequate sleep, 7-8 hours a night. Needs exercise daily preferably in the morning. Is trying to maintain a heat healthy diet. Is very stressed by his prostate cancer diagnosis and he is scheduled for a consultation at [facility] for a second opinion in November. He does endorse some urinary frequency but Flomax has bee helpful.he is doing well with activities of daily living. Denies CP/palp/SOB/HA/congestion/fevers/GI or GU c/o. Taking meds as prescribed  Past Medical History:  Diagnosis Date  . Allergy   . Arthritis 09/06/2013  . Blood in stool 12/03/2012  . BPH (benign prostatic hyperplasia) 09/12/2012  . Chicken pox as a child  . Dermatitis 09/12/2012  . ED (erectile dysfunction) 09/12/2012  . Hyperlipidemia   . Hypertension 62 yrs old  . Nocturia 09/12/2012  . Obesity 09/12/2012  . Overweight(278.02) 09/12/2012  . Preventative health care 09/12/2012  . Snoring 09/12/2012    Past Surgical History:  Procedure Laterality Date  . APPENDECTOMY  62 yrs old  . EYE SURGERY  2005   ,right, injury, decreased visual acuity, photophobia  . HERNIA REPAIR    . PROSTATE BIOPSY    . torn meninscus  2011   knee- right  . VASECTOMY  62 yrs old    Family History  Problem Relation Age of Onset  . Diabetes Father        type 2  . Other Brother        whole in heart  . Heart disease Brother        sudden cardiac death  . Stroke Maternal Grandmother        X 2  . Arthritis Paternal Uncle   . Colon cancer Neg Hx     Social History    Socioeconomic History  . Marital status: Married    Spouse name: Not on file  . Number of children: Not on file  . Years of education: Not on file  . Highest education level: Not on file  Occupational History  . Not on file  Social Needs  . Financial resource strain: Not on file  . Food insecurity:    Worry: Not on file    Inability: Not on file  . Transportation needs:    Medical: Not on file    Non-medical: Not on file  Tobacco Use  . Smoking status: Former Smoker    Packs/day: 2.00    Years: 8.00    Pack years: 16.00    Types: Cigarettes    Last attempt to quit: 12/26/1981    Years since quitting: 36.8  . Smokeless tobacco: Never Used  Substance and Sexual Activity  . Alcohol use: No  . Drug use: No  . Sexual activity: Yes  Lifestyle  . Physical activity:    Days per week: Not on file    Minutes per session: Not on file  . Stress: Not on file  Relationships  . Social connections:    Talks on phone: Not on file    Gets together: Not on file  Attends religious service: Not on file    Active member of club or organization: Not on file    Attends meetings of clubs or organizations: Not on file    Relationship status: Not on file  . Intimate partner violence:    Fear of current or ex partner: Not on file    Emotionally abused: Not on file    Physically abused: Not on file    Forced sexual activity: Not on file  Other Topics Concern  . Not on file  Social History Narrative  . Not on file    Outpatient Medications Prior to Visit  Medication Sig Dispense Refill  . tamsulosin (FLOMAX) 0.4 MG CAPS capsule Take by mouth.    Marland Kitchen albuterol (PROVENTIL HFA;VENTOLIN HFA) 108 (90 Base) MCG/ACT inhaler Inhale 2 puffs into the lungs every 6 (six) hours as needed for wheezing or shortness of breath. 1 Inhaler 2  . Cyanocobalamin (VITAMIN B-12 CR PO) Take 1 tablet by mouth daily.    Marland Kitchen doxycycline (VIBRA-TABS) 100 MG tablet Take 1 tablet (100 mg total) by mouth 2 (two) times  daily. 20 tablet 0  . Green Tea, Camillia sinensis, (GREEN TEA PO) Take 2 capsules by mouth daily.    Marland Kitchen HYDROcodone-homatropine (HYCODAN) 5-1.5 MG/5ML syrup Take 5 mLs by mouth every 8 (eight) hours as needed for cough. 100 mL 0  . loratadine (CLARITIN) 10 MG tablet Take 1 tablet (10 mg total) by mouth daily. 90 tablet 1  . Miconazole Nitrate 2 % LOTN Apply to lower legs daily after shower for 30 days. 56 g 1  . Multiple Vitamin (MULTIVITAMIN) tablet Take 1 tablet by mouth daily.    . predniSONE (DELTASONE) 10 MG tablet 6 TAB PO DAY 1 5 TAB PO DAY 2 4 TAB PO DAY 3 3 TAB PO DAY 4 2 TAB PO DAY 5 1 TAB PO DAY 6 21 tablet 0  . sildenafil (VIAGRA) 100 MG tablet Take 1 tablet (100 mg total) by mouth daily as needed for erectile dysfunction. 30 tablet 1  . vitamin C (ASCORBIC ACID) 500 MG tablet Take 500 mg by mouth daily.    Marland Kitchen VITAMIN D, ERGOCALCIFEROL, PO Take 1 capsule by mouth daily.    . Vitamins C E (CRANBERRY CONCENTRATE PO) Take 1 tablet by mouth daily.    Marland Kitchen amLODipine (NORVASC) 5 MG tablet TAKE 1 TABLET BY MOUTH ONCE DAILY .  MUST  HAVE  OFFICE  VISIT  BEFORE  FURTHER  REFILLS. 15 tablet 0  . atorvastatin (LIPITOR) 10 MG tablet TAKE 1 TABLET BY MOUTH ONCE DAILY . APPOINTMENT REQUIRED FOR FUTURE REFILLS 15 tablet 0  . losartan (COZAAR) 100 MG tablet Take 1 tablet (100 mg total) by mouth daily. 30 tablet 0  . triamterene-hydrochlorothiazide (MAXZIDE-25) 37.5-25 MG tablet Take 1 tablet by mouth daily. NEEDS OV for Further Refills 15 tablet 0   No facility-administered medications prior to visit.     No Known Allergies  Review of Systems  Constitutional: Negative for chills, fever and malaise/fatigue.  HENT: Negative for congestion and hearing loss.   Eyes: Negative for discharge.  Respiratory: Negative for cough, sputum production and shortness of breath.   Cardiovascular: Negative for chest pain, palpitations and leg swelling.  Gastrointestinal: Negative for abdominal pain, blood in  stool, constipation, diarrhea, heartburn, nausea and vomiting.  Genitourinary: Negative for dysuria, frequency, hematuria and urgency.  Musculoskeletal: Negative for back pain, falls and myalgias.  Skin: Negative for rash.  Neurological: Negative for dizziness,  sensory change, loss of consciousness, weakness and headaches.  Endo/Heme/Allergies: Negative for environmental allergies. Does not bruise/bleed easily.  Psychiatric/Behavioral: Negative for depression and suicidal ideas. The patient is not nervous/anxious and does not have insomnia.        Objective:    Physical Exam  Constitutional: He is oriented to person, place, and time. He appears well-developed and well-nourished. No distress.  HENT:  Head: Normocephalic and atraumatic.  Nose: Nose normal.  Eyes: Right eye exhibits no discharge. Left eye exhibits no discharge.  Neck: Normal range of motion. Neck supple.  Cardiovascular: Normal rate and regular rhythm.  No murmur heard. Pulmonary/Chest: Effort normal and breath sounds normal.  Abdominal: Soft. Bowel sounds are normal. There is no tenderness.  Musculoskeletal: He exhibits no edema.  Neurological: He is alert and oriented to person, place, and time.  Skin: Skin is warm and dry.  Psychiatric: He has a normal mood and affect.  Nursing note and vitals reviewed.   BP (!) 142/70 (BP Location: Left Arm, Patient Position: Sitting, Cuff Size: Normal)   Pulse 69   Temp 97.9 F (36.6 C) (Oral)   Resp 18   Ht 6\' 2"  (1.88 m)   Wt 250 lb 6.4 oz (113.6 kg)   SpO2 98%   BMI 32.15 kg/m  Wt Readings from Last 3 Encounters:  10/11/18 250 lb 6.4 oz (113.6 kg)  12/11/17 254 lb 9.6 oz (115.5 kg)  10/03/17 247 lb 12.8 oz (112.4 kg)     Lab Results  Component Value Date   WBC 7.1 10/12/2017   HGB 14.7 10/12/2017   HCT 43.4 10/12/2017   PLT 219.0 10/12/2017   GLUCOSE 95 10/12/2017   CHOL 116 10/12/2017   TRIG 141.0 10/12/2017   HDL 40.00 10/12/2017   LDLCALC 47 10/12/2017    ALT 23 10/12/2017   AST 21 10/12/2017   NA 137 10/12/2017   K 3.8 10/12/2017   CL 101 10/12/2017   CREATININE 1.09 10/12/2017   BUN 18 10/12/2017   CO2 29 10/12/2017   TSH 1.58 10/12/2017   PSA 7.29 (H) 10/12/2017   HGBA1C 5.7 10/12/2017    Lab Results  Component Value Date   TSH 1.58 10/12/2017   Lab Results  Component Value Date   WBC 7.1 10/12/2017   HGB 14.7 10/12/2017   HCT 43.4 10/12/2017   MCV 90.3 10/12/2017   PLT 219.0 10/12/2017   Lab Results  Component Value Date   NA 137 10/12/2017   K 3.8 10/12/2017   CO2 29 10/12/2017   GLUCOSE 95 10/12/2017   BUN 18 10/12/2017   CREATININE 1.09 10/12/2017   BILITOT 1.2 10/12/2017   ALKPHOS 45 10/12/2017   AST 21 10/12/2017   ALT 23 10/12/2017   PROT 7.2 10/12/2017   ALBUMIN 4.5 10/12/2017   CALCIUM 9.7 10/12/2017   GFR 73.46 10/12/2017   Lab Results  Component Value Date   CHOL 116 10/12/2017   Lab Results  Component Value Date   HDL 40.00 10/12/2017   Lab Results  Component Value Date   LDLCALC 47 10/12/2017   Lab Results  Component Value Date   TRIG 141.0 10/12/2017   Lab Results  Component Value Date   CHOLHDL 3 10/12/2017   Lab Results  Component Value Date   HGBA1C 5.7 10/12/2017       Assessment & Plan:   Problem List Items Addressed This Visit    Hypertension    Well controlled, no changes to meds. Encouraged heart  healthy diet such as the DASH diet and exercise as tolerated.       Relevant Medications   losartan (COZAAR) 100 MG tablet   atorvastatin (LIPITOR) 10 MG tablet   triamterene-hydrochlorothiazide (MAXZIDE-25) 37.5-25 MG tablet   amLODipine (NORVASC) 5 MG tablet   Other Relevant Orders   CBC   Comprehensive metabolic panel   TSH   ED (erectile dysfunction)   Relevant Medications   losartan (COZAAR) 100 MG tablet   triamterene-hydrochlorothiazide (MAXZIDE-25) 37.5-25 MG tablet   Preventative health care     Patient encouraged to maintain heart healthy diet,  regular exercise, adequate sleep. Consider daily probiotics. Take medications as prescribed      Snoring   Relevant Medications   losartan (COZAAR) 100 MG tablet   triamterene-hydrochlorothiazide (MAXZIDE-25) 37.5-25 MG tablet   Hyperglycemia    hgba1c acceptable, minimize simple carbs. Increase exercise as tolerated.       Relevant Medications   losartan (COZAAR) 100 MG tablet   triamterene-hydrochlorothiazide (MAXZIDE-25) 37.5-25 MG tablet   Other Relevant Orders   Hemoglobin A1c   Prostate cancer ([facility])    Is trying to decide whether to proceed with surgery and has a second opinion regarding his treatment in 1 week. No abdominal pain or feves.       Hyperlipidemia, mixed - Primary    Encouraged heart healthy diet, increase exercise, avoid trans fats, consider a krill oil cap daily      Relevant Medications   losartan (COZAAR) 100 MG tablet   atorvastatin (LIPITOR) 10 MG tablet   triamterene-hydrochlorothiazide (MAXZIDE-25) 37.5-25 MG tablet   amLODipine (NORVASC) 5 MG tablet   Other Relevant Orders   Lipid panel    Other Visit Diagnoses    Other fatigue       Relevant Medications   losartan (COZAAR) 100 MG tablet   triamterene-hydrochlorothiazide (MAXZIDE-25) 37.5-25 MG tablet   Breast lesion       Relevant Medications   losartan (COZAAR) 100 MG tablet   triamterene-hydrochlorothiazide (MAXZIDE-25) 37.5-25 MG tablet   Overweight       Relevant Medications   losartan (COZAAR) 100 MG tablet   triamterene-hydrochlorothiazide (MAXZIDE-25) 37.5-25 MG tablet      I have changed Joseph Fowler's atorvastatin, triamterene-hydrochlorothiazide, and amLODipine. I am also having him maintain his multivitamin, (VITAMIN D, ERGOCALCIFEROL, PO), vitamin C, Cyanocobalamin (VITAMIN B-12 CR PO), Vitamins C E (CRANBERRY CONCENTRATE PO), (Green Tea, Camillia sinensis, (GREEN TEA PO)), Miconazole Nitrate, loratadine, sildenafil, HYDROcodone-homatropine, doxycycline, albuterol,  predniSONE, tamsulosin, and losartan.  Meds ordered this encounter  Medications  . losartan (COZAAR) 100 MG tablet    Sig: Take 1 tablet (100 mg total) by mouth daily.    Dispense:  91 tablet    Refill:  1  . atorvastatin (LIPITOR) 10 MG tablet    Sig: TAKE 1 TABLET BY MOUTH ONCE DAILY    Dispense:  90 tablet    Refill:  1  . triamterene-hydrochlorothiazide (MAXZIDE-25) 37.5-25 MG tablet    Sig: Take 1 tablet by mouth daily.    Dispense:  90 tablet    Refill:  1  . amLODipine (NORVASC) 5 MG tablet    Sig: TAKE 1 TABLET BY MOUTH ONCE DAILY .    Dispense:  90 tablet    Refill:  1    Please consider 90 day supplies to promote better adherence     Penni Homans, MD

## 2018-10-11 NOTE — Assessment & Plan Note (Addendum)
Is trying to decide whether to proceed with surgery and has a second opinion regarding his treatment in 1 week. No abdominal pain or feves.

## 2018-10-11 NOTE — Assessment & Plan Note (Signed)
Well controlled, no changes to meds. Encouraged heart healthy diet such as the DASH diet and exercise as tolerated.  °

## 2018-10-11 NOTE — Assessment & Plan Note (Signed)
hgba1c acceptable, minimize simple carbs. Increase exercise as tolerated.  

## 2018-10-11 NOTE — Assessment & Plan Note (Signed)
Patient encouraged to maintain heart healthy diet, regular exercise, adequate sleep. Consider daily probiotics. Take medications as prescribed 

## 2018-10-11 NOTE — Patient Instructions (Addendum)
Shingrix is the shingles shot 2 shots over 2-6 months  Preventive Care 40-64 Years, Male Preventive care refers to lifestyle choices and visits with your health care provider that can promote health and wellness. What does preventive care include?  A yearly physical exam. This is also called an annual well check.  Dental exams once or twice a year.  Routine eye exams. Ask your health care provider how often you should have your eyes checked.  Personal lifestyle choices, including: ? Daily care of your teeth and gums. ? Regular physical activity. ? Eating a healthy diet. ? Avoiding tobacco and drug use. ? Limiting alcohol use. ? Practicing safe sex. ? Taking low-dose aspirin every day starting at age 14. What happens during an annual well check? The services and screenings done by your health care provider during your annual well check will depend on your age, overall health, lifestyle risk factors, and family history of disease. Counseling Your health care provider may ask you questions about your:  Alcohol use.  Tobacco use.  Drug use.  Emotional well-being.  Home and relationship well-being.  Sexual activity.  Eating habits.  Work and work Statistician.  Screening You may have the following tests or measurements:  Height, weight, and BMI.  Blood pressure.  Lipid and cholesterol levels. These may be checked every 5 years, or more frequently if you are over 89 years old.  Skin check.  Lung cancer screening. You may have this screening every year starting at age 78 if you have a 30-pack-year history of smoking and currently smoke or have quit within the past 15 years.  Fecal occult blood test (FOBT) of the stool. You may have this test every year starting at age 55.  Flexible sigmoidoscopy or colonoscopy. You may have a sigmoidoscopy every 5 years or a colonoscopy every 10 years starting at age 35.  Prostate cancer screening. Recommendations will vary  depending on your family history and other risks.  Hepatitis C blood test.  Hepatitis B blood test.  Sexually transmitted disease (STD) testing.  Diabetes screening. This is done by checking your blood sugar (glucose) after you have not eaten for a while (fasting). You may have this done every 1-3 years.  Discuss your test results, treatment options, and if necessary, the need for more tests with your health care provider. Vaccines Your health care provider may recommend certain vaccines, such as:  Influenza vaccine. This is recommended every year.  Tetanus, diphtheria, and acellular pertussis (Tdap, Td) vaccine. You may need a Td booster every 10 years.  Varicella vaccine. You may need this if you have not been vaccinated.  Zoster vaccine. You may need this after age 29.  Measles, mumps, and rubella (MMR) vaccine. You may need at least one dose of MMR if you were born in 1957 or later. You may also need a second dose.  Pneumococcal 13-valent conjugate (PCV13) vaccine. You may need this if you have certain conditions and have not been vaccinated.  Pneumococcal polysaccharide (PPSV23) vaccine. You may need one or two doses if you smoke cigarettes or if you have certain conditions.  Meningococcal vaccine. You may need this if you have certain conditions.  Hepatitis A vaccine. You may need this if you have certain conditions or if you travel or work in places where you may be exposed to hepatitis A.  Hepatitis B vaccine. You may need this if you have certain conditions or if you travel or work in places where you may  be exposed to hepatitis B.  Haemophilus influenzae type b (Hib) vaccine. You may need this if you have certain risk factors.  Talk to your health care provider about which screenings and vaccines you need and how often you need them. This information is not intended to replace advice given to you by your health care provider. Make sure you discuss any questions you have  with your health care provider. Document Released: 01/08/2016 Document Revised: 08/31/2016 Document Reviewed: 10/13/2015 Elsevier Interactive Patient Education  [employer].

## 2018-10-11 NOTE — Assessment & Plan Note (Signed)
Encouraged heart healthy diet, increase exercise, avoid trans fats, consider a krill oil cap daily 

## 2018-10-12 LAB — TSH: TSH: 1.18 u[IU]/mL (ref 0.35–4.50)

## 2018-10-12 LAB — COMPREHENSIVE METABOLIC PANEL
ALT: 22 U/L (ref 0–53)
AST: 18 U/L (ref 0–37)
Albumin: 4.4 g/dL (ref 3.5–5.2)
Alkaline Phosphatase: 44 U/L (ref 39–117)
BILIRUBIN TOTAL: 1.1 mg/dL (ref 0.2–1.2)
BUN: 18 mg/dL (ref 6–23)
CALCIUM: 9.1 mg/dL (ref 8.4–10.5)
CHLORIDE: 105 meq/L (ref 96–112)
CO2: 27 meq/L (ref 19–32)
CREATININE: 1.4 mg/dL (ref 0.40–1.50)
GFR: 54.84 mL/min — ABNORMAL LOW (ref >60.00)
GLUCOSE: 80 mg/dL (ref 70–99)
Potassium: 3.5 mEq/L (ref 3.5–5.1)
Sodium: 143 mEq/L (ref 135–145)
Total Protein: 6.9 g/dL (ref 6.0–8.3)

## 2018-10-12 LAB — CBC
HCT: 41.2 % (ref 39.0–52.0)
Hemoglobin: 14.1 g/dL (ref 13.0–17.0)
MCHC: 34.1 g/dL (ref 30.0–36.0)
MCV: 89.3 fl (ref 78.0–100.0)
PLATELETS: 221 10*3/uL (ref 150.0–400.0)
RBC: 4.62 Mil/uL (ref 4.22–5.81)
RDW: 13 % (ref 11.5–15.5)
WBC: 8 10*3/uL (ref 4.0–10.5)

## 2018-10-12 LAB — LIPID PANEL
CHOL/HDL RATIO: 3
Cholesterol: 102 mg/dL (ref 0–200)
HDL: 31.8 mg/dL — ABNORMAL LOW (ref >39.00)
NONHDL: 70.25
TRIGLYCERIDES: 208 mg/dL — AB (ref 0.0–149.0)
VLDL: 41.6 mg/dL — ABNORMAL HIGH (ref 0.0–40.0)

## 2018-10-12 LAB — LDL CHOLESTEROL, DIRECT: Direct LDL: 60 mg/dL

## 2018-10-12 LAB — HEMOGLOBIN A1C: HEMOGLOBIN A1C: 5.8 % (ref 4.6–6.5)

## 2018-10-16 MED ORDER — ATORVASTATIN CALCIUM 20 MG PO TABS: 20.0000 mg | ORAL_TABLET | Freq: Every day | ORAL | 3 refills | Status: DC

## 2018-10-16 NOTE — Addendum Note (Signed)
Addended by: Magdalene Molly A on: 10/16/2018 08:27 AM   Modules accepted: Orders

## 2018-10-26 DIAGNOSIS — C61 Malignant neoplasm of prostate: Secondary | ICD-10-CM | POA: Diagnosis not present

## 2018-10-26 DIAGNOSIS — Z8546 Personal history of malignant neoplasm of prostate: Secondary | ICD-10-CM | POA: Diagnosis not present

## 2018-10-26 DIAGNOSIS — Z08 Encounter for follow-up examination after completed treatment for malignant neoplasm: Secondary | ICD-10-CM | POA: Diagnosis not present

## 2018-12-14 ENCOUNTER — Other Ambulatory Visit: Payer: Self-pay | Admitting: Family Medicine

## 2019-02-15 DIAGNOSIS — N401 Enlarged prostate with lower urinary tract symptoms: Secondary | ICD-10-CM | POA: Diagnosis not present

## 2019-02-15 DIAGNOSIS — R3912 Poor urinary stream: Secondary | ICD-10-CM | POA: Diagnosis not present

## 2019-02-15 DIAGNOSIS — C61 Malignant neoplasm of prostate: Secondary | ICD-10-CM | POA: Diagnosis not present

## 2019-02-17 ENCOUNTER — Other Ambulatory Visit: Payer: Self-pay | Admitting: Family Medicine

## 2019-03-16 ENCOUNTER — Other Ambulatory Visit: Payer: Self-pay | Admitting: Family Medicine

## 2019-03-22 DIAGNOSIS — S52509A Unspecified fracture of the lower end of unspecified radius, initial encounter for closed fracture: Secondary | ICD-10-CM | POA: Insufficient documentation

## 2019-03-22 DIAGNOSIS — S52502A Unspecified fracture of the lower end of left radius, initial encounter for closed fracture: Secondary | ICD-10-CM | POA: Diagnosis not present

## 2019-03-22 DIAGNOSIS — M25532 Pain in left wrist: Secondary | ICD-10-CM | POA: Diagnosis not present

## 2019-03-22 DIAGNOSIS — S52522A Torus fracture of lower end of left radius, initial encounter for closed fracture: Secondary | ICD-10-CM | POA: Diagnosis not present

## 2019-03-29 DIAGNOSIS — S52522A Torus fracture of lower end of left radius, initial encounter for closed fracture: Secondary | ICD-10-CM | POA: Diagnosis not present

## 2019-04-07 ENCOUNTER — Other Ambulatory Visit: Payer: Self-pay | Admitting: Family Medicine

## 2019-04-07 DIAGNOSIS — R5383 Other fatigue: Secondary | ICD-10-CM

## 2019-04-07 DIAGNOSIS — E663 Overweight: Secondary | ICD-10-CM

## 2019-04-07 DIAGNOSIS — R0683 Snoring: Secondary | ICD-10-CM

## 2019-04-07 DIAGNOSIS — R739 Hyperglycemia, unspecified: Secondary | ICD-10-CM

## 2019-04-07 DIAGNOSIS — N649 Disorder of breast, unspecified: Secondary | ICD-10-CM

## 2019-04-07 DIAGNOSIS — N529 Male erectile dysfunction, unspecified: Secondary | ICD-10-CM

## 2019-04-07 DIAGNOSIS — I10 Essential (primary) hypertension: Secondary | ICD-10-CM

## 2019-04-10 ENCOUNTER — Telehealth: Payer: Self-pay | Admitting: *Deleted

## 2019-04-10 NOTE — Telephone Encounter (Signed)
Called patient to reschedule his Fri, 04/12/19 appointment from 11:00am to 12:00pm; pt understood & agreed. Skype message sent to [facility] to open slot on schedule and move pt into this position d/t provider meeting/SLS 04/15

## 2019-04-12 ENCOUNTER — Ambulatory Visit (INDEPENDENT_AMBULATORY_CARE_PROVIDER_SITE_OTHER): Payer: BLUE CROSS/BLUE SHIELD | Admitting: Family Medicine

## 2019-04-12 ENCOUNTER — Other Ambulatory Visit: Payer: Self-pay

## 2019-04-12 VITALS — BP 167/104 | HR 71 | Wt 249.0 lb

## 2019-04-12 DIAGNOSIS — R519 Headache, unspecified: Secondary | ICD-10-CM

## 2019-04-12 DIAGNOSIS — R51 Headache: Secondary | ICD-10-CM | POA: Diagnosis not present

## 2019-04-12 DIAGNOSIS — R5383 Other fatigue: Secondary | ICD-10-CM | POA: Diagnosis not present

## 2019-04-12 DIAGNOSIS — R0683 Snoring: Secondary | ICD-10-CM

## 2019-04-12 DIAGNOSIS — G479 Sleep disorder, unspecified: Secondary | ICD-10-CM

## 2019-04-12 DIAGNOSIS — R739 Hyperglycemia, unspecified: Secondary | ICD-10-CM

## 2019-04-12 DIAGNOSIS — N649 Disorder of breast, unspecified: Secondary | ICD-10-CM

## 2019-04-12 DIAGNOSIS — I10 Essential (primary) hypertension: Secondary | ICD-10-CM | POA: Diagnosis not present

## 2019-04-12 DIAGNOSIS — E782 Mixed hyperlipidemia: Secondary | ICD-10-CM

## 2019-04-12 DIAGNOSIS — N529 Male erectile dysfunction, unspecified: Secondary | ICD-10-CM

## 2019-04-12 DIAGNOSIS — E663 Overweight: Secondary | ICD-10-CM

## 2019-04-12 MED ORDER — LOSARTAN POTASSIUM 100 MG PO TABS: 100.0000 mg | ORAL_TABLET | Freq: Every day | ORAL | 1 refills | Status: DC

## 2019-04-12 MED ORDER — AMLODIPINE BESYLATE 5 MG PO TABS: 5.0000 mg | ORAL_TABLET | Freq: Two times a day (BID) | ORAL | 1 refills | Status: DC

## 2019-04-12 NOTE — Assessment & Plan Note (Signed)
Tolerating statin, encouraged heart healthy diet, avoid trans fats, minimize simple carbs and saturated fats. Increase exercise as tolerated 

## 2019-04-12 NOTE — Assessment & Plan Note (Signed)
Poorly controlled will alter medications, encouraged DASH diet, minimize caffeine and obtain adequate sleep. Report concerning symptoms and follow up as directed and as needed. Move Losartan to qhs and increase Amlodipine 5 mg po bid and evaluate sleep apnea. They are encouraged to get an Omron BP cuff and report numbers to [place] in 2-3 weeks.

## 2019-04-12 NOTE — Assessment & Plan Note (Signed)
hgba1c acceptable, minimize simple carbs. Increase exercise as tolerated. Check labs in 3 months

## 2019-04-12 NOTE — Assessment & Plan Note (Signed)
Restless sleep, headaches at night, worsening snoring. Uncontrolled Blood Pressure andigh weight gain. Referred to pulmonology for evaluation of sleep apnea

## 2019-04-12 NOTE — Progress Notes (Signed)
Virtual Visit via Video Note  I connected with Joseph Fowler on 04/12/19 at 12:00 PM EDT by a video enabled telemedicine application and verified that I am speaking with the correct person using two identifiers.   I discussed the limitations of evaluation and management by telemedicine and the availability of in person appointments. The patient expressed understanding and agreed to proceed. Magdalene Molly, CMA was able to get the patient set up on a video visit platform    Subjective:    Patient ID: Joseph Fowler, male    DOB: [birth year], 63 y.o.   MRN: [id number]  No chief complaint on file.   HPI Patient is in today for evaluation of elevated blood pressure. He is seen by video visit from his home with his wife accompanying him they note recetn blood pressures in the 160s to 170s over 90s to less than 110 level. He broke his left wrist a couple of days ago and he is now splinting it and it is improving but when he was there it was noted to be a systolic of 536. He admits to heavy snoring,restless sleep, waking up with headaches and this week it was bad enough to make him get up an excedrin migraine which did help.no recent febrile illness or hospitalizations. Denies CP/palp/SOB/congestion/fevers/GI or GU c/o. Taking meds as prescribed  Past Medical History:  Diagnosis Date  . Allergy   . Arthritis 09/06/2013  . Blood in stool 12/03/2012  . BPH (benign prostatic hyperplasia) 09/12/2012  . Chicken pox as a child  . Dermatitis 09/12/2012  . ED (erectile dysfunction) 09/12/2012  . Hyperlipidemia   . Hypertension 63 yrs old  . Nocturia 09/12/2012  . Obesity 09/12/2012  . Overweight(278.02) 09/12/2012  . Preventative health care 09/12/2012  . Snoring 09/12/2012    Past Surgical History:  Procedure Laterality Date  . APPENDECTOMY  63 yrs old  . EYE SURGERY  2005   ,right, injury, decreased visual acuity, photophobia  . HERNIA REPAIR    . PROSTATE BIOPSY    . torn meninscus  2011   knee-  right  . VASECTOMY  63 yrs old    Family History  Problem Relation Age of Onset  . Diabetes Father        type 2  . Other Brother        whole in heart  . Heart disease Brother        sudden cardiac death  . Stroke Maternal Grandmother        X 2  . Arthritis Paternal Uncle   . Colon cancer Neg Hx     Social History   Socioeconomic History  . Marital status: Married    Spouse name: Not on file  . Number of children: Not on file  . Years of education: Not on file  . Highest education level: Not on file  Occupational History  . Not on file  Social Needs  . Financial resource strain: Not on file  . Food insecurity:    Worry: Not on file    Inability: Not on file  . Transportation needs:    Medical: Not on file    Non-medical: Not on file  Tobacco Use  . Smoking status: Former Smoker    Packs/day: 2.00    Years: 8.00    Pack years: 16.00    Types: Cigarettes    Last attempt to quit: 12/26/1981    Years since quitting: 37.3  . Smokeless tobacco: Never Used  Substance and Sexual Activity  . Alcohol use: No  . Drug use: No  . Sexual activity: Yes  Lifestyle  . Physical activity:    Days per week: Not on file    Minutes per session: Not on file  . Stress: Not on file  Relationships  . Social connections:    Talks on phone: Not on file    Gets together: Not on file    Attends religious service: Not on file    Active member of club or organization: Not on file    Attends meetings of clubs or organizations: Not on file    Relationship status: Not on file  . Intimate partner violence:    Fear of current or ex partner: Not on file    Emotionally abused: Not on file    Physically abused: Not on file    Forced sexual activity: Not on file  Other Topics Concern  . Not on file  Social History Narrative  . Not on file    Outpatient Medications Prior to Visit  Medication Sig Dispense Refill  . albuterol (PROVENTIL HFA;VENTOLIN HFA) 108 (90 Base) MCG/ACT inhaler  Inhale 2 puffs into the lungs every 6 (six) hours as needed for wheezing or shortness of breath. 1 Inhaler 2  . atorvastatin (LIPITOR) 20 MG tablet Take 1 tablet by mouth once daily 30 tablet 1  . Cyanocobalamin (VITAMIN B-12 CR PO) Take 1 tablet by mouth daily.    Nyoka Cowden Tea, Camillia sinensis, (GREEN TEA PO) Take 2 capsules by mouth daily.    Marland Kitchen loratadine (CLARITIN) 10 MG tablet Take 1 tablet (10 mg total) by mouth daily. 90 tablet 1  . Miconazole Nitrate 2 % LOTN Apply to lower legs daily after shower for 30 days. 56 g 1  . Multiple Vitamin (MULTIVITAMIN) tablet Take 1 tablet by mouth daily.    . sildenafil (VIAGRA) 100 MG tablet TAKE 1 TABLET BY MOUTH ONCE DAILY AS NEEDED FOR  ERECTILE  DYSFUNCTION 4 tablet 0  . tamsulosin (FLOMAX) 0.4 MG CAPS capsule Take by mouth.    . triamterene-hydrochlorothiazide (MAXZIDE-25) 37.5-25 MG tablet Take 1 tablet by mouth once daily 90 tablet 0  . vitamin C (ASCORBIC ACID) 500 MG tablet Take 500 mg by mouth daily.    Marland Kitchen VITAMIN D, ERGOCALCIFEROL, PO Take 1 capsule by mouth daily.    . Vitamins C E (CRANBERRY CONCENTRATE PO) Take 1 tablet by mouth daily.    Marland Kitchen amLODipine (NORVASC) 5 MG tablet Take 1 tablet by mouth once daily 90 tablet 0  . doxycycline (VIBRA-TABS) 100 MG tablet Take 1 tablet (100 mg total) by mouth 2 (two) times daily. 20 tablet 0  . HYDROcodone-homatropine (HYCODAN) 5-1.5 MG/5ML syrup Take 5 mLs by mouth every 8 (eight) hours as needed for cough. 100 mL 0  . losartan (COZAAR) 100 MG tablet Take 1 tablet (100 mg total) by mouth daily. 91 tablet 1  . predniSONE (DELTASONE) 10 MG tablet 6 TAB PO DAY 1 5 TAB PO DAY 2 4 TAB PO DAY 3 3 TAB PO DAY 4 2 TAB PO DAY 5 1 TAB PO DAY 6 21 tablet 0   No facility-administered medications prior to visit.     No Known Allergies  Review of Systems  Constitutional: Positive for malaise/fatigue. Negative for fever.  HENT: Negative for congestion.   Eyes: Negative for blurred vision.  Respiratory:  Negative for shortness of breath.   Cardiovascular: Negative for chest pain, palpitations and leg  swelling.  Gastrointestinal: Negative for abdominal pain, blood in stool and nausea.  Genitourinary: Negative for dysuria and frequency.  Musculoskeletal: Negative for falls.  Skin: Negative for rash.  Neurological: Positive for headaches. Negative for dizziness and loss of consciousness.  Endo/Heme/Allergies: Negative for environmental allergies.  Psychiatric/Behavioral: Negative for depression. The patient is not nervous/anxious.        Objective:    Physical Exam Constitutional:      Appearance: Normal appearance. He is obese. He is not ill-appearing.  HENT:     Head: Normocephalic and atraumatic.     Nose: Nose normal.  Pulmonary:     Effort: Pulmonary effort is normal.  Skin:    General: Skin is dry.  Neurological:     Mental Status: He is alert and oriented to person, place, and time.  Psychiatric:        Mood and Affect: Mood normal.        Behavior: Behavior normal.     BP (!) 167/104 (BP Location: Left Arm, Patient Position: Sitting, Cuff Size: Normal) Comment: Done at home  Pulse 71   Wt 249 lb (112.9 kg)   BMI 31.97 kg/m  Wt Readings from Last 3 Encounters:  04/12/19 249 lb (112.9 kg)  10/11/18 250 lb 6.4 oz (113.6 kg)  12/11/17 254 lb 9.6 oz (115.5 kg)    Diabetic Foot Exam - Simple   No data filed     Lab Results  Component Value Date   WBC 8.0 10/11/2018   HGB 14.1 10/11/2018   HCT 41.2 10/11/2018   PLT 221.0 10/11/2018   GLUCOSE 80 10/11/2018   CHOL 102 10/11/2018   TRIG 208.0 (H) 10/11/2018   HDL 31.80 (L) 10/11/2018   LDLDIRECT 60.0 10/11/2018   LDLCALC 47 10/12/2017   ALT 22 10/11/2018   AST 18 10/11/2018   NA 143 10/11/2018   K 3.5 10/11/2018   CL 105 10/11/2018   CREATININE 1.40 10/11/2018   BUN 18 10/11/2018   CO2 27 10/11/2018   TSH 1.18 10/11/2018   PSA 7.29 (H) 10/12/2017   HGBA1C 5.8 10/11/2018    Lab Results  Component  Value Date   TSH 1.18 10/11/2018   Lab Results  Component Value Date   WBC 8.0 10/11/2018   HGB 14.1 10/11/2018   HCT 41.2 10/11/2018   MCV 89.3 10/11/2018   PLT 221.0 10/11/2018   Lab Results  Component Value Date   NA 143 10/11/2018   K 3.5 10/11/2018   CO2 27 10/11/2018   GLUCOSE 80 10/11/2018   BUN 18 10/11/2018   CREATININE 1.40 10/11/2018   BILITOT 1.1 10/11/2018   ALKPHOS 44 10/11/2018   AST 18 10/11/2018   ALT 22 10/11/2018   PROT 6.9 10/11/2018   ALBUMIN 4.4 10/11/2018   CALCIUM 9.1 10/11/2018   GFR 54.84 (L) 10/11/2018   Lab Results  Component Value Date   CHOL 102 10/11/2018   Lab Results  Component Value Date   HDL 31.80 (L) 10/11/2018   Lab Results  Component Value Date   LDLCALC 47 10/12/2017   Lab Results  Component Value Date   TRIG 208.0 (H) 10/11/2018   Lab Results  Component Value Date   CHOLHDL 3 10/11/2018   Lab Results  Component Value Date   HGBA1C 5.8 10/11/2018       Assessment & Plan:   Problem List Items Addressed This Visit    Hypertension    Poorly controlled will alter medications, encouraged DASH diet,  minimize caffeine and obtain adequate sleep. Report concerning symptoms and follow up as directed and as needed. Move Losartan to qhs and increase Amlodipine 5 mg po bid and evaluate sleep apnea. They are encouraged to get an Omron BP cuff and report numbers to [place] in 2-3 weeks.      Relevant Medications   losartan (COZAAR) 100 MG tablet   amLODipine (NORVASC) 5 MG tablet   Other Relevant Orders   Ambulatory referral to Pulmonology   ED (erectile dysfunction)   Snoring    Restless sleep, headaches at night, worsening snoring. Uncontrolled Blood Pressure andigh weight gain. Referred to pulmonology for evaluation of sleep apnea      Relevant Orders   Ambulatory referral to Pulmonology   Hyperglycemia    hgba1c acceptable, minimize simple carbs. Increase exercise as tolerated. Check labs in 3 months       Hyperlipidemia, mixed    Tolerating statin, encouraged heart healthy diet, avoid trans fats, minimize simple carbs and saturated fats. Increase exercise as tolerated      Relevant Medications   losartan (COZAAR) 100 MG tablet   amLODipine (NORVASC) 5 MG tablet    Other Visit Diagnoses    Nonintractable episodic headache, unspecified headache type    -  Primary   Relevant Medications   amLODipine (NORVASC) 5 MG tablet   Other Relevant Orders   Ambulatory referral to Pulmonology   Other fatigue       Relevant Orders   Ambulatory referral to Pulmonology   Breast lesion       Overweight       Restless sleeper       Relevant Orders   Ambulatory referral to Pulmonology      I have discontinued Shanon Brow L. Hosterman's HYDROcodone-homatropine, doxycycline, and predniSONE. I have also changed his losartan and amLODipine. Additionally, I am having him maintain his multivitamin, (VITAMIN D, ERGOCALCIFEROL, PO), vitamin C, Cyanocobalamin (VITAMIN B-12 CR PO), Vitamins C E (CRANBERRY CONCENTRATE PO), (Green Tea, Camillia sinensis, (GREEN TEA PO)), Miconazole Nitrate, loratadine, albuterol, tamsulosin, sildenafil, atorvastatin, and triamterene-hydrochlorothiazide.  Meds ordered this encounter  Medications  . losartan (COZAAR) 100 MG tablet    Sig: Take 1 tablet (100 mg total) by mouth at bedtime.    Dispense:  91 tablet    Refill:  1  . amLODipine (NORVASC) 5 MG tablet    Sig: Take 1 tablet (5 mg total) by mouth 2 (two) times daily.    Dispense:  180 tablet    Refill:  1     Penni Homans, MD   I discussed the assessment and treatment plan with the patient. The patient was provided an opportunity to ask questions and all were answered. The patient agreed with the plan and demonstrated an understanding of the instructions.   The patient was advised to call back or seek an in-person evaluation if the symptoms worsen or if the condition fails to improve as anticipated.  I provided  non-face-to-face time during this encounter.   Penni Homans, MD

## 2019-04-19 DIAGNOSIS — S52522A Torus fracture of lower end of left radius, initial encounter for closed fracture: Secondary | ICD-10-CM | POA: Diagnosis not present

## 2019-05-14 ENCOUNTER — Other Ambulatory Visit: Payer: Self-pay | Admitting: Family Medicine

## 2019-06-26 ENCOUNTER — Other Ambulatory Visit: Payer: Self-pay | Admitting: Family Medicine

## 2019-07-08 ENCOUNTER — Other Ambulatory Visit: Payer: Self-pay | Admitting: Family Medicine

## 2019-07-08 DIAGNOSIS — N529 Male erectile dysfunction, unspecified: Secondary | ICD-10-CM

## 2019-07-08 DIAGNOSIS — E663 Overweight: Secondary | ICD-10-CM

## 2019-07-08 DIAGNOSIS — R5383 Other fatigue: Secondary | ICD-10-CM

## 2019-07-08 DIAGNOSIS — N649 Disorder of breast, unspecified: Secondary | ICD-10-CM

## 2019-07-08 DIAGNOSIS — R0683 Snoring: Secondary | ICD-10-CM

## 2019-07-08 DIAGNOSIS — R739 Hyperglycemia, unspecified: Secondary | ICD-10-CM

## 2019-07-08 DIAGNOSIS — I10 Essential (primary) hypertension: Secondary | ICD-10-CM

## 2019-07-25 ENCOUNTER — Other Ambulatory Visit: Payer: Self-pay | Admitting: Family Medicine

## 2019-07-31 ENCOUNTER — Other Ambulatory Visit: Payer: Self-pay | Admitting: Family Medicine

## 2019-08-24 ENCOUNTER — Other Ambulatory Visit: Payer: Self-pay | Admitting: Family Medicine

## 2019-10-04 ENCOUNTER — Other Ambulatory Visit: Payer: Self-pay | Admitting: Family Medicine

## 2019-10-04 DIAGNOSIS — R0683 Snoring: Secondary | ICD-10-CM

## 2019-10-04 DIAGNOSIS — N649 Disorder of breast, unspecified: Secondary | ICD-10-CM

## 2019-10-04 DIAGNOSIS — R739 Hyperglycemia, unspecified: Secondary | ICD-10-CM

## 2019-10-04 DIAGNOSIS — N529 Male erectile dysfunction, unspecified: Secondary | ICD-10-CM

## 2019-10-04 DIAGNOSIS — R5383 Other fatigue: Secondary | ICD-10-CM

## 2019-10-04 DIAGNOSIS — I10 Essential (primary) hypertension: Secondary | ICD-10-CM

## 2019-10-04 DIAGNOSIS — E663 Overweight: Secondary | ICD-10-CM

## 2019-10-21 ENCOUNTER — Other Ambulatory Visit: Payer: Self-pay | Admitting: *Deleted

## 2019-10-21 DIAGNOSIS — I10 Essential (primary) hypertension: Secondary | ICD-10-CM

## 2019-10-21 MED ORDER — LOSARTAN POTASSIUM 100 MG PO TABS: 100.0000 mg | ORAL_TABLET | Freq: Every day | ORAL | 0 refills | Status: DC

## 2019-10-22 ENCOUNTER — Other Ambulatory Visit: Payer: Self-pay | Admitting: Family Medicine

## 2019-11-07 ENCOUNTER — Telehealth: Payer: Self-pay | Admitting: *Deleted

## 2019-11-07 ENCOUNTER — Other Ambulatory Visit: Payer: Self-pay | Admitting: Family Medicine

## 2019-11-07 NOTE — Telephone Encounter (Signed)
Left message on machine that he needs to make an appointment for follow up.

## 2019-11-08 DIAGNOSIS — Z20828 Contact with and (suspected) exposure to other viral communicable diseases: Secondary | ICD-10-CM | POA: Diagnosis not present

## 2019-11-26 ENCOUNTER — Other Ambulatory Visit: Payer: Self-pay | Admitting: Urology

## 2019-11-26 DIAGNOSIS — C61 Malignant neoplasm of prostate: Secondary | ICD-10-CM

## 2019-11-28 DIAGNOSIS — C61 Malignant neoplasm of prostate: Secondary | ICD-10-CM | POA: Diagnosis not present

## 2019-12-02 ENCOUNTER — Other Ambulatory Visit: Payer: Self-pay | Admitting: Family Medicine

## 2019-12-18 ENCOUNTER — Other Ambulatory Visit: Payer: Self-pay

## 2019-12-31 ENCOUNTER — Other Ambulatory Visit: Payer: Self-pay

## 2020-01-07 ENCOUNTER — Other Ambulatory Visit: Payer: Self-pay | Admitting: Family Medicine

## 2020-01-07 DIAGNOSIS — N649 Disorder of breast, unspecified: Secondary | ICD-10-CM

## 2020-01-07 DIAGNOSIS — R739 Hyperglycemia, unspecified: Secondary | ICD-10-CM

## 2020-01-07 DIAGNOSIS — N529 Male erectile dysfunction, unspecified: Secondary | ICD-10-CM

## 2020-01-07 DIAGNOSIS — I10 Essential (primary) hypertension: Secondary | ICD-10-CM

## 2020-01-07 DIAGNOSIS — R0683 Snoring: Secondary | ICD-10-CM

## 2020-01-07 DIAGNOSIS — R5383 Other fatigue: Secondary | ICD-10-CM

## 2020-01-07 DIAGNOSIS — E663 Overweight: Secondary | ICD-10-CM

## 2020-01-17 ENCOUNTER — Other Ambulatory Visit: Payer: Self-pay | Admitting: Family Medicine

## 2020-01-21 ENCOUNTER — Ambulatory Visit
Admission: RE | Admit: 2020-01-21 | Discharge: 2020-01-21 | Disposition: A | Discharge status: Home / Self Care | Payer: Self-pay | Source: Ambulatory Visit | Attending: Urology | Admitting: Urology

## 2020-01-21 ENCOUNTER — Other Ambulatory Visit: Payer: Self-pay

## 2020-01-21 DIAGNOSIS — C61 Malignant neoplasm of prostate: Secondary | ICD-10-CM

## 2020-01-21 MED ORDER — GADOBENATE DIMEGLUMINE 529 MG/ML IV SOLN
20.0000 mL | Freq: Once | INTRAVENOUS | Status: AC | PRN
  Administered 2020-01-21: 12:00:00 20 mL via INTRAVENOUS

## 2020-01-28 ENCOUNTER — Other Ambulatory Visit: Payer: Self-pay | Admitting: Family Medicine

## 2020-01-28 DIAGNOSIS — I10 Essential (primary) hypertension: Secondary | ICD-10-CM

## 2020-02-07 ENCOUNTER — Other Ambulatory Visit: Payer: Self-pay | Admitting: Family Medicine

## 2020-02-11 MED ORDER — ATORVASTATIN CALCIUM 20 MG PO TABS: ORAL_TABLET | ORAL | 0 refills | Status: DC

## 2020-02-11 NOTE — Addendum Note (Signed)
Addended by: Kelle Darting A on: 02/11/2020 09:57 AM   Modules accepted: Orders

## 2020-02-20 ENCOUNTER — Ambulatory Visit: Payer: Self-pay | Admitting: Family Medicine

## 2020-03-12 ENCOUNTER — Other Ambulatory Visit: Payer: Self-pay | Admitting: Family Medicine

## 2020-04-06 ENCOUNTER — Other Ambulatory Visit: Payer: Self-pay | Admitting: Family Medicine

## 2020-04-06 DIAGNOSIS — N649 Disorder of breast, unspecified: Secondary | ICD-10-CM

## 2020-04-06 DIAGNOSIS — R0683 Snoring: Secondary | ICD-10-CM

## 2020-04-06 DIAGNOSIS — E663 Overweight: Secondary | ICD-10-CM

## 2020-04-06 DIAGNOSIS — N529 Male erectile dysfunction, unspecified: Secondary | ICD-10-CM

## 2020-04-06 DIAGNOSIS — R739 Hyperglycemia, unspecified: Secondary | ICD-10-CM

## 2020-04-06 DIAGNOSIS — R5383 Other fatigue: Secondary | ICD-10-CM

## 2020-04-06 DIAGNOSIS — I10 Essential (primary) hypertension: Secondary | ICD-10-CM

## 2020-04-09 ENCOUNTER — Encounter: Payer: Self-pay | Admitting: Family Medicine

## 2020-04-09 ENCOUNTER — Ambulatory Visit: Payer: 59 | Admitting: Family Medicine

## 2020-04-09 ENCOUNTER — Other Ambulatory Visit: Payer: Self-pay

## 2020-04-09 DIAGNOSIS — R0683 Snoring: Secondary | ICD-10-CM | POA: Diagnosis not present

## 2020-04-09 DIAGNOSIS — R5383 Other fatigue: Secondary | ICD-10-CM

## 2020-04-09 DIAGNOSIS — E663 Overweight: Secondary | ICD-10-CM

## 2020-04-09 DIAGNOSIS — E782 Mixed hyperlipidemia: Secondary | ICD-10-CM

## 2020-04-09 DIAGNOSIS — R739 Hyperglycemia, unspecified: Secondary | ICD-10-CM

## 2020-04-09 DIAGNOSIS — N649 Disorder of breast, unspecified: Secondary | ICD-10-CM

## 2020-04-09 DIAGNOSIS — I10 Essential (primary) hypertension: Secondary | ICD-10-CM | POA: Diagnosis not present

## 2020-04-09 DIAGNOSIS — C61 Malignant neoplasm of prostate: Secondary | ICD-10-CM

## 2020-04-09 DIAGNOSIS — N529 Male erectile dysfunction, unspecified: Secondary | ICD-10-CM

## 2020-04-09 MED ORDER — TRIAMTERENE-HCTZ 37.5-25 MG PO TABS: 1.0000 | ORAL_TABLET | Freq: Every day | ORAL | 1 refills | Status: DC

## 2020-04-09 MED ORDER — LOSARTAN POTASSIUM 100 MG PO TABS: 100.0000 mg | ORAL_TABLET | Freq: Every day | ORAL | 1 refills | Status: DC

## 2020-04-09 MED ORDER — ATORVASTATIN CALCIUM 20 MG PO TABS: 20.0000 mg | ORAL_TABLET | Freq: Every day | ORAL | 1 refills | Status: DC

## 2020-04-09 MED ORDER — AMLODIPINE BESYLATE 5 MG PO TABS: 5.0000 mg | ORAL_TABLET | Freq: Two times a day (BID) | ORAL | 1 refills | Status: DC

## 2020-04-09 NOTE — Patient Instructions (Addendum)
Omron Blood Pressure cuff, upper arm, want BP 100-140/60-90 Pulse oximeter, want oxygen in 90s  Weekly vitals  Take Multivitamin with minerals, selenium Vitamin D 1000-2000 IU daily Probiotic with lactobacillus and bifidophilus Asprin EC 81 mg daily Krill or fish oil caps daily Melatonin 2-5 mg at bedtime  [email or web address]   The mRNA technology has been in development for 20 years and we already had the Coronavirus family of viruses (which usually just cause the common cold) genetically mapped already which is why we were able to come up with viable vaccine candidates so quickly in stage 1, then stage 2 scientifically took the correct amount of time what we did to speed it up was just build the manufacturing platform at the same time we were running the experiments so if it worked we could produce faster. And stage 3 has now had many months and millions of people immunized and we are seeing the immunity hold for over 9 months now with sign of it dissipating and no significant numbers of adverse reactions.  During every flu season we see 2 anaphylactic reactions for every million shots given and we initially thought we would see 11 per million with the COVID vaccine but now we see only 2-3 with Moderna and 5 or so with [place] so compared to someone is dying every 20 minutes from [place] and more deadly and infectious strains are coming it is definitely best when weighing the risks and benefits to take the shots.  Another pooled analysis of the 5 most utilized vaccines in the world shows that after full immunization so far no one has died from [place].   Hypertension, Adult High blood pressure (hypertension) is when the force of blood pumping through the arteries is too strong. The arteries are the blood vessels that carry blood from the heart throughout the body. Hypertension forces the heart to work harder to pump blood and may cause arteries to become narrow or  stiff. Untreated or uncontrolled hypertension can cause a heart attack, heart failure, a stroke, kidney disease, and other problems. A blood pressure reading consists of a higher number over a lower number. Ideally, your blood pressure should be below 120/80. The first ("top") number is called the systolic pressure. It is a measure of the pressure in your arteries as your heart beats. The second ("bottom") number is called the diastolic pressure. It is a measure of the pressure in your arteries as the heart relaxes. What are the causes? The exact cause of this condition is not known. There are some conditions that result in or are related to high blood pressure. What increases the risk? Some risk factors for high blood pressure are under your control. The following factors may make you more likely to develop this condition:  Smoking.  Having type 2 diabetes mellitus, high cholesterol, or both.  Not getting enough exercise or physical activity.  Being overweight.  Having too much fat, sugar, calories, or salt (sodium) in your diet.  Drinking too much alcohol. Some risk factors for high blood pressure may be difficult or impossible to change. Some of these factors include:  Having chronic kidney disease.  Having a family history of high blood pressure.  Age. Risk increases with age.  Race. You may be at higher risk if you are African American.  Gender. Men are at higher risk than women before age 9. After age 25, women are at higher risk than men.  Having obstructive sleep apnea.  Stress. What are  the signs or symptoms? High blood pressure may not cause symptoms. Very high blood pressure (hypertensive crisis) may cause:  Headache.  Anxiety.  Shortness of breath.  Nosebleed.  Nausea and vomiting.  Vision changes.  Severe chest pain.  Seizures. How is this diagnosed? This condition is diagnosed by measuring your blood pressure while you are seated, with your arm  resting on a flat surface, your legs uncrossed, and your feet flat on the floor. The cuff of the blood pressure monitor will be placed directly against the skin of your upper arm at the level of your heart. It should be measured at least twice using the same arm. Certain conditions can cause a difference in blood pressure between your right and left arms. Certain factors can cause blood pressure readings to be lower or higher than normal for a short period of time:  When your blood pressure is higher when you are in a health care provider's office than when you are at home, this is called white coat hypertension. Most people with this condition do not need medicines.  When your blood pressure is higher at home than when you are in a health care provider's office, this is called masked hypertension. Most people with this condition may need medicines to control blood pressure. If you have a high blood pressure reading during one visit or you have normal blood pressure with other risk factors, you may be asked to:  Return on a different day to have your blood pressure checked again.  Monitor your blood pressure at home for 1 week or longer. If you are diagnosed with hypertension, you may have other blood or imaging tests to help your health care provider understand your overall risk for other conditions. How is this treated? This condition is treated by making healthy lifestyle changes, such as eating healthy foods, exercising more, and reducing your alcohol intake. Your health care provider may prescribe medicine if lifestyle changes are not enough to get your blood pressure under control, and if:  Your systolic blood pressure is above 130.  Your diastolic blood pressure is above 80. Your personal target blood pressure may vary depending on your medical conditions, your age, and other factors. Follow these instructions at home: Eating and drinking   Eat a diet that is high in fiber and  potassium, and low in sodium, added sugar, and fat. An example eating plan is called the DASH (Dietary Approaches to Stop Hypertension) diet. To eat this way: ? Eat plenty of fresh fruits and vegetables. Try to fill one half of your plate at each meal with fruits and vegetables. ? Eat whole grains, such as whole-wheat pasta, brown rice, or whole-grain bread. Fill about one fourth of your plate with whole grains. ? Eat or drink low-fat dairy products, such as skim milk or low-fat yogurt. ? Avoid fatty cuts of meat, processed or cured meats, and poultry with skin. Fill about one fourth of your plate with lean proteins, such as fish, chicken without skin, beans, eggs, or tofu. ? Avoid pre-made and processed foods. These tend to be higher in sodium, added sugar, and fat.  Reduce your daily sodium intake. Most people with hypertension should eat less than 1,500 mg of sodium a day.  Do not drink alcohol if: ? Your health care provider tells you not to drink. ? You are pregnant, may be pregnant, or are planning to become pregnant.  If you drink alcohol: ? Limit how much you use to:  0-1  drink a day for women.  0-2 drinks a day for men. ? Be aware of how much alcohol is in your drink. In the [place] one drink equals one 12 oz bottle of beer (355 mL), one 5 oz glass of wine (148 mL), or one 1 oz glass of hard liquor (44 mL). Lifestyle   Work with your health care provider to maintain a healthy body weight or to lose weight. Ask what an ideal weight is for you.  Get at least 30 minutes of exercise most days of the week. Activities may include walking, swimming, or biking.  Include exercise to strengthen your muscles (resistance exercise), such as Pilates or lifting weights, as part of your weekly exercise routine. Try to do these types of exercises for 30 minutes at least 3 days a week.  Do not use any products that contain nicotine or tobacco, such as cigarettes, e-cigarettes, and chewing tobacco.  If you need help quitting, ask your health care provider.  Monitor your blood pressure at home as told by your health care provider.  Keep all follow-up visits as told by your health care provider. This is important. Medicines  Take over-the-counter and prescription medicines only as told by your health care provider. Follow directions carefully. Blood pressure medicines must be taken as prescribed.  Do not skip doses of blood pressure medicine. Doing this puts you at risk for problems and can make the medicine less effective.  Ask your health care provider about side effects or reactions to medicines that you should watch for. Contact a health care provider if you:  Think you are having a reaction to a medicine you are taking.  Have headaches that keep coming back (recurring).  Feel dizzy.  Have swelling in your ankles.  Have trouble with your vision. Get help right away if you:  Develop a severe headache or confusion.  Have unusual weakness or numbness.  Feel faint.  Have severe pain in your chest or abdomen.  Vomit repeatedly.  Have trouble breathing. Summary  Hypertension is when the force of blood pumping through your arteries is too strong. If this condition is not controlled, it may put you at risk for serious complications.  Your personal target blood pressure may vary depending on your medical conditions, your age, and other factors. For most people, a normal blood pressure is less than 120/80.  Hypertension is treated with lifestyle changes, medicines, or a combination of both. Lifestyle changes include losing weight, eating a healthy, low-sodium diet, exercising more, and limiting alcohol. This information is not intended to replace advice given to you by your health care provider. Make sure you discuss any questions you have with your health care provider. Document Revised: 08/22/2018 Document Reviewed: 08/22/2018 Elsevier Patient Education  2020 [employer].

## 2020-04-09 NOTE — Assessment & Plan Note (Signed)
hgba1c acceptable, minimize simple carbs. Increase exercise as tolerated.  

## 2020-04-09 NOTE — Assessment & Plan Note (Signed)
Not well controlled, no changes to meds. Encouraged heart healthy diet such as the DASH diet and exercise as tolerated. Was not taking the Amlodipine bid so will increase to bid

## 2020-04-09 NOTE — Assessment & Plan Note (Signed)
Follows with Dr Alinda Money but is overdue for follow up.

## 2020-04-09 NOTE — Assessment & Plan Note (Signed)
Tolerating statin, encouraged heart healthy diet, avoid trans fats, minimize simple carbs and saturated fats. Increase exercise as tolerated 

## 2020-04-09 NOTE — Progress Notes (Signed)
Subjective:    Patient ID: Joseph Fowler, male    DOB: March 02, 1956, 64 y.o.   MRN: [id number]  Chief Complaint  Patient presents with  . Hypertension    HPI Patient is in today for follow up on chronic medical concerns. No recent febrile illness or hospitalizations. He has continued to work throughout the pandemic and feels well. He tries to stay active and maintain a heart healthy diet. Denies CP/palp/SOB/HA/congestion/fevers/GI or GU c/o. Taking meds as prescribed  Past Medical History:  Diagnosis Date  . Allergy   . Arthritis 09/06/2013  . Blood in stool 12/03/2012  . BPH (benign prostatic hyperplasia) 09/12/2012  . Chicken pox as a child  . Dermatitis 09/12/2012  . ED (erectile dysfunction) 09/12/2012  . Hyperlipidemia   . Hypertension 64 yrs old  . Nocturia 09/12/2012  . Obesity 09/12/2012  . Overweight(278.02) 09/12/2012  . Preventative health care 09/12/2012  . Snoring 09/12/2012    Past Surgical History:  Procedure Laterality Date  . APPENDECTOMY  64 yrs old  . EYE SURGERY  2005   ,right, injury, decreased visual acuity, photophobia  . HERNIA REPAIR    . PROSTATE BIOPSY    . torn meninscus  2011   knee- right  . VASECTOMY  64 yrs old    Family History  Problem Relation Age of Onset  . Diabetes Father        type 2  . Other Brother        whole in heart  . Heart disease Brother        sudden cardiac death  . Stroke Maternal Grandmother        X 2  . Arthritis Paternal Uncle   . Colon cancer Neg Hx     Social History   Socioeconomic History  . Marital status: Married    Spouse name: Not on file  . Number of children: Not on file  . Years of education: Not on file  . Highest education level: Not on file  Occupational History  . Not on file  Tobacco Use  . Smoking status: Former Smoker    Packs/day: 2.00    Years: 8.00    Pack years: 16.00    Types: Cigarettes    Quit date: 12/26/1981    Years since quitting: 38.3  . Smokeless tobacco: Never Used    Substance and Sexual Activity  . Alcohol use: No  . Drug use: No  . Sexual activity: Yes  Other Topics Concern  . Not on file  Social History Narrative  . Not on file   Social Determinants of Health   Financial Resource Strain:   . Difficulty of Paying Living Expenses:   Food Insecurity:   . Worried About Charity fundraiser in the Last Year:   . Arboriculturist in the Last Year:   Transportation Needs:   . Film/video editor (Medical):   Marland Kitchen Lack of Transportation (Non-Medical):   Physical Activity:   . Days of Exercise per Week:   . Minutes of Exercise per Session:   Stress:   . Feeling of Stress :   Social Connections:   . Frequency of Communication with Friends and Family:   . Frequency of Social Gatherings with Friends and Family:   . Attends Religious Services:   . Active Member of Clubs or Organizations:   . Attends Archivist Meetings:   Marland Kitchen Marital Status:   Intimate Partner Violence:   .  Fear of Current or Ex-Partner:   . Emotionally Abused:   Marland Kitchen Physically Abused:   . Sexually Abused:     Outpatient Medications Prior to Visit  Medication Sig Dispense Refill  . albuterol (PROVENTIL HFA;VENTOLIN HFA) 108 (90 Base) MCG/ACT inhaler Inhale 2 puffs into the lungs every 6 (six) hours as needed for wheezing or shortness of breath. 1 Inhaler 2  . Cyanocobalamin (VITAMIN B-12 CR PO) Take 1 tablet by mouth daily.    Nyoka Cowden Tea, Camillia sinensis, (GREEN TEA PO) Take 2 capsules by mouth daily.    Marland Kitchen loratadine (CLARITIN) 10 MG tablet Take 1 tablet (10 mg total) by mouth daily. 90 tablet 1  . Miconazole Nitrate 2 % LOTN Apply to lower legs daily after shower for 30 days. 56 g 1  . Multiple Vitamin (MULTIVITAMIN) tablet Take 1 tablet by mouth daily.    . sildenafil (VIAGRA) 100 MG tablet TAKE 1 TABLET BY MOUTH ONCE DAILY AS NEEDED FOR ERECTILE DYSFUNCTION 4 tablet 0  . vitamin C (ASCORBIC ACID) 500 MG tablet Take 500 mg by mouth daily.    Marland Kitchen VITAMIN D,  ERGOCALCIFEROL, PO Take 1 capsule by mouth daily.    . Vitamins C E (CRANBERRY CONCENTRATE PO) Take 1 tablet by mouth daily.    Marland Kitchen amLODipine (NORVASC) 5 MG tablet Take 1 tablet by mouth twice daily 180 tablet 0  . atorvastatin (LIPITOR) 20 MG tablet TAKE 1 TABLET BY MOUTH ONCE DAILY . APPOINTMENT REQUIRED FOR FUTURE REFILLS 30 tablet 0  . losartan (COZAAR) 100 MG tablet TAKE 1 TABLET BY MOUTH AT BEDTIME 91 tablet 0  . triamterene-hydrochlorothiazide (MAXZIDE-25) 37.5-25 MG tablet Take 1 tablet by mouth once daily 90 tablet 0   No facility-administered medications prior to visit.    No Known Allergies  Review of Systems  Constitutional: Negative for fever and malaise/fatigue.  HENT: Negative for congestion.   Eyes: Negative for blurred vision.  Respiratory: Negative for shortness of breath.   Cardiovascular: Negative for chest pain, palpitations and leg swelling.  Gastrointestinal: Negative for abdominal pain, blood in stool and nausea.  Genitourinary: Negative for dysuria and frequency.  Musculoskeletal: Negative for falls.  Skin: Negative for rash.  Neurological: Negative for dizziness, loss of consciousness and headaches.  Endo/Heme/Allergies: Negative for environmental allergies.  Psychiatric/Behavioral: Negative for depression. The patient is not nervous/anxious.        Objective:    Physical Exam Vitals and nursing note reviewed.  Constitutional:      General: He is not in acute distress.    Appearance: He is well-developed.  HENT:     Head: Normocephalic and atraumatic.     Nose: Nose normal.  Eyes:     General:        Right eye: No discharge.        Left eye: No discharge.  Cardiovascular:     Rate and Rhythm: Normal rate and regular rhythm.     Heart sounds: No murmur.  Pulmonary:     Effort: Pulmonary effort is normal.     Breath sounds: Normal breath sounds.  Abdominal:     General: Bowel sounds are normal.     Palpations: Abdomen is soft.     Tenderness:  There is no abdominal tenderness.  Musculoskeletal:     Cervical back: Normal range of motion and neck supple.  Skin:    General: Skin is warm and dry.  Neurological:     Mental Status: He is alert and oriented  to person, place, and time.     BP (!) 142/86 (BP Location: Right Arm, Cuff Size: Large)   Pulse 75   Temp 98.3 F (36.8 C) (Temporal)   Resp 12   Ht 6\' 2"  (1.88 m)   Wt 246 lb 3.2 oz (111.7 kg)   SpO2 98%   BMI 31.61 kg/m  Wt Readings from Last 3 Encounters:  04/09/20 246 lb 3.2 oz (111.7 kg)  04/12/19 249 lb (112.9 kg)  10/11/18 250 lb 6.4 oz (113.6 kg)    Diabetic Foot Exam - Simple   No data filed     Lab Results  Component Value Date   WBC 8.0 10/11/2018   HGB 14.1 10/11/2018   HCT 41.2 10/11/2018   PLT 221.0 10/11/2018   GLUCOSE 80 10/11/2018   CHOL 102 10/11/2018   TRIG 208.0 (H) 10/11/2018   HDL 31.80 (L) 10/11/2018   LDLDIRECT 60.0 10/11/2018   LDLCALC 47 10/12/2017   ALT 22 10/11/2018   AST 18 10/11/2018   NA 143 10/11/2018   K 3.5 10/11/2018   CL 105 10/11/2018   CREATININE 1.40 10/11/2018   BUN 18 10/11/2018   CO2 27 10/11/2018   TSH 1.18 10/11/2018   PSA 7.29 (H) 10/12/2017   HGBA1C 5.8 10/11/2018    Lab Results  Component Value Date   TSH 1.18 10/11/2018   Lab Results  Component Value Date   WBC 8.0 10/11/2018   HGB 14.1 10/11/2018   HCT 41.2 10/11/2018   MCV 89.3 10/11/2018   PLT 221.0 10/11/2018   Lab Results  Component Value Date   NA 143 10/11/2018   K 3.5 10/11/2018   CO2 27 10/11/2018   GLUCOSE 80 10/11/2018   BUN 18 10/11/2018   CREATININE 1.40 10/11/2018   BILITOT 1.1 10/11/2018   ALKPHOS 44 10/11/2018   AST 18 10/11/2018   ALT 22 10/11/2018   PROT 6.9 10/11/2018   ALBUMIN 4.4 10/11/2018   CALCIUM 9.1 10/11/2018   GFR 54.84 (L) 10/11/2018   Lab Results  Component Value Date   CHOL 102 10/11/2018   Lab Results  Component Value Date   HDL 31.80 (L) 10/11/2018   Lab Results  Component Value Date    LDLCALC 47 10/12/2017   Lab Results  Component Value Date   TRIG 208.0 (H) 10/11/2018   Lab Results  Component Value Date   CHOLHDL 3 10/11/2018   Lab Results  Component Value Date   HGBA1C 5.8 10/11/2018       Assessment & Plan:   Problem List Items Addressed This Visit    Hypertension    Not well controlled, no changes to meds. Encouraged heart healthy diet such as the DASH diet and exercise as tolerated. Was not taking the Amlodipine bid so will increase to bid      Relevant Medications   losartan (COZAAR) 100 MG tablet   triamterene-hydrochlorothiazide (MAXZIDE-25) 37.5-25 MG tablet   amLODipine (NORVASC) 5 MG tablet   atorvastatin (LIPITOR) 20 MG tablet   ED (erectile dysfunction)   Relevant Medications   triamterene-hydrochlorothiazide (MAXZIDE-25) 37.5-25 MG tablet   Snoring   Relevant Medications   triamterene-hydrochlorothiazide (MAXZIDE-25) 37.5-25 MG tablet   Hyperglycemia    hgba1c acceptable, minimize simple carbs. Increase exercise as tolerated.       Relevant Medications   triamterene-hydrochlorothiazide (MAXZIDE-25) 37.5-25 MG tablet   Prostate cancer ([place])    Follows with Dr Alinda Money but is overdue for follow up.  Hyperlipidemia, mixed    Tolerating statin, encouraged heart healthy diet, avoid trans fats, minimize simple carbs and saturated fats. Increase exercise as tolerated      Relevant Medications   losartan (COZAAR) 100 MG tablet   triamterene-hydrochlorothiazide (MAXZIDE-25) 37.5-25 MG tablet   amLODipine (NORVASC) 5 MG tablet   atorvastatin (LIPITOR) 20 MG tablet    Other Visit Diagnoses    Other fatigue       Relevant Medications   triamterene-hydrochlorothiazide (MAXZIDE-25) 37.5-25 MG tablet   Breast lesion       Relevant Medications   triamterene-hydrochlorothiazide (MAXZIDE-25) 37.5-25 MG tablet   Overweight       Relevant Medications   triamterene-hydrochlorothiazide (MAXZIDE-25) 37.5-25 MG tablet      I have changed  Jermiah L. Commins's losartan, triamterene-hydrochlorothiazide, amLODipine, and atorvastatin. I am also having him maintain his multivitamin, (VITAMIN D, ERGOCALCIFEROL, PO), vitamin C, Cyanocobalamin (VITAMIN B-12 CR PO), Vitamins C E (CRANBERRY CONCENTRATE PO), (Green Tea, Camillia sinensis, (GREEN TEA PO)), Miconazole Nitrate, loratadine, albuterol, and sildenafil.  Meds ordered this encounter  Medications  . losartan (COZAAR) 100 MG tablet    Sig: Take 1 tablet (100 mg total) by mouth at bedtime.    Dispense:  90 tablet    Refill:  1  . triamterene-hydrochlorothiazide (MAXZIDE-25) 37.5-25 MG tablet    Sig: Take 1 tablet by mouth daily.    Dispense:  90 tablet    Refill:  1  . amLODipine (NORVASC) 5 MG tablet    Sig: Take 1 tablet (5 mg total) by mouth 2 (two) times daily.    Dispense:  180 tablet    Refill:  1  . atorvastatin (LIPITOR) 20 MG tablet    Sig: Take 1 tablet (20 mg total) by mouth at bedtime.    Dispense:  90 tablet    Refill:  1     Penni Homans, MD

## 2020-04-23 ENCOUNTER — Other Ambulatory Visit: Payer: Self-pay | Admitting: Family Medicine

## 2020-04-27 DIAGNOSIS — M79671 Pain in right foot: Secondary | ICD-10-CM | POA: Insufficient documentation

## 2020-06-09 ENCOUNTER — Telehealth: Payer: 59 | Admitting: Family Medicine

## 2020-06-09 ENCOUNTER — Other Ambulatory Visit: Payer: Self-pay

## 2020-06-18 LAB — PSA: PSA: 6.36

## 2020-06-30 ENCOUNTER — Other Ambulatory Visit: Payer: Self-pay

## 2020-06-30 ENCOUNTER — Other Ambulatory Visit (INDEPENDENT_AMBULATORY_CARE_PROVIDER_SITE_OTHER): Payer: 59

## 2020-06-30 ENCOUNTER — Encounter: Payer: Self-pay | Admitting: Medical Oncology

## 2020-06-30 DIAGNOSIS — I10 Essential (primary) hypertension: Secondary | ICD-10-CM

## 2020-06-30 DIAGNOSIS — R739 Hyperglycemia, unspecified: Secondary | ICD-10-CM

## 2020-06-30 DIAGNOSIS — E782 Mixed hyperlipidemia: Secondary | ICD-10-CM

## 2020-06-30 LAB — COMPREHENSIVE METABOLIC PANEL
ALT: 25 U/L (ref 0–53)
AST: 18 U/L (ref 0–37)
Albumin: 4.7 g/dL (ref 3.5–5.2)
Alkaline Phosphatase: 51 U/L (ref 39–117)
BUN: 18 mg/dL (ref 6–23)
CO2: 28 mEq/L (ref 19–32)
Calcium: 9.6 mg/dL (ref 8.4–10.5)
Chloride: 100 mEq/L (ref 96–112)
Creatinine, Ser: 0.97 mg/dL (ref 0.40–1.50)
GFR: 78.36 mL/min (ref >60.00)
Glucose, Bld: 154 mg/dL — ABNORMAL HIGH (ref 70–99)
Potassium: 3.5 mEq/L (ref 3.5–5.1)
Sodium: 139 mEq/L (ref 135–145)
Total Bilirubin: 1.3 mg/dL — ABNORMAL HIGH (ref 0.2–1.2)
Total Protein: 7.2 g/dL (ref 6.0–8.3)

## 2020-06-30 LAB — CBC
HCT: 43 % (ref 39.0–52.0)
Hemoglobin: 14.7 g/dL (ref 13.0–17.0)
MCHC: 34.1 g/dL (ref 30.0–36.0)
MCV: 88.6 fl (ref 78.0–100.0)
Platelets: 245 10*3/uL (ref 150.0–400.0)
RBC: 4.85 Mil/uL (ref 4.22–5.81)
RDW: 13.5 % (ref 11.5–15.5)
WBC: 8.9 10*3/uL (ref 4.0–10.5)

## 2020-06-30 LAB — LIPID PANEL
Cholesterol: 118 mg/dL (ref 0–200)
HDL: 44.7 mg/dL (ref >39.00)
LDL Cholesterol: 45 mg/dL (ref 0–99)
NonHDL: 73.01
Total CHOL/HDL Ratio: 3
Triglycerides: 139 mg/dL (ref 0.0–149.0)
VLDL: 27.8 mg/dL (ref 0.0–40.0)

## 2020-06-30 LAB — HEMOGLOBIN A1C: Hgb A1c MFr Bld: 6.1 % (ref 4.6–6.5)

## 2020-06-30 LAB — TSH: TSH: 1.71 u[IU]/mL (ref 0.35–4.50)

## 2020-06-30 NOTE — Progress Notes (Signed)
Left message to introduce myself as the prostate nurse navigator and to confirm referral to [facility] 7/9. I asked him to return my call or I will call him back with more information. Due to late referral, I will not be able to mail packet of information but did let him know I will have medical forms available at visit. I provided him with my contact information.

## 2020-06-30 NOTE — Addendum Note (Signed)
Addended by: Caffie Pinto on: 06/30/2020 09:19 AM   Modules accepted: Orders

## 2020-07-01 ENCOUNTER — Encounter: Payer: Self-pay | Admitting: Radiation Oncology

## 2020-07-01 NOTE — Progress Notes (Signed)
GU Location of Tumor / Histology: prostatic adenocarcinoma  If Prostate Cancer, Gleason Score is (3 + 4) and PSA is (6.34). Prostate volume: 75.5 grams.  [name] had a prostate biopsy in 2011 that revealed two atypical cores. Ultimately, patient wasn't diagnosed with prostate cancer until August 2019 and has been under active surveillance since.   Biopsies of prostate (if applicable) revealed:    Past/Anticipated interventions by urology, if any: prostate biopsy, active surveillance, referral to [facility]  Past/Anticipated interventions by medical oncology, if any: no  Weight changes, if any: no  Bowel/Bladder complaints, if any:    Nausea/Vomiting, if any: no  Pain issues, if any:    SAFETY ISSUES:  Prior radiation?   Pacemaker/ICD?   Possible current pregnancy? no, male patient  Is the patient on methotrexate?   Current Complaints / other details:  64 year old male. Resides in [place], [place]. Married.

## 2020-07-02 ENCOUNTER — Encounter: Payer: Self-pay | Admitting: Medical Oncology

## 2020-07-02 NOTE — Progress Notes (Signed)
Left reminder message for [facility] appt 7/9, arriving at 8 am. I reviewed [place] parking, registration and COVID protocol.

## 2020-07-02 NOTE — Progress Notes (Signed)
Radiation Oncology         [phone] ________________________________  Multidisciplinary Prostate Cancer Clinic  Initial Radiation Oncology Consultation  Name: Joseph Fowler MRN: [id number]  Date: 07/03/2020  DOB: Jan 24, 1956  VO:[id number], Bonnita Levan, MD  Raynelle Bring, MD   REFERRING PHYSICIAN: Raynelle Bring, MD  DIAGNOSIS: 64 y.o. gentleman with stage T1c adenocarcinoma of the prostate with a Gleason's score of 3+4 and a PSA of 6.36    ICD-10-CM   1. Malignant neoplasm of prostate (Joseph Fowler)  Joseph Fowler is a 64 y.o. gentleman.  He has a history of elevated PSA dating back to at least 11/2009 with a previous prostate biopsy in 01/2010 showing only atypia. Since that time, he has continued in close observation with Dr. Tommi Rumps at [facility].  His PSA increased to 7.29 in 09/2017, prompting further evaluation with prostate MRI which was performed on 01/02/2018 showing two PI-RADS 4 lesions, one of which was felt to have some possible extension into the seminal vesicle, and a PI-RADS 3 lesion. He proceeded to transrectal ultrasound biopsy with Dr. Tommi Rumps in [place] on 07/31/2018 with 4 of 9 cores positive for Gleason 3+3 prostate cancer. He elected to proceed in active surveillance after meeting with Radiation oncology and urology at both [facility] and [facility].   His PSA remained stable, elevated at 6.34 in 11/2019 and he established care with Dr. Alinda Money in 01/2019, electing to continue in active surveillance despite concerning findings on the previous prostate MRI.  repeat prostate MRI performed on 01/21/2020 demonstrated a PI-RADS 4 lesion in the right base peripheral zone;  And a separate PI-RADS 3 lesion in the right apical to mid prostate. There was no evidence of ECE, SVI or LAN.  The patient proceeded to MR fusion biopsy of the prostate on 06/18/2020. A repeat PSA performed that day was stable at 6.36.  The prostate volume measured 75.5 cc.  Out of 22 core  biopsies, 14 were positive.  The maximum Gleason score was 3+4, and this was seen in 5 out of 8 MRI ROI samples, as well as the right apex, right base lateral (with PNI), and right apex lateral cores on standard biopsy. Additionally, Gleason 3+3 was seen in the right mid lateral (small focus), right mid, left apex, left apex lateral (small focus), left base lateral (small focus), and one ROI MRI sample.  The patient reviewed the biopsy results with his urologist and he has kindly been referred today to the multidisciplinary prostate cancer clinic for presentation of pathology and radiology studies in our conference for discussion of potential radiation treatment options and clinical evaluation.   PREVIOUS RADIATION THERAPY: No  PAST MEDICAL HISTORY:  has a past medical history of Allergy, Arthritis (09/06/2013), Blood in stool (12/03/2012), BPH (benign prostatic hyperplasia) (09/12/2012), Chicken pox (as a child), Dermatitis (09/12/2012), ED (erectile dysfunction) (09/12/2012), Hyperlipidemia, Hypertension (64 yrs old), Nocturia (09/12/2012), Obesity (09/12/2012), Overweight(278.02) (09/12/2012), Preventative health care (09/12/2012), Prostate cancer ([place]), and Snoring (09/12/2012).    PAST SURGICAL HISTORY: Past Surgical History:  Procedure Laterality Date  . APPENDECTOMY  64 yrs old  . EYE SURGERY  2005   ,right, injury, decreased visual acuity, photophobia  . HERNIA REPAIR    . PROSTATE BIOPSY    . torn meninscus  2011   knee- right  . VASECTOMY  64 yrs old    FAMILY HISTORY: family history includes Arthritis in his paternal uncle; Diabetes in his father; Heart disease in his  brother; Other in his brother; Stroke in his maternal grandmother.  SOCIAL HISTORY:  reports that he quit smoking about 38 years ago. His smoking use included cigarettes. He has a 16.00 pack-year smoking history. He has never used smokeless tobacco. He reports that he does not drink alcohol and does not use drugs.  ALLERGIES:  Patient has no known allergies.  MEDICATIONS:  Current Outpatient Medications  Medication Sig Dispense Refill  . albuterol (PROVENTIL HFA;VENTOLIN HFA) 108 (90 Base) MCG/ACT inhaler Inhale 2 puffs into the lungs every 6 (six) hours as needed for wheezing or shortness of breath. 1 Inhaler 2  . amLODipine (NORVASC) 5 MG tablet Take 1 tablet (5 mg total) by mouth 2 (two) times daily. 180 tablet 1  . ascorbic acid (VITAMIN C) 500 MG tablet Take by mouth.    Marland Kitchen atorvastatin (LIPITOR) 20 MG tablet TAKE 1 TABLET BY MOUTH ONCE DAILY APPOINTMENT  REQUIRED  FOR  FUTURE  REFILLS 30 tablet 0  . Cyanocobalamin (VITAMIN B-12 CR PO) Take 1 tablet by mouth daily.    Nyoka Cowden Tea, Camillia sinensis, (GREEN TEA PO) Take 2 capsules by mouth daily.    Marland Kitchen loratadine (CLARITIN) 10 MG tablet Take 1 tablet (10 mg total) by mouth daily. 90 tablet 1  . losartan (COZAAR) 100 MG tablet Take 1 tablet (100 mg total) by mouth at bedtime. 90 tablet 1  . Miconazole Nitrate 2 % LOTN Apply to lower legs daily after shower for 30 days. 56 g 1  . Multiple Vitamin (MULTIVITAMIN) tablet Take 1 tablet by mouth daily.    . sildenafil (VIAGRA) 100 MG tablet TAKE 1 TABLET BY MOUTH ONCE DAILY AS NEEDED FOR ERECTILE DYSFUNCTION 4 tablet 0  . tamsulosin (FLOMAX) 0.4 MG CAPS capsule Take 0.4 mg by mouth at bedtime.    . triamterene-hydrochlorothiazide (MAXZIDE-25) 37.5-25 MG tablet Take 1 tablet by mouth daily. 90 tablet 1  . VITAMIN D, ERGOCALCIFEROL, PO Take by mouth.    . Vitamins C E (CRANBERRY CONCENTRATE PO) Take 1 tablet by mouth daily.     No current facility-administered medications for this encounter.    REVIEW OF SYSTEMS:  On review of systems, the patient reports that he is doing well overall. He denies any chest pain, shortness of breath, cough, fevers, chills, night sweats, unintended weight changes. He denies any bowel disturbances, and denies abdominal pain, nausea or vomiting. He denies any new musculoskeletal or joint aches  or pains. His IPSS was 18, indicating moderate urinary symptoms despite taking Flomax daily. His SHIM was 23, indicating he has well controlled erectile dysfunction, using PDE-5 inhibitors prn. A complete review of systems is obtained and is otherwise negative.   PHYSICAL EXAM:  Wt Readings from Last 3 Encounters:  07/03/20 242 lb 9.6 oz (110 kg)  04/09/20 246 lb 3.2 oz (111.7 kg)  04/12/19 249 lb (112.9 kg)   Temp Readings from Last 3 Encounters:  07/03/20 98 F (36.7 C)  04/09/20 98.3 F (36.8 C) (Temporal)  10/11/18 97.9 F (36.6 C) (Oral)   BP Readings from Last 3 Encounters:  07/03/20 130/76  04/09/20 (!) 142/86  04/12/19 (!) 167/104   Pulse Readings from Last 3 Encounters:  07/03/20 82  04/09/20 75  04/12/19 71    /10  In general this is a well appearing caucasian male in no acute distress. He is alert and oriented x4 and appropriate throughout the examination. HEENT reveals that the patient is normocephalic, atraumatic. EOMs are intact. PERRLA. Skin is  intact without any evidence of gross lesions. Cardiopulmonary assessment is negative for acute distress and he exhibits normal effort. Lower extremities are negative for pretibial pitting edema, deep calf tenderness, cyanosis or clubbing.  KPS = 100  100 - Normal; no complaints; no evidence of disease. 90   - Able to carry on normal activity; minor signs or symptoms of disease. 80   - Normal activity with effort; some signs or symptoms of disease. 47   - Cares for self; unable to carry on normal activity or to do active work. 60   - Requires occasional assistance, but is able to care for most of his personal needs. 50   - Requires considerable assistance and frequent medical care. 70   - Disabled; requires special care and assistance. 79   - Severely disabled; hospital admission is indicated although death not imminent. 79   - Very sick; hospital admission necessary; active supportive treatment necessary. 10   -  Moribund; fatal processes progressing rapidly. 0     - Dead  Karnofsky DA, Abelmann [place], Craver LS and Burchenal [facility] [phone]) The use of the nitrogen mustards in the palliative treatment of carcinoma: with particular reference to bronchogenic carcinoma Cancer 1 634-56   LABORATORY DATA:  Lab Results  Component Value Date   WBC 8.9 06/30/2020   HGB 14.7 06/30/2020   HCT 43.0 06/30/2020   MCV 88.6 06/30/2020   PLT 245.0 06/30/2020   Lab Results  Component Value Date   NA 139 06/30/2020   K 3.5 06/30/2020   CL 100 06/30/2020   CO2 28 06/30/2020   Lab Results  Component Value Date   ALT 25 06/30/2020   AST 18 06/30/2020   ALKPHOS 51 06/30/2020   BILITOT 1.3 (H) 06/30/2020     RADIOGRAPHY: No results found.    IMPRESSION/PLAN: 64 y.o. gentleman with Stage T1c adenocarcinoma of the prostate with a PSA of 6.36 and a Gleason score of 3+4.    We discussed the patient's workup and outlined the nature of prostate cancer in this setting. The patient's T stage, Gleason's score, and PSA put him into the favorable intermediate risk group. Accordingly, he is eligible for a variety of potential treatment options including brachytherapy, 5.5 weeks of external radiation, or prostatectomy We discussed the available radiation techniques, and focused on the details and logistics of delivery. The patient is not an ideal candidate for brachytherapy with a prostate volume of 75.5 cc and IPSS score of 18 despite medical management with Flomax. We discussed that, based on his prostate volume and urinary symptoms, the consensus agreement amongst the group was that he would be best served with prostatectomy for management of his prostate cancer and secondary benefit of improved LUTS associated with BPH. We did discuss and outline the risks, benefits, short and long-term effects associated with radiotherapy and compared and contrasted these with prostatectomy. We discussed the role of SpaceOAR in reducing the rectal  toxicity associated with radiotherapy.   The patient focused most of his questions and interest in robotic-assisted laparoscopic radical prostatectomy. We discussed some of the potential advantages of surgery including surgical staging, the availability of salvage radiotherapy to the prostatic fossa, and the confidence associated with immediate biochemical response.  We discussed some of the potential proven indications for postoperative radiotherapy including positive margins, extracapsular extension, and seminal vesicle involvement. We also talked about some of the other potential findings leading to a recommendation for radiotherapy including a non-zero postoperative PSA and positive lymph nodes.  He was encouraged to ask questions that were answered to his stated satisfaction.  At the end of the conversation the patient is interested in moving forward with prostatectomy. We will share this discussion with Dr. Alinda Money so that they can move forward with scheduling surgery accordingly. We enjoyed meeting with him today, and look forward to following his progress.     Nicholos Johns, PA-C    Tyler Pita, MD  [facility] Direct Dial: [phone]  Fax: [phone] [email or web address]  Skype  [email or web address]   This document serves as a record of services personally performed by Tyler Pita, MD and Freeman Caldron, PA-C. It was created on their behalf by Wilburn Mylar, a trained medical scribe. The creation of this record is based on the scribe's personal observations and the provider's statements to them. This document has been checked and approved by the attending provider.

## 2020-07-03 ENCOUNTER — Encounter: Payer: Self-pay | Admitting: General Practice

## 2020-07-03 ENCOUNTER — Other Ambulatory Visit: Payer: Self-pay

## 2020-07-03 ENCOUNTER — Encounter: Payer: Self-pay | Admitting: Radiation Oncology

## 2020-07-03 ENCOUNTER — Ambulatory Visit
Admission: RE | Admit: 2020-07-03 | Discharge: 2020-07-03 | Disposition: A | Discharge status: Home / Self Care | Payer: 59 | Source: Ambulatory Visit | Attending: Radiation Oncology | Admitting: Radiation Oncology

## 2020-07-03 ENCOUNTER — Encounter: Payer: Self-pay | Admitting: Medical Oncology

## 2020-07-03 VITALS — BP 130/76 | HR 82 | Temp 98.0°F | Resp 20 | Ht 74.0 in | Wt 242.6 lb

## 2020-07-03 DIAGNOSIS — C61 Malignant neoplasm of prostate: Secondary | ICD-10-CM | POA: Insufficient documentation

## 2020-07-03 HISTORY — DX: Malignant neoplasm of prostate: C61

## 2020-07-03 NOTE — Progress Notes (Signed)
° °                              Care Plan Summary  Name: Joseph Fowler. Ackerman DOB: 11-18-1956   Your Medical Team:   Urologist -  Dr. Raynelle Bring, [facility]  Radiation Oncologist - Dr. Tyler Pita, [facility]   Medical Oncologist - Dr. Zola Button, [place]  Recommendations: 1) surgery, radiation     * These recommendations are based on information available as of todays consult.      Recommendations may change depending on the results of further tests or exams.    Next Steps: 1) Consider your options and call [name], RN    When appointments need to be scheduled, you will be contacted by [facility] and/or [facility].  Questions?  Please do not hesitate to call [name], RN, BSN, OCN at [phone] any questions or concerns.  [name] is your Oncology Nurse Navigator and is available to assist you while youre receiving your medical care at [facility].

## 2020-07-03 NOTE — Consult Note (Signed)
Multi-Disciplinary Clinic     07/03/2020   --------------------------------------------------------------------------------   Early Chars. Branch  MRN: [id number]  DOB: [birth year], 64 year old Male  SSN: [id number]   PRIMARY CARE:  Penni Homans, MD  REFERRING:  Penni Homans, MD  PROVIDER:  Link Snuffer, III, M.D.  TREATING:  Raynelle Bring, M.D.  LOCATION:  [facility] [phone]     --------------------------------------------------------------------------------   CC/HPI: CC: Prostate Cancer   PCP: Dr. Penni Homans  Location of consult: [facility] - Prostate Cancer Multidisciplinary Clinic   Mr. Joseph Fowler is a 64 year old gentleman who was initially diagnosed with low risk prostate cancer in August 2019. He had previously undergone a biopsy in 2011 that demonstrated atypia but no malignancy. His PSA increased to 7.29 and an MRI of the prostate had been performed in January 2019 and had indicated multiple suspicious lesions with some possible extension into the seminal vesicle. A cognitive fusion biopsy was performed in August 2019 and demonstrated low risk prostate cancer with 4 cores positive for Gleason 3+3=6 adenocarcinoma. He was counseled by Urology and Radiation Oncology in [facility] and also underwent an opinion at the [facility] multidisciplinary prostate cancer clinic. He elected to proceed with active surveillance management. He establish care with me in February 2020. Despite the above concerns, he elected continued active surveillance management. An MRI of the prostate was repeated in January 2021 and demonstrated a PI-RADS 4 lesion at the right base and a separate PI-RADS 3 lesion at the right apex. An MR/US fusion biopsy was performed on 06/18/20 and confirmed Gleason 3+4=7 adenocarcinoma with 14 out of 22 biopsy cores positive for malignancy.   Initial diagnosis: August 2019  Most recent biopsy: 06/18/20  TNM stage: cT1c N0 Mx  Gleason score: 3+4=7 (Grade  group 2)  PSA at diagnosis: 6.36  Biopsy (06/18/20): 14/22 cores positive  Left: L lateral apex (5%, 3+3=6), L apex (30%, 3+3=6), L lateral base (5%< 3+3=6)  Right: R apex (80%, 3+4=7), R lateral apex (70%, 3+4=7), R mid (50%, 3+3=6), R lateral mid (5%, 3+3=6), R lateral base (40%, 3+4=7, PNI)  ROI 1 target: 2/4 cores positive (30% - 3+4=7, 5%, 3+3=6)  ROI 2 target: 4/4 cores positive (90%, 90% - 3+4=7, 80%, 80% - 3+4=7)  Prostate volume: 75.5 cc  PSAD: 0.08   Family history: None   Imaging studies: MRI (Jan 2021): No EPE, SVI, LAD, or bone lesions.   PMH: He has a history of hypertension, hyperlipidemia.  PSH: Open appendectomy.   Nomogram  OC disease: 43%  EPE: 56%  SVI: 5%  LNI: 6%  PFS (5 year, 10 year): 81%, 70%   Urinary function: His baseline symptoms include a sense of incomplete emptying, intermittency, weak stream, nocturia, and frequency. His baseline IPSS on tamsulosin was 18.  Erectile function: SHIM score is 23. He will occasionally use PDE 5 inhibitors.     ALLERGIES: No Allergies    MEDICATIONS: Tamsulosin Hcl 0.4 mg capsule 1 capsule PO Q HS  Amlodipine Besylate 5 mg tablet Oral  Atorvastatin Calcium 10 mg tablet Oral  Calcium  Claritin 10 mg capsule  Losartan-Hydrochlorothiazide  Magnesium  Multiple Vitamin  Probiotic  Triamterene-Hydrochlorothiazid  Vitamin B 12  Vitamin C     GU PSH: Prostate Needle Biopsy - 06/18/2020 Vasectomy - 2010       [place] Notes: Surgery Of Male Genitalia Vasectomy, Eye Surgery, Appendectomy   NON-GU PSH: Appendectomy - 2010 Eye Surgery (Unspecified), Right Surgical Pathology, Gross  And Microscopic Examination For Prostate Needle - 06/18/2020     GU PMH: Epididymitis - 05/19/2020 Prostate Cancer - 02/15/2019 Weak Urinary Stream (Stable) - 02/15/2019, - 2019 Urinary Frequency - 2019 Primary hypogonadism - 2018 BPH w/LUTS, Benign prostatic hyperplasia with urinary obstruction - 2015      PMH Notes:   1) Prostate  cancer: He was initially diagnosed with low risk prostate cancer in August 2019. He had previously undergone a biopsy in 2011 that demonstrated atypia but no malignancy. An MRI of the prostate had been performed in January 2019 and had indicated multiple suspicious lesions with some possible extension into the seminal vesicle. A cognitive fusion biopsy was performed in August 2019 and demonstrated low risk prostate cancer. He was counseled by Urology and Radiation Oncology in [facility] and also underwent an opinion at the [facility] multidisciplinary prostate cancer clinic. He elected to proceed with active surveillance management. He establish care with me in February 2020. Despite the above concerns, he elected continued active surveillance management.   Initial diagnosis: August 2019  TNM stage: cT1c N0 Mx  Gleason score: 3+3=6 (Grade group 1)  PSA at diagnosis: 7.29  Biopsy (07/31/18 - by Dr. Tommi Rumps in [place]): 4/9 cores positive  Left: Left mid posterior (< 5%, 3+3=6)  Right: R apex (30%, 3+3=6), R mid (20%, 3+3=6), R posterior base (30%, 3+3=6)  Prostate volume: Unknown (measured at 76.1 cubic cm on MRI)  PSAD: 0.1   Surveillance:  Jan 2021: MRI -   2) BPH/LUTS: His baseline symptoms include a sense of incomplete emptying, intermittency, weak stream, nocturia, and frequency. His baseline IPSS on tamsulosin was 24.   Current treatment: Tamsulosin 0.4 mg     NON-GU PMH: Hypercholesterolemia Hypertension    FAMILY HISTORY: Family Health Status - Father alive at age 31 - 81 In Family Family Health Status - Mother's Age - Runs In Family Family Health Status Number - Runs In Family No pertinent family history - Other    Notes: 5 daughters  1 son     SOCIAL HISTORY: Marital Status: Married Preferred Language: English; Race: White Current Smoking Status: Patient does not smoke anymore.   Tobacco Use Assessment Completed: Used Tobacco in last 30 days? Drinks 1 caffeinated  drink per day.     Notes: Father deceased, Occupation, Mother alive, Former smoker, Tobacco use, Alcohol Use, Caffeine Use, Marital History - Currently Married   REVIEW OF SYSTEMS:    GU Review Male:   Patient denies frequent urination, hard to postpone urination, burning/ pain with urination, get up at night to urinate, leakage of urine, stream starts and stops, trouble starting your streams, and have to strain to urinate .  Gastrointestinal (Lower):   Patient denies diarrhea and constipation.  Gastrointestinal (Upper):   Patient denies nausea and vomiting.  Constitutional:   Patient denies fever, night sweats, weight loss, and fatigue.  Skin:   Patient denies skin rash/ lesion and itching.  Eyes:   Patient denies blurred vision and double vision.  Ears/ Nose/ Throat:   Patient denies sore throat and sinus problems.  Hematologic/Lymphatic:   Patient denies swollen glands and easy bruising.  Cardiovascular:   Patient denies leg swelling and chest pains.  Respiratory:   Patient denies cough and shortness of breath.  Endocrine:   Patient denies excessive thirst.  Musculoskeletal:   Patient denies back pain and joint pain.  Neurological:   Patient denies headaches and dizziness.  Psychologic:   Patient denies depression and anxiety.  VITAL SIGNS: None   GU PHYSICAL EXAMINATION:    Prostate: Prostate about 60 grams. No nodularity or induration.    MULTI-SYSTEM PHYSICAL EXAMINATION:    Constitutional: Well-nourished. No physical deformities. Normally developed. Good grooming.  Gastrointestinal: No mass, no tenderness, no rigidity, non obese abdomen. He has a right lower quadrant incision well healed.     Complexity of Data:  Lab Test Review:   PSA  Records Review:   Pathology Reports, Previous Patient Records   06/18/20 11/29/19 02/15/19 01/09/14 01/13/10 12/02/09  PSA  Total PSA 6.36 ng/mL 6.34 ng/mL 5.23 ng/mL 4.63  5.89  4.41   Free PSA    0.98  0.80  0.70   % Free PSA    21  13.6   15.9     PROCEDURES: None   ASSESSMENT:      ICD-10 Details  1 GU:   Prostate Cancer - C61    PLAN:           Document Letter(s):  Created for Patient: Clinical Summary         Notes:   1. Intermediate risk prostate cancer: I had a long detailed discussion with Mr. Werts, his wife, and his daughter today. We reviewed his recent biopsy and discussed how this represented clinical progression compared to his initial diagnosis of low risk disease. Considering his age and life expectancy along with his disease parameters. I did strongly recommend that he proceed with therapy of curative intent.   The patient was counseled about the natural history of prostate cancer and the standard treatment options that are available for prostate cancer. It was explained to him how his age and life expectancy, clinical stage, Gleason score, and PSA affect his prognosis, the decision to proceed with additional staging studies, as well as how that information influences recommended treatment strategies. We discussed the roles for active surveillance, radiation therapy, surgical therapy, androgen deprivation, as well as ablative therapy options for the treatment of prostate cancer as appropriate to his individual cancer situation. We discussed the risks and benefits of these options with regard to their impact on cancer control and also in terms of potential adverse events, complications, and impact on quality of life particularly related to urinary and sexual function. The patient was encouraged to ask questions throughout the discussion today and all questions were answered to his stated satisfaction. In addition, the patient was provided with and/or directed to appropriate resources and literature for further education about prostate cancer and treatment options. We discussed surgical therapy for prostate cancer including the different available surgical approaches. We discussed, in detail, the risks and expectations  of surgery with regard to cancer control, urinary control, and erectile function as well as the expected postoperative recovery process. Additional risks of surgery including but not limited to bleeding, infection, hernia formation, nerve damage, lymphocele formation, bowel/rectal injury potentially necessitating colostomy, damage to the urinary tract resulting in urine leakage, urethral stricture, and the cardiopulmonary risks such as myocardial infarction, stroke, death, venothromboembolism, etc. were explained. The risk of open surgical conversion for robotic/laparoscopic prostatectomy was also discussed.   After further discussion and having seen Dr. Tammi Klippel with Radiation Oncology today, he is most interested in proceeding with surgical therapy. This is also partially based on the fact that he does have significant voiding symptoms despite alpha-blocker therapy for BPH. He is going to tentatively be scheduled for a bilateral nerve-sparing robot assisted laparoscopic radical prostatectomy and would like to proceed later in the fall. He  will plan to see me preoperatively for further discussion and a physical exam.   CC: Dr. Penni Homans  Dr. Tyler Pita       `

## 2020-07-03 NOTE — Progress Notes (Signed)
[facility] Psychosocial Distress Screening Spiritual Care  Met with Kier, his wife Romie Minus, and one of their daughters in [facility] to introduce [facility] team/resources, reviewing distress screen per protocol.  The patient scored a 5 on the Psychosocial Distress Thermometer which indicates moderate distress. Also assessed for distress and other psychosocial needs.   ONCBCN DISTRESS SCREENING 07/03/2020  Screening Type Initial Screening  Distress experienced in past week (1-10) 5  Practical problem type Insurance  Emotional problem type Nervousness/Anxiety;Adjusting to illness  Physical Problem type Sleep/insomnia  Referral to support programs Yes   Provided empathic listening, normalization of feelings, and encouragement to check out support programming.  Follow up needed: No. Per family, no other needs at this time, but they know to contact Team with any needs or questions.   [place], [place], [facility] Pager [phone] Voicemail [phone]

## 2020-07-10 ENCOUNTER — Other Ambulatory Visit: Payer: Self-pay

## 2020-07-10 ENCOUNTER — Telehealth: Payer: Self-pay | Admitting: *Deleted

## 2020-07-10 ENCOUNTER — Telehealth (INDEPENDENT_AMBULATORY_CARE_PROVIDER_SITE_OTHER): Payer: 59 | Admitting: Family Medicine

## 2020-07-10 ENCOUNTER — Encounter: Payer: Self-pay | Admitting: Medical Oncology

## 2020-07-10 DIAGNOSIS — R739 Hyperglycemia, unspecified: Secondary | ICD-10-CM | POA: Diagnosis not present

## 2020-07-10 DIAGNOSIS — I10 Essential (primary) hypertension: Secondary | ICD-10-CM | POA: Diagnosis not present

## 2020-07-10 DIAGNOSIS — E782 Mixed hyperlipidemia: Secondary | ICD-10-CM | POA: Diagnosis not present

## 2020-07-10 DIAGNOSIS — C61 Malignant neoplasm of prostate: Secondary | ICD-10-CM

## 2020-07-10 NOTE — Telephone Encounter (Signed)
Left message on machine to call back   Needs appointment for CPE in 6 months

## 2020-07-10 NOTE — Progress Notes (Signed)
Left a message as follow up to the [facility]. Asked patient to call if I can answer questions or assist him in getting surgery scheduled with Dr. Alinda Money.

## 2020-07-11 NOTE — Assessment & Plan Note (Signed)
Encouraged heart healthy diet, increase exercise, avoid trans fats, consider a krill oil cap daily. Tolerating Atorvastatin qhs

## 2020-07-11 NOTE — Assessment & Plan Note (Signed)
Monitor and report any concerns, no changes to meds. Encouraged heart healthy diet such as the DASH diet and exercise as tolerated.  ?

## 2020-07-11 NOTE — Assessment & Plan Note (Signed)
He is working with urology and they have decided to proceed with surgery as opposed to radiation.

## 2020-07-11 NOTE — Progress Notes (Signed)
.Virtual Visit via Video Note  I connected with Joseph Fowler on 07/10/20 at 10:20 AM EDT by a video enabled telemedicine application and verified that I am speaking with the correct person using two identifiers.  Location: Patient: home, patient and provider were in visit Provider: home   I discussed the limitations of evaluation and management by telemedicine and the availability of in person appointments. The patient expressed understanding and agreed to proceed. Kem Boroughs, CMA was able to get the patient set up on a video visit    Subjective:    Patient ID: Joseph Fowler, male    DOB: [birth year], 64 y.o.   MRN: [id number]  Chief Complaint  Patient presents with  . Follow-up    HPI Patient is in today for follow up on chronic medical concerns. No recent febrile illness or hospitalizations. He is trying to eat well and stay busy. He reports his systolic blood pressure is generally in the 130s max of 093A, diastolic in the 35T and 73U. He has chosen not to take the COVID shots despite long conversation regarding the benefits. He believes his last Tetanus shot was in 2017 or 2018. Denies CP/palp/SOB/HA/congestion/fevers/GI or GU c/o. Taking meds as prescribed  Past Medical History:  Diagnosis Date  . Allergy   . Arthritis 09/06/2013  . Blood in stool 12/03/2012  . BPH (benign prostatic hyperplasia) 09/12/2012  . Chicken pox as a child  . Dermatitis 09/12/2012  . ED (erectile dysfunction) 09/12/2012  . Hyperlipidemia   . Hypertension 64 yrs old  . Nocturia 09/12/2012  . Obesity 09/12/2012  . Overweight(278.02) 09/12/2012  . Preventative health care 09/12/2012  . Prostate cancer ([place])   . Snoring 09/12/2012    Past Surgical History:  Procedure Laterality Date  . APPENDECTOMY  64 yrs old  . EYE SURGERY  2005   ,right, injury, decreased visual acuity, photophobia  . HERNIA REPAIR    . PROSTATE BIOPSY    . torn meninscus  2011   knee- right  . VASECTOMY  64 yrs old     Family History  Problem Relation Age of Onset  . Diabetes Father        type 2  . Other Brother        whole in heart  . Heart disease Brother        sudden cardiac death  . Stroke Maternal Grandmother        X 2  . Arthritis Paternal Uncle   . Colon cancer Neg Hx     Social History   Socioeconomic History  . Marital status: Married    Spouse name: Not on file  . Number of children: 1  . Years of education: Not on file  . Highest education level: Not on file  Occupational History  . Not on file  Tobacco Use  . Smoking status: Former Smoker    Packs/day: 2.00    Years: 8.00    Pack years: 16.00    Types: Cigarettes    Quit date: 12/26/1981    Years since quitting: 38.5  . Smokeless tobacco: Never Used  Vaping Use  . Vaping Use: Never used  Substance and Sexual Activity  . Alcohol use: No  . Drug use: No  . Sexual activity: Yes  Other Topics Concern  . Not on file  Social History Narrative  . Not on file   Social Determinants of Health   Financial Resource Strain:   . Difficulty of Paying Living  Expenses:   Food Insecurity:   . Worried About Charity fundraiser in the Last Year:   . Arboriculturist in the Last Year:   Transportation Needs:   . Film/video editor (Medical):   Marland Kitchen Lack of Transportation (Non-Medical):   Physical Activity:   . Days of Exercise per Week:   . Minutes of Exercise per Session:   Stress:   . Feeling of Stress :   Social Connections:   . Frequency of Communication with Friends and Family:   . Frequency of Social Gatherings with Friends and Family:   . Attends Religious Services:   . Active Member of Clubs or Organizations:   . Attends Archivist Meetings:   Marland Kitchen Marital Status:   Intimate Partner Violence:   . Fear of Current or Ex-Partner:   . Emotionally Abused:   Marland Kitchen Physically Abused:   . Sexually Abused:     Outpatient Medications Prior to Visit  Medication Sig Dispense Refill  . albuterol (PROVENTIL  HFA;VENTOLIN HFA) 108 (90 Base) MCG/ACT inhaler Inhale 2 puffs into the lungs every 6 (six) hours as needed for wheezing or shortness of breath. 1 Inhaler 2  . amLODipine (NORVASC) 5 MG tablet Take 1 tablet (5 mg total) by mouth 2 (two) times daily. 180 tablet 1  . ascorbic acid (VITAMIN C) 500 MG tablet Take by mouth.    Marland Kitchen atorvastatin (LIPITOR) 20 MG tablet TAKE 1 TABLET BY MOUTH ONCE DAILY APPOINTMENT  REQUIRED  FOR  FUTURE  REFILLS 30 tablet 0  . Cyanocobalamin (VITAMIN B-12 CR PO) Take 1 tablet by mouth daily.    Nyoka Cowden Tea, Camillia sinensis, (GREEN TEA PO) Take 2 capsules by mouth daily.    Marland Kitchen loratadine (CLARITIN) 10 MG tablet Take 1 tablet (10 mg total) by mouth daily. 90 tablet 1  . losartan (COZAAR) 100 MG tablet Take 1 tablet (100 mg total) by mouth at bedtime. 90 tablet 1  . Miconazole Nitrate 2 % LOTN Apply to lower legs daily after shower for 30 days. 56 g 1  . Multiple Vitamin (MULTIVITAMIN) tablet Take 1 tablet by mouth daily.    . sildenafil (VIAGRA) 100 MG tablet TAKE 1 TABLET BY MOUTH ONCE DAILY AS NEEDED FOR ERECTILE DYSFUNCTION 4 tablet 0  . tamsulosin (FLOMAX) 0.4 MG CAPS capsule Take 0.4 mg by mouth at bedtime.    . triamterene-hydrochlorothiazide (MAXZIDE-25) 37.5-25 MG tablet Take 1 tablet by mouth daily. 90 tablet 1  . VITAMIN D, ERGOCALCIFEROL, PO Take by mouth.    . Vitamins C E (CRANBERRY CONCENTRATE PO) Take 1 tablet by mouth daily.     No facility-administered medications prior to visit.    No Known Allergies  Review of Systems  Constitutional: Positive for malaise/fatigue. Negative for fever.  HENT: Negative for congestion.   Eyes: Negative for blurred vision.  Respiratory: Negative for shortness of breath.   Cardiovascular: Negative for chest pain, palpitations and leg swelling.  Gastrointestinal: Negative for abdominal pain, blood in stool and nausea.  Genitourinary: Negative for dysuria and frequency.  Musculoskeletal: Negative for falls.  Skin:  Negative for rash.  Neurological: Negative for dizziness, loss of consciousness and headaches.  Endo/Heme/Allergies: Negative for environmental allergies.  Psychiatric/Behavioral: Negative for depression. The patient is not nervous/anxious.        Objective:    Physical Exam Constitutional:      Appearance: Normal appearance. He is not ill-appearing.  HENT:     Head: Normocephalic and  atraumatic.     Right Ear: External ear normal.     Left Ear: External ear normal.     Nose: Nose normal.  Eyes:     General:        Right eye: No discharge.        Left eye: No discharge.  Pulmonary:     Effort: Pulmonary effort is normal.  Neurological:     Mental Status: He is alert and oriented to person, place, and time.  Psychiatric:        Behavior: Behavior normal.     BP 139/86   Temp 98 F (36.7 C)   Wt 242 lb (109.8 kg)   BMI 31.07 kg/m  Wt Readings from Last 3 Encounters:  07/10/20 242 lb (109.8 kg)  07/03/20 242 lb 9.6 oz (110 kg)  04/09/20 246 lb 3.2 oz (111.7 kg)    Diabetic Foot Exam - Simple   No data filed     Lab Results  Component Value Date   WBC 8.9 06/30/2020   HGB 14.7 06/30/2020   HCT 43.0 06/30/2020   PLT 245.0 06/30/2020   GLUCOSE 154 (H) 06/30/2020   CHOL 118 06/30/2020   TRIG 139.0 06/30/2020   HDL 44.70 06/30/2020   LDLDIRECT 60.0 10/11/2018   LDLCALC 45 06/30/2020   ALT 25 06/30/2020   AST 18 06/30/2020   NA 139 06/30/2020   K 3.5 06/30/2020   CL 100 06/30/2020   CREATININE 0.97 06/30/2020   BUN 18 06/30/2020   CO2 28 06/30/2020   TSH 1.71 06/30/2020   PSA 7.29 (H) 10/12/2017   HGBA1C 6.1 06/30/2020    Lab Results  Component Value Date   TSH 1.71 06/30/2020   Lab Results  Component Value Date   WBC 8.9 06/30/2020   HGB 14.7 06/30/2020   HCT 43.0 06/30/2020   MCV 88.6 06/30/2020   PLT 245.0 06/30/2020   Lab Results  Component Value Date   NA 139 06/30/2020   K 3.5 06/30/2020   CO2 28 06/30/2020   GLUCOSE 154 (H)  06/30/2020   BUN 18 06/30/2020   CREATININE 0.97 06/30/2020   BILITOT 1.3 (H) 06/30/2020   ALKPHOS 51 06/30/2020   AST 18 06/30/2020   ALT 25 06/30/2020   PROT 7.2 06/30/2020   ALBUMIN 4.7 06/30/2020   CALCIUM 9.6 06/30/2020   GFR 78.36 06/30/2020   Lab Results  Component Value Date   CHOL 118 06/30/2020   Lab Results  Component Value Date   HDL 44.70 06/30/2020   Lab Results  Component Value Date   LDLCALC 45 06/30/2020   Lab Results  Component Value Date   TRIG 139.0 06/30/2020   Lab Results  Component Value Date   CHOLHDL 3 06/30/2020   Lab Results  Component Value Date   HGBA1C 6.1 06/30/2020       Assessment & Plan:   Problem List Items Addressed This Visit    Hypertension    Monitor and report any concerns, no changes to meds. Encouraged heart healthy diet such as the DASH diet and exercise as tolerated.       Hyperglycemia    hgba1c acceptable, minimize simple carbs. Increase exercise as tolerated.       Cancer of prostate w/low recurrence risk (T1-2a, Gleason<7 & PSA<10) ([place])    He is working with urology and they have decided to proceed with surgery as opposed to radiation.       Hyperlipidemia, mixed    Encouraged  heart healthy diet, increase exercise, avoid trans fats, consider a krill oil cap daily. Tolerating Atorvastatin qhs         I am having Tamarion L. Franzel maintain his multivitamin, Cyanocobalamin (VITAMIN B-12 CR PO), Vitamins C E (CRANBERRY CONCENTRATE PO), (Green Tea, Camillia sinensis, (GREEN TEA PO)), Miconazole Nitrate, loratadine, albuterol, sildenafil, losartan, triamterene-hydrochlorothiazide, amLODipine, atorvastatin, (VITAMIN D, ERGOCALCIFEROL, PO), tamsulosin, and ascorbic acid.  No orders of the defined types were placed in this encounter.    I discussed the assessment and treatment plan with the patient. The patient was provided an opportunity to ask questions and all were answered. The patient agreed with the plan and  demonstrated an understanding of the instructions.   The patient was advised to call back or seek an in-person evaluation if the symptoms worsen or if the condition fails to improve as anticipated.  I provided 25 minutes of non-face-to-face time during this encounter.   Penni Homans, MD

## 2020-07-11 NOTE — Assessment & Plan Note (Signed)
hgba1c acceptable, minimize simple carbs. Increase exercise as tolerated.  

## 2020-07-22 ENCOUNTER — Other Ambulatory Visit: Payer: Self-pay | Admitting: Urology

## 2020-08-05 ENCOUNTER — Encounter: Payer: Self-pay | Admitting: Family Medicine

## 2020-08-24 ENCOUNTER — Other Ambulatory Visit: Payer: Self-pay | Admitting: Family Medicine

## 2020-09-01 NOTE — Patient Instructions (Addendum)
DUE TO COVID-19 ONLY ONE VISITOR IS ALLOWED TO COME WITH YOU AND STAY IN THE WAITING ROOM ONLY DURING  PRE OP AND PROCEDURE.   IF YOU WILL BE ADMITTED INTO THE HOSPITAL YOU ARE ALLOWED ONE SUPPORT PERSON DURING VISITATION HOURS  ONLY (10AM -8PM)   . The support person may change daily. . The support person must pass our screening, gel in and out, and wear a mask at all times, including in the patient's room. . Patients must also wear a mask when staff or their support person are in the room.   COVID SWAB TESTING MUST BE COMPLETED ON:  Monday, 09-07-20 @    [address] [place], [place] [address]  (Must self quarantine after testing. Follow instructions on handout.)        Your procedure is scheduled on:  Thursday, 09-10-20   Report to [facility] Main  Entrance    Report to admitting at 10:30 AM   Call this number if you have problems the morning of surgery [phone]   Follow Prep Per Surgeon's Office:  [place] not eat food :After Midnight.   May have liquids until 9:30 AM day of surgery  CLEAR LIQUID DIET  Foods Allowed                                                                     Foods Excluded  Water, Black Coffee and tea, regular and decaf        liquids that you cannot  Plain Jell-O in any flavor  (No red)                                     see through such as: Fruit ices (not with fruit pulp)                                      milk, soups, orange juice              Iced Popsicles (No red)                                      All solid food                                   Apple juices Sports drinks like Gatorade (No red) Lightly seasoned clear broth or consume(fat free) Sugar, honey syrup   Oral Hygiene is also important to reduce your risk of infection.                                    Remember - BRUSH YOUR TEETH THE MORNING OF SURGERY WITH  YOUR REGULAR TOOTHPASTE  Do NOT smoke after Midnight   Take these medicines the morning of surgery with A SIP OF WATER: Amlodipine, Atorvastatin, Loratadine                           You may not have any metal on your body including jewelry, and body piercings             Do not wear lotions, powders, perfumes/cologne, or deodorant             Men may shave face and neck.   Do not bring valuables to the hospital. [place].   Contacts, dentures or bridgework may not be worn into surgery.   Bring small overnight bag day of surgery.                 Please read over the following fact sheets you were given: IF YOU HAVE QUESTIONS ABOUT YOUR PRE OP INSTRUCTIONS  PLEASE CALL [phone]   [facility] - Preparing for Surgery Before surgery, you can play an important role.  Because skin is not sterile, your skin needs to be as free of germs as possible.  You can reduce the number of germs on your skin by washing with CHG (chlorahexidine gluconate) soap before surgery.  CHG is an antiseptic cleaner which kills germs and bonds with the skin to continue killing germs even after washing. Please DO NOT use if you have an allergy to CHG or antibacterial soaps.  If your skin becomes reddened/irritated stop using the CHG and inform your nurse when you arrive at Short Stay. Do not shave (including legs and underarms) for at least 48 hours prior to the first CHG shower.  You may shave your face/neck.  Please follow these instructions carefully:  1.  Shower with CHG Soap the night before surgery and the  morning of surgery.  2.  If you choose to wash your hair, wash your hair first as usual with your normal  shampoo.  3.  After you shampoo, rinse your hair and body thoroughly to remove the shampoo.                             4.  Use CHG as you would any other liquid soap.  You can apply chg directly to the skin and wash.  Gently with a scrungie or clean  washcloth.  5.  Apply the CHG Soap to your body ONLY FROM THE NECK DOWN.   Do   not use on face/ open                           Wound or open sores. Avoid contact with eyes, ears mouth and   genitals (private parts).                       Wash face,  Genitals (private parts) with your normal soap.             6.  Wash thoroughly, paying special attention to the area where your    surgery  will be performed.  7.  Thoroughly rinse your body with warm water from the neck down.  8.  DO NOT shower/wash with your normal soap after using and rinsing off the CHG Soap.  9.  Pat yourself dry with a clean towel.            10.  Wear clean pajamas.            11.  Place clean sheets on your bed the night of your first shower and do not  sleep with pets. Day of Surgery : Do not apply any lotions/deodorants the morning of surgery.  Please wear clean clothes to the hospital/surgery center.  FAILURE TO FOLLOW THESE INSTRUCTIONS MAY RESULT IN THE CANCELLATION OF YOUR SURGERY  PATIENT SIGNATURE_________________________________  NURSE SIGNATURE__________________________________  ________________________________________________________________________    Joseph Fowler  An incentive spirometer is a tool that can help keep your lungs clear and active. This tool measures how well you are filling your lungs with each breath. Taking long deep breaths may help reverse or decrease the chance of developing breathing (pulmonary) problems (especially infection) following:  A long period of time when you are unable to move or be active. BEFORE THE PROCEDURE   If the spirometer includes an indicator to show your best effort, your nurse or respiratory therapist will set it to a desired goal.  If possible, sit up straight or lean slightly forward. Try not to slouch.  Hold the incentive spirometer in an upright position. INSTRUCTIONS FOR USE  1. Sit on the edge of your bed if possible, or sit  up as far as you can in bed or on a chair. 2. Hold the incentive spirometer in an upright position. 3. Breathe out normally. 4. Place the mouthpiece in your mouth and seal your lips tightly around it. 5. Breathe in slowly and as deeply as possible, raising the piston or the ball toward the top of the column. 6. Hold your breath for 3-5 seconds or for as long as possible. Allow the piston or ball to fall to the bottom of the column. 7. Remove the mouthpiece from your mouth and breathe out normally. 8. Rest for a few seconds and repeat Steps 1 through 7 at least 10 times every 1-2 hours when you are awake. Take your time and take a few normal breaths between deep breaths. 9. The spirometer may include an indicator to show your best effort. Use the indicator as a goal to work toward during each repetition. 10. After each set of 10 deep breaths, practice coughing to be sure your lungs are clear. If you have an incision (the cut made at the time of surgery), support your incision when coughing by placing a pillow or rolled up towels firmly against it. Once you are able to get out of bed, walk around indoors and cough well. You may stop using the incentive spirometer when instructed by your caregiver.  RISKS AND COMPLICATIONS  Take your time so you do not get dizzy or light-headed.  If you are in pain, you may need to take or ask for pain medication before doing incentive spirometry. It is harder to take a deep breath if you are having pain. AFTER USE  Rest and breathe slowly and easily.  It can be helpful to keep track of a log of your progress. Your caregiver can provide you with a simple table to help with this. If you are using the spirometer at home, follow these instructions: [place] IF:   You are having difficultly using the spirometer.  You have trouble using the spirometer as often as instructed.  Your pain medication is not giving enough relief while using the  spirometer.  You develop fever of 100.5 F (38.1 C) or higher. SEEK IMMEDIATE MEDICAL CARE IF:   You cough up bloody sputum that had not been present before.  You develop fever of 102 F (38.9 C) or greater.  You develop worsening pain at or near the incision site. MAKE SURE YOU:   Understand these instructions.  Will watch your condition.  Will get help right away if you are not doing well or get worse. Document Released: 04/24/2007 Document Revised: 03/05/2012 Document Reviewed: 06/25/2007 [employer] Patient Information 2014 [employer], [place].   ________________________________________________________________________  WHAT IS A BLOOD TRANSFUSION? Blood Transfusion Information  A transfusion is the replacement of blood or some of its parts. Blood is made up of multiple cells which provide different functions.  Red blood cells carry oxygen and are used for blood loss replacement.  White blood cells fight against infection.  Platelets control bleeding.  Plasma helps clot blood.  Other blood products are available for specialized needs, such as hemophilia or other clotting disorders. BEFORE THE TRANSFUSION  Who gives blood for transfusions?   Healthy volunteers who are fully evaluated to make sure their blood is safe. This is blood bank blood. Transfusion therapy is the safest it has ever been in the practice of medicine. Before blood is taken from a donor, a complete history is taken to make sure that person has no history of diseases nor engages in risky social behavior (examples are intravenous drug use or sexual activity with multiple partners). The donor's travel history is screened to minimize risk of transmitting infections, such as malaria. The donated blood is tested for signs of infectious diseases, such as HIV and hepatitis. The blood is then tested to be sure it is compatible with you in order to minimize the chance of a transfusion reaction. If you or a relative  donates blood, this is often done in anticipation of surgery and is not appropriate for emergency situations. It takes many days to process the donated blood. RISKS AND COMPLICATIONS Although transfusion therapy is very safe and saves many lives, the main dangers of transfusion include:   Getting an infectious disease.  Developing a transfusion reaction. This is an allergic reaction to something in the blood you were given. Every precaution is taken to prevent this. The decision to have a blood transfusion has been considered carefully by your caregiver before blood is given. Blood is not given unless the benefits outweigh the risks. AFTER THE TRANSFUSION  Right after receiving a blood transfusion, you will usually feel much better and more energetic. This is especially true if your red blood cells have gotten low (anemic). The transfusion raises the level of the red blood cells which carry oxygen, and this usually causes an energy increase.  The nurse administering the transfusion will monitor you carefully for complications. HOME CARE INSTRUCTIONS  No special instructions are needed after a transfusion. You may find your energy is better. Speak with your caregiver about any limitations on activity for underlying diseases you may have. SEEK MEDICAL CARE IF:   Your condition is not improving after your transfusion.  You develop redness or irritation at the intravenous (IV) site. SEEK IMMEDIATE MEDICAL CARE IF:  Any of the following symptoms occur over the next 12 hours:  Shaking chills.  You have a temperature by mouth above 102 F (38.9 C), not controlled by medicine.  Chest, back, or muscle pain.  People around you feel you are not acting correctly or are confused.  Shortness of  breath or difficulty breathing.  Dizziness and fainting.  You get a rash or develop hives.  You have a decrease in urine output.  Your urine turns a dark color or changes to pink, red, or brown. Any  of the following symptoms occur over the next 10 days:  You have a temperature by mouth above 102 F (38.9 C), not controlled by medicine.  Shortness of breath.  Weakness after normal activity.  The white part of the eye turns yellow (jaundice).  You have a decrease in the amount of urine or are urinating less often.  Your urine turns a dark color or changes to pink, red, or brown. Document Released: 12/09/2000 Document Revised: 03/05/2012 Document Reviewed: 07/28/2008 [facility] Patient Information 2014 [place], [place].  _______________________________________________________________________

## 2020-09-01 NOTE — Progress Notes (Signed)
COVID Vaccine Completed: Date COVID Vaccine completed: COVID vaccine manufacturer: Pfizer    Moderna   [employer]   PCP - Penni Homans, MD Cardiologist -   Chest x-ray -  EKG -  Stress Test -  ECHO -  Cardiac Cath -   Sleep Study -  CPAP -   Fasting Blood Sugar -  Checks Blood Sugar _____ times a day  Blood Thinner Instructions: Aspirin Instructions: Last Dose:  Anesthesia review:   Patient denies shortness of breath, fever, cough and chest pain at PAT appointment   Patient verbalized understanding of instructions that were given to them at the PAT appointment. Patient was also instructed that they will need to review over the PAT instructions again at home before surgery.

## 2020-09-03 ENCOUNTER — Encounter (HOSPITAL_COMMUNITY)
Admission: RE | Admit: 2020-09-03 | Discharge: 2020-09-03 | Disposition: A | Discharge status: Home / Self Care | Payer: 59 | Source: Ambulatory Visit | Attending: Urology | Admitting: Urology

## 2020-10-02 NOTE — Patient Instructions (Addendum)
DUE TO COVID-19 ONLY ONE VISITOR IS ALLOWED TO COME WITH YOU AND STAY IN THE WAITING ROOM ONLY DURING PRE OP AND PROCEDURE.   IF YOU WILL BE ADMITTED INTO THE HOSPITAL YOU ARE ALLOWED ONE SUPPORT PERSON DURING VISITATION HOURS ONLY (10AM -8PM)   . The support person may change daily. . The support person must pass our screening, gel in and out, and wear a mask at all times, including in the patient's room. . Patients must also wear a mask when staff or their support person are in the room.   COVID SWAB TESTING MUST BE COMPLETED ON:   Monday, 10-12-20 @ 11:00 AM   [address] [place], [place] [address]  (Must self quarantine after testing. Follow instructions on handout.)        Your procedure is scheduled on:   Thursday, 10-15-20   Report to [facility] Main  Entrance   Report to admitting at 10:30 AM   Call this number if you have problems the morning of surgery [phone]   Prep:  8 OZ OF MAGNESIUM CITRATE AT [place] not eat food :After Midnight.   May have liquids until 9:30 AM day of surgery  CLEAR LIQUID DIET  Foods Allowed                                                                     Foods Excluded  Water, Black Coffee and tea, regular and decaf               liquids that you cannot  Plain Jell-O in any flavor  (No red)                                      see through such as: Fruit ices (not with fruit pulp)                                      milk, soups, orange juice              Iced Popsicles (No red)                                      All solid food                                   Apple juices Sports drinks like Gatorade (No red) Lightly seasoned clear broth or consume(fat free) Sugar, honey syrup   Oral Hygiene is also important to reduce your risk of infection.                                    Remember - BRUSH YOUR TEETH THE MORNING OF SURGERY WITH YOUR REGULAR  TOOTHPASTE   Do NOT smoke after Midnight  Take these medicines the morning of surgery with A SIP OF WATER: Amlodipine, Atorvastatin, Loratadine.  Okay to use  Albuterol inhaler and bring with you day of surgery                                You may not have any metal on your body including  jewelry, and body piercings             Do not wear  lotions, powders, perfumes/cologne, or deodorant             Men may shave face and neck.   Do not bring valuables to the hospital. [place].   Contacts, dentures or bridgework may not be worn into surgery.   Bring small overnight bag day of surgery.    Please read over the following fact sheets you were given: IF YOU HAVE QUESTIONS ABOUT YOUR PRE OP INSTRUCTIONS PLEASE CALL [phone]   [facility] - Preparing for Surgery Before surgery, you can play an important role.  Because skin is not sterile, your skin needs to be as free of germs as possible.  You can reduce the number of germs on your skin by washing with CHG (chlorahexidine gluconate) soap before surgery.  CHG is an antiseptic cleaner which kills germs and bonds with the skin to continue killing germs even after washing. Please DO NOT use if you have an allergy to CHG or antibacterial soaps.  If your skin becomes reddened/irritated stop using the CHG and inform your nurse when you arrive at Short Stay. Do not shave (including legs and underarms) for at least 48 hours prior to the first CHG shower.  You may shave your face/neck.  Please follow these instructions carefully:  1.  Shower with CHG Soap the night before surgery and the  morning of surgery.  2.  If you choose to wash your hair, wash your hair first as usual with your normal  shampoo.  3.  After you shampoo, rinse your hair and body thoroughly to remove the shampoo.                             4.  Use CHG as you would any other liquid soap.  You can apply chg directly to the skin and wash.   Gently with a scrungie or clean washcloth.  5.  Apply the CHG Soap to your body ONLY FROM THE NECK DOWN.   Do   not use on face/ open                           Wound or open sores. Avoid contact with eyes, ears mouth and   genitals (private parts).                       Wash face,  Genitals (private parts) with your normal soap.             6.  Wash thoroughly, paying special attention to the area where your    surgery  will be performed.  7.  Thoroughly rinse your body with warm water from the neck down.  8.  DO NOT shower/wash with your normal soap after using and rinsing off the CHG Soap.  9.  Pat yourself dry with a clean towel.            10.  Wear clean pajamas.            11.  Place clean sheets on your bed the night of your first shower and do not  sleep with pets. Day of Surgery : Do not apply any lotions/deodorants the morning of surgery.  Please wear clean clothes to the hospital/surgery center.  FAILURE TO FOLLOW THESE INSTRUCTIONS MAY RESULT IN THE CANCELLATION OF YOUR SURGERY  PATIENT SIGNATURE_________________________________  NURSE SIGNATURE__________________________________  ________________________________________________________________________   Adam Phenix  An incentive spirometer is a tool that can help keep your lungs clear and active. This tool measures how well you are filling your lungs with each breath. Taking long deep breaths may help reverse or decrease the chance of developing breathing (pulmonary) problems (especially infection) following:  A long period of time when you are unable to move or be active. BEFORE THE PROCEDURE   If the spirometer includes an indicator to show your best effort, your nurse or respiratory therapist will set it to a desired goal.  If possible, sit up straight or lean slightly forward. Try not to slouch.  Hold the incentive spirometer in an upright position. INSTRUCTIONS FOR USE  1. Sit on the edge of  your bed if possible, or sit up as far as you can in bed or on a chair. 2. Hold the incentive spirometer in an upright position. 3. Breathe out normally. 4. Place the mouthpiece in your mouth and seal your lips tightly around it. 5. Breathe in slowly and as deeply as possible, raising the piston or the ball toward the top of the column. 6. Hold your breath for 3-5 seconds or for as long as possible. Allow the piston or ball to fall to the bottom of the column. 7. Remove the mouthpiece from your mouth and breathe out normally. 8. Rest for a few seconds and repeat Steps 1 through 7 at least 10 times every 1-2 hours when you are awake. Take your time and take a few normal breaths between deep breaths. 9. The spirometer may include an indicator to show your best effort. Use the indicator as a goal to work toward during each repetition. 10. After each set of 10 deep breaths, practice coughing to be sure your lungs are clear. If you have an incision (the cut made at the time of surgery), support your incision when coughing by placing a pillow or rolled up towels firmly against it. Once you are able to get out of bed, walk around indoors and cough well. You may stop using the incentive spirometer when instructed by your caregiver.  RISKS AND COMPLICATIONS  Take your time so you do not get dizzy or light-headed.  If you are in pain, you may need to take or ask for pain medication before doing incentive spirometry. It is harder to take a deep breath if you are having pain. AFTER USE  Rest and breathe slowly and easily.  It can be helpful to keep track of a log of your progress. Your caregiver can provide you with a simple table to help with this. If you are using the spirometer at home, follow these instructions: [place] IF:   You are having difficultly using the spirometer.  You have trouble using the spirometer as often as instructed.  Your pain medication is not giving enough relief  while using the spirometer.  You develop fever of 100.5 F (38.1 C) or higher. SEEK IMMEDIATE MEDICAL CARE IF:   You cough up bloody sputum that had not been present before.  You develop fever of 102 F (38.9 C) or greater.  You develop worsening pain at or near the incision site. MAKE SURE YOU:   Understand these instructions.  Will watch your condition.  Will get help right away if you are not doing well or get worse. Document Released: 04/24/2007 Document Revised: 03/05/2012 Document Reviewed: 06/25/2007 Exit[employer] Patient Information 2014 [employer], [place].   ________________________________________________________________________  WHAT IS A BLOOD TRANSFUSION? Blood Transfusion Information  A transfusion is the replacement of blood or some of its parts. Blood is made up of multiple cells which provide different functions.  Red blood cells carry oxygen and are used for blood loss replacement.  White blood cells fight against infection.  Platelets control bleeding.  Plasma helps clot blood.  Other blood products are available for specialized needs, such as hemophilia or other clotting disorders. BEFORE THE TRANSFUSION  Who gives blood for transfusions?   Healthy volunteers who are fully evaluated to make sure their blood is safe. This is blood bank blood. Transfusion therapy is the safest it has ever been in the practice of medicine. Before blood is taken from a donor, a complete history is taken to make sure that person has no history of diseases nor engages in risky social behavior (examples are intravenous drug use or sexual activity with multiple partners). The donor's travel history is screened to minimize risk of transmitting infections, such as malaria. The donated blood is tested for signs of infectious diseases, such as HIV and hepatitis. The blood is then tested to be sure it is compatible with you in order to minimize the chance of a transfusion reaction. If you or  a relative donates blood, this is often done in anticipation of surgery and is not appropriate for emergency situations. It takes many days to process the donated blood. RISKS AND COMPLICATIONS Although transfusion therapy is very safe and saves many lives, the main dangers of transfusion include:   Getting an infectious disease.  Developing a transfusion reaction. This is an allergic reaction to something in the blood you were given. Every precaution is taken to prevent this. The decision to have a blood transfusion has been considered carefully by your caregiver before blood is given. Blood is not given unless the benefits outweigh the risks. AFTER THE TRANSFUSION  Right after receiving a blood transfusion, you will usually feel much better and more energetic. This is especially true if your red blood cells have gotten low (anemic). The transfusion raises the level of the red blood cells which carry oxygen, and this usually causes an energy increase.  The nurse administering the transfusion will monitor you carefully for complications. HOME CARE INSTRUCTIONS  No special instructions are needed after a transfusion. You may find your energy is better. Speak with your caregiver about any limitations on activity for underlying diseases you may have. SEEK MEDICAL CARE IF:   Your condition is not improving after your transfusion.  You develop redness or irritation at the intravenous (IV) site. SEEK IMMEDIATE MEDICAL CARE IF:  Any of the following symptoms occur over the next 12 hours:  Shaking chills.  You have a temperature by mouth above 102 F (38.9 C), not controlled by medicine.  Chest, back, or muscle pain.  People around you feel you are not acting correctly or are confused.  Shortness of  breath or difficulty breathing.  Dizziness and fainting.  You get a rash or develop hives.  You have a decrease in urine output.  Your urine turns a dark color or changes to pink, red, or  brown. Any of the following symptoms occur over the next 10 days:  You have a temperature by mouth above 102 F (38.9 C), not controlled by medicine.  Shortness of breath.  Weakness after normal activity.  The white part of the eye turns yellow (jaundice).  You have a decrease in the amount of urine or are urinating less often.  Your urine turns a dark color or changes to pink, red, or brown. Document Released: 12/09/2000 Document Revised: 03/05/2012 Document Reviewed: 07/28/2008 [facility] Patient Information 2014 [place], [place].  _______________________________________________________________________

## 2020-10-02 NOTE — Progress Notes (Addendum)
COVID Vaccine Completed:  x1 Date COVID Vaccine completed:  August 2021 COVID vaccine manufacturer: [place]   PCP - Penni Homans, MD Cardiologist -   Chest x-ray -  EKG - 10-05-20 in Epic Stress Test -  ECHO -  Cardiac Cath -  Pacemaker/ICD device last checked:  Sleep Study -  CPAP -   Fasting Blood Sugar -  Checks Blood Sugar _____ times a day  Blood Thinner Instructions: Aspirin Instructions: Last Dose:  Anesthesia review:   Patient denies shortness of breath, fever, cough and chest pain at PAT appointment   Patient verbalized understanding of instructions that were given to them at the PAT appointment. Patient was also instructed that they will need to review over the PAT instructions again at home before surgery.

## 2020-10-05 ENCOUNTER — Other Ambulatory Visit: Payer: Self-pay

## 2020-10-05 ENCOUNTER — Encounter (HOSPITAL_COMMUNITY)
Admission: RE | Admit: 2020-10-05 | Discharge: 2020-10-05 | Disposition: A | Discharge status: Home / Self Care | Payer: 59 | Source: Ambulatory Visit | Attending: Urology | Admitting: Urology

## 2020-10-05 ENCOUNTER — Encounter (HOSPITAL_COMMUNITY): Payer: Self-pay

## 2020-10-05 DIAGNOSIS — I10 Essential (primary) hypertension: Secondary | ICD-10-CM | POA: Diagnosis not present

## 2020-10-05 HISTORY — DX: Other specified postprocedural states: R11.2

## 2020-10-05 HISTORY — DX: Other specified postprocedural states: Z98.890

## 2020-10-05 LAB — BASIC METABOLIC PANEL
Anion gap: 10 (ref 5–15)
BUN: 19 mg/dL (ref 8–23)
CO2: 25 mmol/L (ref 22–32)
Calcium: 9.3 mg/dL (ref 8.9–10.3)
Chloride: 102 mmol/L (ref 98–111)
Creatinine, Ser: 0.97 mg/dL (ref 0.61–1.24)
GFR, Estimated: >60 mL/min (ref >60)
Glucose, Bld: 94 mg/dL (ref 70–99)
Potassium: 3.1 mmol/L — ABNORMAL LOW (ref 3.5–5.1)
Sodium: 137 mmol/L (ref 135–145)

## 2020-10-05 LAB — CBC
HCT: 41.7 % (ref 39.0–52.0)
Hemoglobin: 14.4 g/dL (ref 13.0–17.0)
MCH: 30.1 pg (ref 26.0–34.0)
MCHC: 34.5 g/dL (ref 30.0–36.0)
MCV: 87.2 fL (ref 80.0–100.0)
Platelets: 250 10*3/uL (ref 150–400)
RBC: 4.78 MIL/uL (ref 4.22–5.81)
RDW: 13 % (ref 11.5–15.5)
WBC: 8.5 10*3/uL (ref 4.0–10.5)
nRBC: 0 % (ref 0.0–0.2)

## 2020-10-05 NOTE — Progress Notes (Signed)
   10/05/20 1430  OBSTRUCTIVE SLEEP APNEA  Have you ever been diagnosed with sleep apnea through a sleep study? No  Do you snore loudly (loud enough to be heard through closed doors)?  1  Do you often feel tired, fatigued, or sleepy during the daytime (such as falling asleep during driving or talking to someone)? 0  Has anyone observed you stop breathing during your sleep? 1  Do you have, or are you being treated for high blood pressure? 1  BMI more than 35 kg/m2? 0  Age > 50 (1-yes) 1  Neck circumference greater than:Male 16 inches or larger, Male 17inches or larger? 1  Male Gender (Yes=1) 1  Obstructive Sleep Apnea Score 6

## 2020-10-12 ENCOUNTER — Inpatient Hospital Stay (HOSPITAL_COMMUNITY)
Admission: RE | Admit: 2020-10-12 | Discharge: 2020-10-12 | Disposition: A | Discharge status: Home / Self Care | Payer: 59 | Source: Ambulatory Visit

## 2020-10-12 ENCOUNTER — Other Ambulatory Visit (HOSPITAL_COMMUNITY)
Admission: RE | Admit: 2020-10-12 | Discharge: 2020-10-12 | Disposition: A | Discharge status: Home / Self Care | Payer: 59 | Source: Ambulatory Visit | Attending: Urology | Admitting: Urology

## 2020-10-12 DIAGNOSIS — Z20822 Contact with and (suspected) exposure to covid-19: Secondary | ICD-10-CM | POA: Insufficient documentation

## 2020-10-12 DIAGNOSIS — Z01818 Encounter for other preprocedural examination: Secondary | ICD-10-CM | POA: Diagnosis present

## 2020-10-12 LAB — SARS CORONAVIRUS 2 (TAT 6-24 HRS): SARS Coronavirus 2: NEGATIVE

## 2020-10-14 ENCOUNTER — Encounter (HOSPITAL_COMMUNITY): Payer: Self-pay | Admitting: Urology

## 2020-10-14 NOTE — Progress Notes (Signed)
Called patient and left message for patient to arrive at 1010 AM instead of 1030 for surgery on 10/15/20. Asked patient to call back that he received this message.

## 2020-10-14 NOTE — H&P (Signed)
Office Visit Report     09/29/2020   --------------------------------------------------------------------------------   Early Chars. Manera  MRN: [id number]  DOB: 03-16-1956, 64 year old Male  SSN: [id number]   PRIMARY CARE:  Penni Homans, MD  REFERRING:  Penni Homans, MD  PROVIDER:  Link Snuffer, Shirley Friar, M.D.  TREATING:  Raynelle Bring, M.D.  LOCATION:  [facility] [phone]     --------------------------------------------------------------------------------   CC/HPI: CC: Prostate Cancer   Mr. Joseph Fowler is a 64 year old gentleman who was initially diagnosed with low risk prostate cancer in August 2019. He had previously undergone a biopsy in 2011 that demonstrated atypia but no malignancy. His PSA increased to 7.29 and an MRI of the prostate had been performed in January 2019 and had indicated multiple suspicious lesions with some possible extension into the seminal vesicle. A cognitive fusion biopsy was performed in August 2019 and demonstrated low risk prostate cancer with 4 cores positive for Gleason 3+3=6 adenocarcinoma. He was counseled by Urology and Radiation Oncology in [facility] and also underwent an opinion at the [facility] multidisciplinary prostate cancer clinic. He elected to proceed with active surveillance management. He establish care with me in February 2020. Despite the above concerns, he elected continued active surveillance management. An MRI of the prostate was repeated in January 2021 and demonstrated a PI-RADS 4 lesion at the right base and a separate PI-RADS 3 lesion at the right apex. An MR/US fusion biopsy was performed on 06/18/20 and confirmed Gleason 3+4=7 adenocarcinoma with 14 out of 22 biopsy cores positive for malignancy.   He has elected to proceed with surgical therapy and follows up today for further discussion.   Initial diagnosis: August 2019  Most recent biopsy: 06/18/20  TNM stage: cT1c N0 Mx  Gleason score: 3+4=7 (Grade group 2)  PSA at  diagnosis: 6.36  Biopsy (06/18/20): 14/22 cores positive  Left: L lateral apex (5%, 3+3=6), L apex (30%, 3+3=6), L lateral base (5%< 3+3=6)  Right: R apex (80%, 3+4=7), R lateral apex (70%, 3+4=7), R mid (50%, 3+3=6), R lateral mid (5%, 3+3=6), R lateral base (40%, 3+4=7, PNI)  ROI 1 target: 2/4 cores positive (30% - 3+4=7, 5%, 3+3=6)  ROI 2 target: 4/4 cores positive (90%, 90% - 3+4=7, 80%, 80% - 3+4=7)  Prostate volume: 75.5 cc  PSAD: 0.08   Family history: None   Imaging studies: MRI (Jan 2021): No EPE, SVI, LAD, or bone lesions.   PMH: He has a history of hypertension, hyperlipidemia.  PSH: Open appendectomy.   Nomogram  OC disease: 43%  EPE: 56%  SVI: 5%  LNI: 6%  PFS (5 year, 10 year): 81%, 70%   Urinary function: His baseline symptoms include a sense of incomplete emptying, intermittency, weak stream, nocturia, and frequency. His baseline IPSS on tamsulosin was 18.  Erectile function: SHIM score is 23. He will occasionally use PDE 5 inhibitors.     ALLERGIES: None   MEDICATIONS: Tamsulosin Hcl 0.4 mg capsule 1 capsule PO Q HS  Amlodipine Besylate 5 mg tablet Oral  Atorvastatin Calcium 10 mg tablet Oral  Calcium  Claritin 10 mg capsule  Losartan-Hydrochlorothiazide  Magnesium  Multiple Vitamin  Probiotic  Triamterene-Hydrochlorothiazid  Vitamin B 12  Vitamin C     GU PSH: Prostate Needle Biopsy - 06/18/2020 Vasectomy - 2010       [facility] Notes: Surgery Of Male Genitalia Vasectomy, Eye Surgery, Appendectomy   NON-GU PSH: Appendectomy - 2010 Eye Surgery (Unspecified), Right Surgical Pathology, Gross And  Microscopic Examination For Prostate Needle - 06/18/2020     GU PMH: Stress Incontinence - 09/01/2020 Epididymitis - 05/19/2020 Prostate Cancer - 2020 Weak Urinary Stream (Stable) - 2020, - 2019 Urinary Frequency - 2019 Primary hypogonadism - 2018 BPH w/LUTS, Benign prostatic hyperplasia with urinary obstruction - 2015      PMH Notes:   1) Prostate cancer:  He was initially diagnosed with low risk prostate cancer in August 2019. He had previously undergone a biopsy in 2011 that demonstrated atypia but no malignancy. An MRI of the prostate had been performed in January 2019 and had indicated multiple suspicious lesions with some possible extension into the seminal vesicle. A cognitive fusion biopsy was performed in August 2019 and demonstrated low risk prostate cancer. He was counseled by Urology and Radiation Oncology in [facility] and also underwent an opinion at the [facility] multidisciplinary prostate cancer clinic. He elected to proceed with active surveillance management. He establish care with me in February 2020. Despite the above concerns, he elected continued active surveillance management.   Initial diagnosis: August 2019  TNM stage: cT1c N0 Mx  Gleason score: 3+3=6 (Grade group 1)  PSA at diagnosis: 7.29  Biopsy (07/31/18 - by Dr. Tommi Rumps in [place]): 4/9 cores positive  Left: Left mid posterior (< 5%, 3+3=6)  Right: R apex (30%, 3+3=6), R mid (20%, 3+3=6), R posterior base (30%, 3+3=6)  Prostate volume: Unknown (measured at 76.1 cubic cm on MRI)  PSAD: 0.1   Surveillance:  Jan 2021: MRI -   2) BPH/LUTS: His baseline symptoms include a sense of incomplete emptying, intermittency, weak stream, nocturia, and frequency. His baseline IPSS on tamsulosin was 24.   Current treatment: Tamsulosin 0.4 mg     NON-GU PMH: Muscle weakness (generalized) - 09/01/2020, - 08/20/2020 Other muscle spasm - 09/01/2020, - 08/20/2020 Other specified disorders of muscle - 08/20/2020 Hypercholesterolemia Hypertension    FAMILY HISTORY: Family Health Status - Father alive at age 27 - 9 In Family Family Health Status - Mother's Age - Runs In Family Family Health Status Number - Runs In Family No pertinent family history - Other    Notes: 5 daughters  1 son     SOCIAL HISTORY: Marital Status: Married Preferred Language: English; Race: White Current  Smoking Status: Patient does not smoke anymore.   Tobacco Use Assessment Completed: Used Tobacco in last 30 days? Drinks 1 caffeinated drink per day.     Notes: Father deceased, Occupation, Mother alive, Former smoker, Tobacco use, Alcohol Use, Caffeine Use, Marital History - Currently Married   REVIEW OF SYSTEMS:    GU Review Male:   Patient denies frequent urination, hard to postpone urination, burning/ pain with urination, get up at night to urinate, leakage of urine, stream starts and stops, trouble starting your streams, and have to strain to urinate .  Gastrointestinal (Lower):   Patient denies diarrhea and constipation.  Gastrointestinal (Upper):   Patient denies nausea and vomiting.  Constitutional:   Patient denies fever, night sweats, weight loss, and fatigue.  Skin:   Patient denies skin rash/ lesion and itching.  Eyes:   Patient denies blurred vision and double vision.  Ears/ Nose/ Throat:   Patient denies sore throat and sinus problems.  Hematologic/Lymphatic:   Patient denies swollen glands and easy bruising.  Cardiovascular:   Patient denies leg swelling and chest pains.  Respiratory:   Patient denies cough and shortness of breath.  Endocrine:   Patient denies excessive thirst.  Musculoskeletal:   Patient denies  joint pain and back pain.  Neurological:   Patient denies headaches and dizziness.  Psychologic:   Patient denies depression and anxiety.   VITAL SIGNS:      09/29/2020 09:24 AM  Weight 245 lb / 111.13 kg  Height 74 in / 187.96 cm  BP 128/80 mmHg  Pulse 73 /min  Temperature 97.1 F / 36.1 C  BMI 31.5 kg/m   MULTI-SYSTEM PHYSICAL EXAMINATION:    Constitutional: Well-nourished. No physical deformities. Normally developed. Good grooming.  Neck: Neck symmetrical, not swollen. Normal tracheal position.  Respiratory: No labored breathing, no use of accessory muscles. Clear bilaterally.  Cardiovascular: Normal temperature, normal extremity pulses, no swelling, no  varicosities. Regular rate and rhythm.  Lymphatic: No enlargement of neck, axillae, groin.  Skin: No paleness, no jaundice, no cyanosis. No lesion, no ulcer, no rash.  Neurologic / Psychiatric: Oriented to time, oriented to place, oriented to person. No depression, no anxiety, no agitation.  Gastrointestinal: No mass, no tenderness, no rigidity, non obese abdomen.  Eyes: Normal conjunctivae. Normal eyelids.  Ears, Nose, Mouth, and Throat: Left ear no scars, no lesions, no masses. Right ear no scars, no lesions, no masses. Nose no scars, no lesions, no masses. Normal hearing. Normal lips.  Musculoskeletal: Normal gait and station of head and neck.     Complexity of Data:  Lab Test Review:   PSA  Records Review:   Pathology Reports, Previous Patient Records  X-Ray Review: MRI Prostate GSORAD: Reviewed Films.     06/18/20 11/29/19 02/15/19 01/09/14 01/13/10 12/02/09  PSA  Total PSA 6.36 ng/mL 6.34 ng/mL 5.23 ng/mL 4.63  5.89  4.41   Free PSA    0.98  0.80  0.70   % Free PSA    21  13.6  15.9     PROCEDURES:          Urinalysis Dipstick Dipstick Cont'd  Color: Yellow Bilirubin: Neg mg/dL  Appearance: Clear Ketones: Neg mg/dL  Specific Gravity: 1.015 Blood: Neg ery/uL  pH: 7.5 Protein: Neg mg/dL  Glucose: Neg mg/dL Urobilinogen: 0.2 mg/dL    Nitrites: Neg    Leukocyte Esterase: Neg leu/uL    ASSESSMENT:      ICD-10 Details  1 GU:   Prostate Cancer - C61    PLAN:           Schedule Return Visit/Planned Activity: Keep Scheduled Appointment          Document Letter(s):  Created for Patient: Clinical Summary         Notes:   1. Favorable intermediate risk prostate cancer: He confirms his decision to proceed with surgical treatment of his prostate cancer. The patient was counseled about the natural history of prostate cancer and the standard treatment options that are available for prostate cancer. It was explained to him how his age and life expectancy, clinical stage, Gleason  score, and PSA affect his prognosis, the decision to proceed with additional staging studies, as well as how that information influences recommended treatment strategies. We discussed the roles for active surveillance, radiation therapy, surgical therapy, androgen deprivation, as well as ablative therapy options for the treatment of prostate cancer as appropriate to his individual cancer situation. We discussed the risks and benefits of these options with regard to their impact on cancer control and also in terms of potential adverse events, complications, and impact on quality of life particularly related to urinary and sexual function. The patient was encouraged to ask questions throughout the discussion today and  all questions were answered to his stated satisfaction. In addition, the patient was provided with and/or directed to appropriate resources and literature for further education about prostate cancer and treatment options. We discussed surgical therapy for prostate cancer including the different available surgical approaches. We discussed, in detail, the risks and expectations of surgery with regard to cancer control, urinary control, and erectile function as well as the expected postoperative recovery process. Additional risks of surgery including but not limited to bleeding, infection, hernia formation, nerve damage, lymphocele formation, bowel/rectal injury potentially necessitating colostomy, damage to the urinary tract resulting in urine leakage, urethral stricture, and the cardiopulmonary risks such as myocardial infarction, stroke, death, venothromboembolism, etc. were explained. The risk of open surgical conversion for robotic/laparoscopic prostatectomy was also discussed.   All questions were answered to his stated satisfaction. He is prepared to proceed and is scheduled for a bilateral nerve-sparing robot assisted laparoscopic radical prostatectomy and bilateral pelvic lymphadenectomy.   Cc:  Dr. Penni Homans         Next Appointment:      Next Appointment: 10/05/2020 12:00 PM    Appointment Type: 55 Physical Therapy    Location: [facility] [phone]    Provider: Lovenia Kim      * Signed by Raynelle Bring, M.D. on 09/29/20 at 5:20 PM (EDT)*

## 2020-10-15 ENCOUNTER — Encounter (HOSPITAL_COMMUNITY): Payer: Self-pay | Admitting: Urology

## 2020-10-15 ENCOUNTER — Encounter (HOSPITAL_COMMUNITY)
Admission: RE | Disposition: A | Discharge status: Home / Self Care | Payer: Self-pay | Source: Home / Self Care | Attending: Urology

## 2020-10-15 ENCOUNTER — Other Ambulatory Visit: Payer: Self-pay

## 2020-10-15 ENCOUNTER — Ambulatory Visit (HOSPITAL_COMMUNITY): Payer: 59 | Admitting: Anesthesiology

## 2020-10-15 ENCOUNTER — Observation Stay (HOSPITAL_COMMUNITY)
Admission: RE | Admit: 2020-10-15 | Discharge: 2020-10-16 | Disposition: A | Discharge status: Home / Self Care | Payer: 59 | Attending: Urology | Admitting: Urology

## 2020-10-15 DIAGNOSIS — Z87891 Personal history of nicotine dependence: Secondary | ICD-10-CM | POA: Diagnosis not present

## 2020-10-15 DIAGNOSIS — I10 Essential (primary) hypertension: Secondary | ICD-10-CM | POA: Insufficient documentation

## 2020-10-15 DIAGNOSIS — C61 Malignant neoplasm of prostate: Secondary | ICD-10-CM | POA: Diagnosis not present

## 2020-10-15 DIAGNOSIS — Z79899 Other long term (current) drug therapy: Secondary | ICD-10-CM | POA: Insufficient documentation

## 2020-10-15 HISTORY — PX: ROBOT ASSISTED LAPAROSCOPIC RADICAL PROSTATECTOMY: SHX5141

## 2020-10-15 HISTORY — PX: LYMPHADENECTOMY: SHX5960

## 2020-10-15 LAB — TYPE AND SCREEN
ABO/RH(D): A POS
Antibody Screen: NEGATIVE

## 2020-10-15 LAB — HEMOGLOBIN AND HEMATOCRIT, BLOOD
HCT: 45.3 % (ref 39.0–52.0)
Hemoglobin: 15.2 g/dL (ref 13.0–17.0)

## 2020-10-15 LAB — ABO/RH: ABO/RH(D): A POS

## 2020-10-15 SURGERY — XI ROBOTIC ASSISTED LAPAROSCOPIC RADICAL PROSTATECTOMY LEVEL 2
Anesthesia: General

## 2020-10-15 MED ORDER — KETOROLAC TROMETHAMINE 15 MG/ML IJ SOLN
15.0000 mg | Freq: Four times a day (QID) | INTRAMUSCULAR | Status: DC
  Administered 2020-10-15 – 2020-10-16 (×4): 15 mg via INTRAVENOUS
  Filled 2020-10-15 (×4): qty 1

## 2020-10-15 MED ORDER — DOCUSATE SODIUM 100 MG PO CAPS
100.0000 mg | ORAL_CAPSULE | Freq: Two times a day (BID) | ORAL | Status: DC
  Administered 2020-10-15 – 2020-10-16 (×2): 100 mg via ORAL
  Filled 2020-10-15 (×2): qty 1

## 2020-10-15 MED ORDER — HYDROMORPHONE HCL 1 MG/ML IJ SOLN
INTRAMUSCULAR | Status: AC
  Filled 2020-10-15: qty 1

## 2020-10-15 MED ORDER — PROMETHAZINE HCL 25 MG PO TABS
12.5000 mg | ORAL_TABLET | Freq: Once | ORAL | Status: AC
  Administered 2020-10-15: 12.5 mg via ORAL
  Filled 2020-10-15: qty 1

## 2020-10-15 MED ORDER — DIPHENHYDRAMINE HCL 50 MG/ML IJ SOLN: 12.5000 mg | Freq: Four times a day (QID) | INTRAMUSCULAR | Status: DC | PRN

## 2020-10-15 MED ORDER — MORPHINE SULFATE (PF) 2 MG/ML IV SOLN
2.0000 mg | INTRAVENOUS | Status: DC | PRN
  Administered 2020-10-15: 2 mg via INTRAVENOUS
  Filled 2020-10-15: qty 1

## 2020-10-15 MED ORDER — HYDROMORPHONE HCL 1 MG/ML IJ SOLN
INTRAMUSCULAR | Status: DC | PRN
  Administered 2020-10-15: 1 mg via INTRAVENOUS
  Administered 2020-10-15: .5 mg via INTRAVENOUS

## 2020-10-15 MED ORDER — SCOPOLAMINE 1 MG/3DAYS TD PT72
1.0000 | MEDICATED_PATCH | TRANSDERMAL | Status: DC
  Administered 2020-10-15: 1.5 mg via TRANSDERMAL
  Filled 2020-10-15 (×2): qty 1

## 2020-10-15 MED ORDER — LABETALOL HCL 5 MG/ML IV SOLN
INTRAVENOUS | Status: AC
  Filled 2020-10-15: qty 4

## 2020-10-15 MED ORDER — TRAMADOL HCL 50 MG PO TABS
50.0000 mg | ORAL_TABLET | Freq: Four times a day (QID) | ORAL | 0 refills | Status: DC | PRN
Start: 2020-10-15 — End: 2021-11-09

## 2020-10-15 MED ORDER — OXYCODONE HCL 5 MG PO TABS: 5.0000 mg | ORAL_TABLET | Freq: Once | ORAL | Status: DC | PRN

## 2020-10-15 MED ORDER — LABETALOL HCL 5 MG/ML IV SOLN
INTRAVENOUS | Status: DC | PRN
  Administered 2020-10-15 (×2): 5 mg via INTRAVENOUS

## 2020-10-15 MED ORDER — MIDAZOLAM HCL 2 MG/2ML IJ SOLN
INTRAMUSCULAR | Status: AC
  Filled 2020-10-15: qty 2

## 2020-10-15 MED ORDER — ONDANSETRON HCL 4 MG/2ML IJ SOLN
INTRAMUSCULAR | Status: DC | PRN
  Administered 2020-10-15: 4 mg via INTRAVENOUS

## 2020-10-15 MED ORDER — AMLODIPINE BESYLATE 5 MG PO TABS
5.0000 mg | ORAL_TABLET | Freq: Two times a day (BID) | ORAL | Status: DC
  Administered 2020-10-15 – 2020-10-16 (×2): 5 mg via ORAL
  Filled 2020-10-15 (×2): qty 1

## 2020-10-15 MED ORDER — SUCCINYLCHOLINE CHLORIDE 200 MG/10ML IV SOSY
PREFILLED_SYRINGE | INTRAVENOUS | Status: AC
  Filled 2020-10-15: qty 10

## 2020-10-15 MED ORDER — ONDANSETRON HCL 4 MG/2ML IJ SOLN
4.0000 mg | INTRAMUSCULAR | Status: DC | PRN
  Administered 2020-10-15 (×2): 4 mg via INTRAVENOUS
  Filled 2020-10-15 (×3): qty 2

## 2020-10-15 MED ORDER — ORAL CARE MOUTH RINSE: 15.0000 mL | Freq: Once | OROMUCOSAL | Status: AC

## 2020-10-15 MED ORDER — LABETALOL HCL 5 MG/ML IV SOLN
5.0000 mg | INTRAVENOUS | Status: AC | PRN
  Administered 2020-10-15 (×4): 5 mg via INTRAVENOUS

## 2020-10-15 MED ORDER — HYDROMORPHONE HCL 2 MG/ML IJ SOLN
INTRAMUSCULAR | Status: AC
  Filled 2020-10-15: qty 1

## 2020-10-15 MED ORDER — ONDANSETRON HCL 4 MG/2ML IJ SOLN
4.0000 mg | Freq: Once | INTRAMUSCULAR | Status: AC | PRN
  Administered 2020-10-15: 4 mg via INTRAVENOUS

## 2020-10-15 MED ORDER — ACETAMINOPHEN 325 MG PO TABS: 650.0000 mg | ORAL_TABLET | ORAL | Status: DC | PRN

## 2020-10-15 MED ORDER — ALBUTEROL SULFATE HFA 108 (90 BASE) MCG/ACT IN AERS
2.0000 | INHALATION_SPRAY | Freq: Four times a day (QID) | RESPIRATORY_TRACT | Status: DC | PRN
  Filled 2020-10-15: qty 6.7

## 2020-10-15 MED ORDER — PHENYLEPHRINE 40 MCG/ML (10ML) SYRINGE FOR IV PUSH (FOR BLOOD PRESSURE SUPPORT)
PREFILLED_SYRINGE | INTRAVENOUS | Status: AC
  Filled 2020-10-15: qty 10

## 2020-10-15 MED ORDER — ONDANSETRON HCL 4 MG/2ML IJ SOLN
INTRAMUSCULAR | Status: AC
  Filled 2020-10-15: qty 2

## 2020-10-15 MED ORDER — MEPERIDINE HCL 50 MG/ML IJ SOLN: 6.2500 mg | INTRAMUSCULAR | Status: DC | PRN

## 2020-10-15 MED ORDER — ROCURONIUM BROMIDE 10 MG/ML (PF) SYRINGE
PREFILLED_SYRINGE | INTRAVENOUS | Status: DC | PRN
  Administered 2020-10-15: 5 mg via INTRAVENOUS
  Administered 2020-10-15: 15 mg via INTRAVENOUS
  Administered 2020-10-15: 70 mg via INTRAVENOUS

## 2020-10-15 MED ORDER — BUPIVACAINE-EPINEPHRINE 0.5% -1:200000 IJ SOLN
INTRAMUSCULAR | Status: DC | PRN
  Administered 2020-10-15: 40 mL

## 2020-10-15 MED ORDER — BUPIVACAINE-EPINEPHRINE (PF) 0.5% -1:200000 IJ SOLN
INTRAMUSCULAR | Status: AC
  Filled 2020-10-15: qty 30

## 2020-10-15 MED ORDER — TRIAMTERENE-HCTZ 37.5-25 MG PO TABS
1.0000 | ORAL_TABLET | Freq: Every day | ORAL | Status: DC
  Administered 2020-10-16: 1 via ORAL
  Filled 2020-10-15: qty 1

## 2020-10-15 MED ORDER — LACTATED RINGERS IV SOLN: INTRAVENOUS | Status: DC | PRN

## 2020-10-15 MED ORDER — MAGNESIUM CITRATE PO SOLN: 1.0000 | Freq: Once | ORAL | Status: DC

## 2020-10-15 MED ORDER — SUGAMMADEX SODIUM 200 MG/2ML IV SOLN
INTRAVENOUS | Status: DC | PRN
  Administered 2020-10-15: 400 mg via INTRAVENOUS

## 2020-10-15 MED ORDER — BUPIVACAINE HCL 0.25 % IJ SOLN
INTRAMUSCULAR | Status: AC
  Filled 2020-10-15: qty 1

## 2020-10-15 MED ORDER — DEXAMETHASONE SODIUM PHOSPHATE 10 MG/ML IJ SOLN
INTRAMUSCULAR | Status: DC | PRN
  Administered 2020-10-15: 10 mg via INTRAVENOUS

## 2020-10-15 MED ORDER — HYDROMORPHONE HCL 1 MG/ML IJ SOLN
0.2500 mg | INTRAMUSCULAR | Status: DC | PRN
  Administered 2020-10-15 (×2): 0.5 mg via INTRAVENOUS

## 2020-10-15 MED ORDER — LOSARTAN POTASSIUM 50 MG PO TABS
100.0000 mg | ORAL_TABLET | Freq: Every day | ORAL | Status: DC
  Administered 2020-10-15: 100 mg via ORAL
  Filled 2020-10-15: qty 2

## 2020-10-15 MED ORDER — OXYCODONE HCL 5 MG/5ML PO SOLN: 5.0000 mg | Freq: Once | ORAL | Status: DC | PRN

## 2020-10-15 MED ORDER — CHLORHEXIDINE GLUCONATE CLOTH 2 % EX PADS
6.0000 | MEDICATED_PAD | Freq: Every day | CUTANEOUS | Status: DC
  Administered 2020-10-16: 6 via TOPICAL

## 2020-10-15 MED ORDER — SODIUM CHLORIDE 0.9 % IR SOLN
Status: DC | PRN
  Administered 2020-10-15: 1000 mL

## 2020-10-15 MED ORDER — ATORVASTATIN CALCIUM 20 MG PO TABS
20.0000 mg | ORAL_TABLET | Freq: Every day | ORAL | Status: DC
  Administered 2020-10-16: 20 mg via ORAL
  Filled 2020-10-15: qty 1

## 2020-10-15 MED ORDER — CEFAZOLIN SODIUM-DEXTROSE 1-4 GM/50ML-% IV SOLN
1.0000 g | Freq: Three times a day (TID) | INTRAVENOUS | Status: AC
  Administered 2020-10-15 – 2020-10-16 (×2): 1 g via INTRAVENOUS
  Filled 2020-10-15 (×2): qty 50

## 2020-10-15 MED ORDER — HEPARIN SODIUM (PORCINE) 1000 UNIT/ML IJ SOLN
INTRAMUSCULAR | Status: AC
  Filled 2020-10-15: qty 1

## 2020-10-15 MED ORDER — LORATADINE 10 MG PO TABS
10.0000 mg | ORAL_TABLET | Freq: Every day | ORAL | Status: DC
  Administered 2020-10-16: 10 mg via ORAL
  Filled 2020-10-15: qty 1

## 2020-10-15 MED ORDER — PHENYLEPHRINE 40 MCG/ML (10ML) SYRINGE FOR IV PUSH (FOR BLOOD PRESSURE SUPPORT)
PREFILLED_SYRINGE | INTRAVENOUS | Status: DC | PRN
  Administered 2020-10-15 (×3): 40 ug via INTRAVENOUS

## 2020-10-15 MED ORDER — MIDAZOLAM HCL 5 MG/5ML IJ SOLN
INTRAMUSCULAR | Status: DC | PRN
  Administered 2020-10-15: 2 mg via INTRAVENOUS

## 2020-10-15 MED ORDER — BACITRACIN-NEOMYCIN-POLYMYXIN 400-5-5000 EX OINT
1.0000 "application " | TOPICAL_OINTMENT | Freq: Three times a day (TID) | CUTANEOUS | Status: DC | PRN
  Administered 2020-10-16: 1 via TOPICAL
  Filled 2020-10-15: qty 1

## 2020-10-15 MED ORDER — FENTANYL CITRATE (PF) 100 MCG/2ML IJ SOLN
INTRAMUSCULAR | Status: DC | PRN
  Administered 2020-10-15: 50 ug via INTRAVENOUS
  Administered 2020-10-15: 100 ug via INTRAVENOUS
  Administered 2020-10-15: 50 ug via INTRAVENOUS

## 2020-10-15 MED ORDER — HYDRALAZINE HCL 20 MG/ML IJ SOLN
10.0000 mg | Freq: Once | INTRAMUSCULAR | Status: AC
  Administered 2020-10-15: 10 mg via INTRAVENOUS

## 2020-10-15 MED ORDER — DIPHENHYDRAMINE HCL 12.5 MG/5ML PO ELIX: 12.5000 mg | ORAL_SOLUTION | Freq: Four times a day (QID) | ORAL | Status: DC | PRN

## 2020-10-15 MED ORDER — CEFAZOLIN SODIUM-DEXTROSE 2-4 GM/100ML-% IV SOLN
INTRAVENOUS | Status: AC
  Filled 2020-10-15: qty 100

## 2020-10-15 MED ORDER — KCL IN DEXTROSE-NACL 20-5-0.45 MEQ/L-%-% IV SOLN
INTRAVENOUS | Status: DC
  Filled 2020-10-15 (×4): qty 1000

## 2020-10-15 MED ORDER — PROPOFOL 10 MG/ML IV BOLUS
INTRAVENOUS | Status: AC
  Filled 2020-10-15: qty 20

## 2020-10-15 MED ORDER — LIDOCAINE 2% (20 MG/ML) 5 ML SYRINGE
INTRAMUSCULAR | Status: DC | PRN
  Administered 2020-10-15: 100 mg via INTRAVENOUS

## 2020-10-15 MED ORDER — ZOLPIDEM TARTRATE 5 MG PO TABS: 5.0000 mg | ORAL_TABLET | Freq: Every evening | ORAL | Status: DC | PRN

## 2020-10-15 MED ORDER — HYDRALAZINE HCL 20 MG/ML IJ SOLN: 5.0000 mg | Freq: Four times a day (QID) | INTRAMUSCULAR | Status: DC | PRN

## 2020-10-15 MED ORDER — SODIUM CHLORIDE 0.9 % IV BOLUS
1000.0000 mL | Freq: Once | INTRAVENOUS | Status: AC
  Administered 2020-10-15: 1000 mL via INTRAVENOUS

## 2020-10-15 MED ORDER — HYDRALAZINE HCL 20 MG/ML IJ SOLN
INTRAMUSCULAR | Status: AC
  Filled 2020-10-15: qty 1

## 2020-10-15 MED ORDER — SULFAMETHOXAZOLE-TRIMETHOPRIM 800-160 MG PO TABS: 1.0000 | ORAL_TABLET | Freq: Two times a day (BID) | ORAL | 0 refills | Status: DC

## 2020-10-15 MED ORDER — BELLADONNA ALKALOIDS-OPIUM 16.2-60 MG RE SUPP: 1.0000 | Freq: Four times a day (QID) | RECTAL | Status: DC | PRN

## 2020-10-15 MED ORDER — LACTATED RINGERS IV SOLN: INTRAVENOUS | Status: DC

## 2020-10-15 MED ORDER — FENTANYL CITRATE (PF) 250 MCG/5ML IJ SOLN
INTRAMUSCULAR | Status: AC
  Filled 2020-10-15: qty 5

## 2020-10-15 MED ORDER — PROPOFOL 10 MG/ML IV BOLUS
INTRAVENOUS | Status: DC | PRN
  Administered 2020-10-15: 200 mg via INTRAVENOUS

## 2020-10-15 MED ORDER — FLEET ENEMA 7-19 GM/118ML RE ENEM: 1.0000 | ENEMA | Freq: Once | RECTAL | Status: DC

## 2020-10-15 MED ORDER — CHLORHEXIDINE GLUCONATE 0.12 % MT SOLN
15.0000 mL | Freq: Once | OROMUCOSAL | Status: AC
  Administered 2020-10-15: 15 mL via OROMUCOSAL

## 2020-10-15 MED ORDER — CEFAZOLIN SODIUM-DEXTROSE 2-4 GM/100ML-% IV SOLN
2.0000 g | Freq: Once | INTRAVENOUS | Status: AC
  Administered 2020-10-15: 2 g via INTRAVENOUS

## 2020-10-15 SURGICAL SUPPLY — 63 items
ADH SKN CLS APL DERMABOND .7 (GAUZE/BANDAGES/DRESSINGS) ×2
APL PRP STRL LF DISP 70% ISPRP (MISCELLANEOUS) ×2
APL SWBSTK 6 STRL LF DISP (MISCELLANEOUS) ×2
APPLICATOR COTTON TIP 6 STRL (MISCELLANEOUS) ×2 IMPLANT
APPLICATOR COTTON TIP 6IN STRL (MISCELLANEOUS) ×3
CATH FOLEY 2WAY SLVR 18FR 30CC (CATHETERS) ×3 IMPLANT
CATH ROBINSON RED A/P 16FR (CATHETERS) ×3 IMPLANT
CATH ROBINSON RED A/P 8FR (CATHETERS) ×3 IMPLANT
CATH TIEMANN FOLEY 18FR 5CC (CATHETERS) ×3 IMPLANT
CHLORAPREP W/TINT 26 (MISCELLANEOUS) ×3 IMPLANT
CLIP VESOLOCK LG 6/CT PURPLE (CLIP) ×7 IMPLANT
COVER SURGICAL LIGHT HANDLE (MISCELLANEOUS) ×3 IMPLANT
COVER TIP SHEARS 8 DVNC (MISCELLANEOUS) ×2 IMPLANT
COVER TIP SHEARS 8MM DA VINCI (MISCELLANEOUS) ×3
COVER WAND RF STERILE (DRAPES) IMPLANT
CUTTER ECHEON FLEX ENDO 45 340 (ENDOMECHANICALS) ×3 IMPLANT
DECANTER SPIKE VIAL GLASS SM (MISCELLANEOUS) ×3 IMPLANT
DERMABOND ADVANCED (GAUZE/BANDAGES/DRESSINGS) ×1
DERMABOND ADVANCED .7 DNX12 (GAUZE/BANDAGES/DRESSINGS) ×2 IMPLANT
DRAIN CHANNEL RND F F (WOUND CARE) ×2 IMPLANT
DRAPE ARM DVNC X/XI (DISPOSABLE) ×8 IMPLANT
DRAPE COLUMN DVNC XI (DISPOSABLE) ×2 IMPLANT
DRAPE DA VINCI XI ARM (DISPOSABLE) ×12
DRAPE DA VINCI XI COLUMN (DISPOSABLE) ×3
DRAPE SURG IRRIG POUCH 19X23 (DRAPES) ×3 IMPLANT
DRSG TEGADERM 4X4.75 (GAUZE/BANDAGES/DRESSINGS) ×3 IMPLANT
ELECT PENCIL ROCKER SW 15FT (MISCELLANEOUS) ×1 IMPLANT
ELECT REM PT RETURN 15FT ADLT (MISCELLANEOUS) ×3 IMPLANT
GLOVE BIO SURGEON STRL SZ 6.5 (GLOVE) ×3 IMPLANT
GLOVE BIOGEL M STRL SZ7.5 (GLOVE) ×6 IMPLANT
GOWN STRL REUS W/TWL LRG LVL3 (GOWN DISPOSABLE) ×9 IMPLANT
HEMOSTAT SURGICEL 4X8 (HEMOSTASIS) ×1 IMPLANT
HOLDER FOLEY CATH W/STRAP (MISCELLANEOUS) ×3 IMPLANT
IRRIG SUCT STRYKERFLOW 2 WTIP (MISCELLANEOUS) ×3
IRRIGATION SUCT STRKRFLW 2 WTP (MISCELLANEOUS) ×2 IMPLANT
IV LACTATED RINGERS 1000ML (IV SOLUTION) ×3 IMPLANT
KIT TURNOVER KIT A (KITS) IMPLANT
NDL SAFETY ECLIPSE 18X1.5 (NEEDLE) ×2 IMPLANT
NEEDLE HYPO 18GX1.5 SHARP (NEEDLE) ×3
PACK ROBOT UROLOGY CUSTOM (CUSTOM PROCEDURE TRAY) ×3 IMPLANT
RELOAD STAPLE 45 4.1 GRN THCK (STAPLE) ×2 IMPLANT
SEAL CANN UNIV 5-8 DVNC XI (MISCELLANEOUS) ×8 IMPLANT
SEAL XI 5MM-8MM UNIVERSAL (MISCELLANEOUS) ×12
SET IRRIG Y TYPE TUR BLADDER L (SET/KITS/TRAYS/PACK) ×3 IMPLANT
SET TUBE SMOKE EVAC HIGH FLOW (TUBING) ×3 IMPLANT
SOLUTION ELECTROLUBE (MISCELLANEOUS) ×3 IMPLANT
STAPLE RELOAD 45 GRN (STAPLE) ×2 IMPLANT
STAPLE RELOAD 45MM GREEN (STAPLE) ×3
SUT ETHILON 3 0 PS 1 (SUTURE) ×3 IMPLANT
SUT MNCRL 3 0 RB1 (SUTURE) ×2 IMPLANT
SUT MNCRL 3 0 VIOLET RB1 (SUTURE) ×2 IMPLANT
SUT MNCRL AB 4-0 PS2 18 (SUTURE) ×6 IMPLANT
SUT MONOCRYL 3 0 RB1 (SUTURE) ×6
SUT VIC AB 0 CT1 27 (SUTURE) ×3
SUT VIC AB 0 CT1 27XBRD ANTBC (SUTURE) ×2 IMPLANT
SUT VIC AB 0 UR5 27 (SUTURE) ×3 IMPLANT
SUT VIC AB 2-0 SH 27 (SUTURE) ×3
SUT VIC AB 2-0 SH 27X BRD (SUTURE) ×2 IMPLANT
SUT VICRYL 0 UR6 27IN ABS (SUTURE) ×6 IMPLANT
SYR 27GX1/2 1ML LL SAFETY (SYRINGE) ×3 IMPLANT
TOWEL OR NON WOVEN STRL DISP B (DISPOSABLE) ×3 IMPLANT
TROCAR XCEL NON-BLD 5MMX100MML (ENDOMECHANICALS) IMPLANT
WATER STERILE IRR 1000ML POUR (IV SOLUTION) ×3 IMPLANT

## 2020-10-15 NOTE — Anesthesia Postprocedure Evaluation (Signed)
Anesthesia Post Note  Patient: Joseph Fowler  Procedure(s) Performed: XI ROBOTIC ASSISTED LAPAROSCOPIC RADICAL PROSTATECTOMY LEVEL 2 (N/A ) LYMPHADENECTOMY, PELVIC (Bilateral )     Patient location during evaluation: PACU Anesthesia Type: General Level of consciousness: awake and alert Pain management: pain level controlled Vital Signs Assessment: post-procedure vital signs reviewed and stable Respiratory status: spontaneous breathing, nonlabored ventilation and respiratory function stable Cardiovascular status: blood pressure returned to baseline and stable Postop Assessment: no apparent nausea or vomiting Anesthetic complications: no Comments: Labetalol given IV in PACU for HTN.   No complications documented.  Last Vitals:  Vitals:   10/15/20 1525 10/15/20 1530  BP: (!) 166/99 (!) 163/99  Pulse: 66 68  Resp: 14 12  Temp:    SpO2: 94% 94%    Last Pain:  Vitals:   10/15/20 1515  TempSrc:   PainSc: 0-No pain                 Sabiha Sura A.

## 2020-10-15 NOTE — Op Note (Addendum)
Preoperative diagnosis: Clinically localized adenocarcinoma of the prostate (clinical stage T1c Nx Mx)  Postoperative diagnosis: Clinically localized adenocarcinoma of the prostate (clinical stage T1c Nx Mx)  Procedure:  1. Robotic assisted laparoscopic radical prostatectomy (bilateral nerve sparing) 2. Bilateral robotic assisted laparoscopic pelvic lymphadenectomy  Surgeon: Pryor Curia. M.D.  Assistant: Debbrah Alar, PA-C  An assistant was required for this surgical procedure.  The duties of the assistant included but were not limited to suctioning, passing suture, camera manipulation, retraction. This procedure would not be able to be performed without an Environmental consultant.  Anesthesia: General  Complications: None  EBL: 125 mL  IVF:  1000 mL crystalloid  Specimens: 1. Prostate and seminal vesicles 2. Left posterolateral margin 3. Right pelvic lymph nodes 4. Left pelvic lymph nodes  Disposition of specimens: Pathology  Drains: 1. 20 Fr coude catheter 2. # 19 Blake pelvic drain  Indication: Joseph Fowler is a 64 y.o. year old patient with clinically localized prostate cancer.  After a thorough review of the management options for treatment of prostate cancer, he elected to proceed with surgical therapy and the above procedure(s).  We have discussed the potential benefits and risks of the procedure, side effects of the proposed treatment, the likelihood of the patient achieving the goals of the procedure, and any potential problems that might occur during the procedure or recuperation. Informed consent has been obtained.  Description of procedure:  The patient was taken to the operating room and a general anesthetic was administered. He was given preoperative antibiotics, placed in the dorsal lithotomy position, and prepped and draped in the usual sterile fashion. Next a preoperative timeout was performed. A urethral catheter was placed into the bladder and a site was  selected near the umbilicus for placement of the camera port. This was placed using a standard open Hassan technique which allowed entry into the peritoneal cavity under direct vision and without difficulty. An 8 mm robotic port was placed and a pneumoperitoneum established. The camera was then used to inspect the abdomen and there was no evidence of any intra-abdominal injuries or other abnormalities. The remaining abdominal ports were then placed. 8 mm robotic ports were placed in the right lower quadrant, left lower quadrant, and far left lateral abdominal wall. A 5 mm port was placed in the right upper quadrant and a 12 mm port was placed in the right lateral abdominal wall for laparoscopic assistance. All ports were placed under direct vision without difficulty. The surgical cart was then docked.   Utilizing the cautery scissors, the bladder was reflected posteriorly allowing entry into the space of Retzius and identification of the endopelvic fascia and prostate. The periprostatic fat was then removed from the prostate allowing full exposure of the endopelvic fascia. The endopelvic fascia was then incised from the apex back to the base of the prostate bilaterally and the underlying levator muscle fibers were swept laterally off the prostate thereby isolating the dorsal venous complex. The dorsal vein was then stapled and divided with a 45 mm Flex Echelon stapler. Attention then turned to the bladder neck which was divided anteriorly thereby allowing entry into the bladder and exposure of the urethral catheter. The catheter balloon was deflated and the catheter was brought into the operative field and used to retract the prostate anteriorly. The posterior bladder neck was then examined and was divided allowing further dissection between the bladder and prostate posteriorly until the vasa deferentia and seminal vessels were identified. The vasa deferentia were isolated,  divided, and lifted anteriorly. The  seminal vesicles were dissected down to their tips with care to control the seminal vascular arterial blood supply. These structures were then lifted anteriorly and the space between Denonvillier's fascia and the anterior rectum was developed with a combination of sharp and blunt dissection. This isolated the vascular pedicles of the prostate.  The lateral prostatic fascia was then sharply incised allowing release of the neurovascular bundles bilaterally.  There was noted to be an incision into the left lateral prostate during this dissection and a separate margin from this area was obtained. The vascular pedicles of the prostate were then ligated with Weck clips between the prostate and neurovascular bundles and divided with sharp cold scissor dissection resulting in neurovascular bundle preservation. The neurovascular bundles were then separated off the apex of the prostate and urethra bilaterally.  The urethra was then sharply transected allowing the prostate specimen to be disarticulated. The pelvis was copiously irrigated and hemostasis was ensured. There was no evidence for rectal injury.  Attention then turned to the right pelvic sidewall. The fibrofatty tissue between the external iliac vein, confluence of the iliac vessels, hypogastric artery, and Cooper's ligament was dissected free from the pelvic sidewall with care to preserve the obturator nerve. Weck clips were used for lymphostasis and hemostasis. An identical procedure was performed on the contralateral side and the lymphatic packets were removed for permanent pathologic analysis.  Attention then turned to the urethral anastomosis. A 2-0 Vicryl slip knot was placed between Denonvillier's fascia, the posterior bladder neck, and the posterior urethra to reapproximate these structures. A double-armed 3-0 Monocryl suture was then used to perform a 360 running tension-free anastomosis between the bladder neck and urethra. A new urethral catheter  was then placed into the bladder and irrigated. There were no blood clots within the bladder and the anastomosis appeared to be watertight. A #19 Blake drain was then brought through the left lateral 8 mm port site and positioned appropriately within the pelvis. It was secured to the skin with a nylon suture. The surgical cart was then undocked. The right lateral 12 mm port site was closed at the fascial level with a 0 Vicryl suture placed laparoscopically. All remaining ports were then removed under direct vision. The prostate specimen was removed intact within the Endopouch retrieval bag via the periumbilical camera port site. This fascial opening was closed with two running 0 Vicryl sutures. 0.25% Marcaine was then injected into all port sites and all incisions were reapproximated at the skin level with 4-0 Monocryl subcuticular sutures and Dermabond. The patient appeared to tolerate the procedure well and without complications. The patient was able to be extubated and transferred to the recovery unit in satisfactory condition.   Pryor Curia MD

## 2020-10-15 NOTE — Anesthesia Preprocedure Evaluation (Addendum)
Anesthesia Evaluation  Patient identified by MRN, date of birth, ID band Patient awake    Reviewed: Allergy & Precautions, NPO status , Patient's Chart, lab work & pertinent test results, reviewed documented beta blocker date and time   History of Anesthesia Complications (+) PONV and history of anesthetic complications  Airway Mallampati: II  TM Distance: >3 FB Neck ROM: Full    Dental no notable dental hx. (+) Teeth Intact   Pulmonary former smoker,    Pulmonary exam normal breath sounds clear to auscultation       Cardiovascular hypertension, Pt. on medications Normal cardiovascular exam Rhythm:Regular Rate:Normal     Neuro/Psych negative neurological ROS  negative psych ROS   GI/Hepatic negative GI ROS, Neg liver ROS,   Endo/Other  Hyperlipidemia  Renal/GU negative Renal ROS   Prostate Ca ED    Musculoskeletal  (+) Arthritis , Osteoarthritis,    Abdominal (+) + obese,   Peds  Hematology negative hematology ROS (+)   Anesthesia Other Findings   Reproductive/Obstetrics                            Anesthesia Physical Anesthesia Plan  ASA: II  Anesthesia Plan: General   Post-op Pain Management:    Induction: Intravenous  PONV Risk Score and Plan: 4 or greater and Scopolamine patch - Pre-op, Ondansetron, Treatment may vary due to age or medical condition, Dexamethasone and Midazolam  Airway Management Planned: Oral ETT  Additional Equipment: None  Intra-op Plan:   Post-operative Plan: Extubation in OR  Informed Consent: I have reviewed the patients History and Physical, chart, labs and discussed the procedure including the risks, benefits and alternatives for the proposed anesthesia with the patient or authorized representative who has indicated his/her understanding and acceptance.     Dental advisory given  Plan Discussed with: CRNA and Anesthesiologist  Anesthesia  Plan Comments:        Anesthesia Quick Evaluation

## 2020-10-15 NOTE — Progress Notes (Signed)
Patient ID: Joseph Fowler, male   DOB: 1956/09/26, 64 y.o.   MRN: [id number]  Post-op note  Subjective: The patient is doing well.  No complaints.  Objective: Vital signs in last 24 hours: Temp:  [97.6 F (36.4 C)-98.2 F (36.8 C)] 97.6 F (36.4 C) (10/21 1440) Pulse Rate:  [66-74] 68 (10/21 1530) Resp:  [10-18] 12 (10/21 1530) BP: (140-176)/(97-106) 163/99 (10/21 1530) SpO2:  [92 %-99 %] 94 % (10/21 1530) Weight:  [108.1 kg] 108.1 kg (10/21 1015)  Intake/Output from previous day: No intake/output data recorded. Intake/Output this shift: Total I/O In: 900 [I.V.:900] Out: 125 [Blood:125]  Physical Exam:  General: Alert and oriented. Abdomen: Soft, Nondistended. Incisions: Clean and dry. GU: Urine clearing.  Lab Results: Recent Labs    10/15/20 1516  HGB 15.2  HCT 45.3    Assessment/Plan: POD#0   1) Continue to monitor, ambulate, IS   Joseph Fowler. MD   LOS: 0 days   Joseph Fowler 10/15/2020, 3:38 PM

## 2020-10-15 NOTE — Discharge Instructions (Signed)

## 2020-10-15 NOTE — Anesthesia Procedure Notes (Signed)
Procedure Name: Intubation Date/Time: 10/15/2020 12:06 PM Performed by: Lavina Hamman, CRNA Pre-anesthesia Checklist: Patient identified, Emergency Drugs available, Suction available, Patient being monitored and Timeout performed Patient Re-evaluated:Patient Re-evaluated prior to induction Oxygen Delivery Method: Circle system utilized Preoxygenation: Pre-oxygenation with 100% oxygen Induction Type: IV induction Ventilation: Mask ventilation with difficulty, Two handed mask ventilation required and Oral airway inserted - appropriate to patient size Laryngoscope Size: Mac and 4 Grade View: Grade II Tube type: Oral Tube size: 7.5 mm Number of attempts: 1 Airway Equipment and Method: Stylet Placement Confirmation: ETT inserted through vocal cords under direct vision,  positive ETCO2,  CO2 detector and breath sounds checked- equal and bilateral Secured at: 23 cm Tube secured with: Tape Dental Injury: Teeth and Oropharynx as per pre-operative assessment  Comments: ATOI, beard made it difficult to seal and mask.

## 2020-10-15 NOTE — Interval H&P Note (Signed)
History and Physical Interval Note:  10/15/2020 10:24 AM  Joseph Fowler  has presented today for surgery, with the diagnosis of PROSTATE CANCER.  The various methods of treatment have been discussed with the patient and family. After consideration of risks, benefits and other options for treatment, the patient has consented to  Procedure(s): XI ROBOTIC ASSISTED LAPAROSCOPIC RADICAL PROSTATECTOMY LEVEL 2 (N/A) LYMPHADENECTOMY, PELVIC (Bilateral) as a surgical intervention.  The patient's history has been reviewed, patient examined, no change in status, stable for surgery.  I have reviewed the patient's chart and labs.  Questions were answered to the patient's satisfaction.     [employer]

## 2020-10-15 NOTE — Transfer of Care (Signed)
Immediate Anesthesia Transfer of Care Note  Patient: Joseph Fowler  Procedure(s) Performed: Procedure(s): XI ROBOTIC ASSISTED LAPAROSCOPIC RADICAL PROSTATECTOMY LEVEL 2 (N/A) LYMPHADENECTOMY, PELVIC (Bilateral)  Patient Location: PACU  Anesthesia Type:General  Level of Consciousness: Alert, Awake, Oriented  Airway & Oxygen Therapy: Patient Spontanous Breathing  Post-op Assessment: Report given to RN  Post vital signs: Reviewed and stable  Last Vitals:  Vitals:   10/15/20 1049  BP: (!) 140/101  Pulse: 68  Resp: 18  Temp: 36.8 C  SpO2: 14%    Complications: No apparent anesthesia complications

## 2020-10-16 ENCOUNTER — Encounter (HOSPITAL_COMMUNITY): Payer: Self-pay | Admitting: Urology

## 2020-10-16 DIAGNOSIS — C61 Malignant neoplasm of prostate: Secondary | ICD-10-CM | POA: Diagnosis not present

## 2020-10-16 LAB — HEMOGLOBIN AND HEMATOCRIT, BLOOD
HCT: 37.8 % — ABNORMAL LOW (ref 39.0–52.0)
Hemoglobin: 13 g/dL (ref 13.0–17.0)

## 2020-10-16 MED ORDER — TRAMADOL HCL 50 MG PO TABS
50.0000 mg | ORAL_TABLET | Freq: Four times a day (QID) | ORAL | Status: DC | PRN
  Administered 2020-10-16: 100 mg via ORAL
  Filled 2020-10-16: qty 2

## 2020-10-16 MED ORDER — BISACODYL 10 MG RE SUPP
10.0000 mg | Freq: Once | RECTAL | Status: AC
  Administered 2020-10-16: 10 mg via RECTAL
  Filled 2020-10-16: qty 1

## 2020-10-16 NOTE — Progress Notes (Signed)
Patient discharged home with wife, discharge instructions given and explained to patient/wife, reviewed foley care and incision assessment at home with patient/wife as well, they verbalized understanding.Surgical incision clean/dry/intact, no sigh of infection noted. Patient denies any pain/distress, accompanied home by wife, transported to the car by staff.

## 2020-10-16 NOTE — Discharge Summary (Signed)
Date of admission: 10/15/2020  Date of discharge: 10/16/2020  Admission diagnosis: Prostate Cancer  Discharge diagnosis: Prostate Cancer  History and Physical: For full details, please see admission history and physical. Briefly, Joseph Fowler is a 64 y.o. gentleman with localized prostate cancer.  After discussing management/treatment options, he elected to proceed with surgical treatment.  Hospital Course: Joseph Fowler was taken to the operating room on 10/15/2020 and underwent a robotic assisted laparoscopic radical prostatectomy. He tolerated this procedure well and without complications. Postoperatively, he was able to be transferred to a regular hospital room following recovery from anesthesia.  He was able to begin ambulating the night of surgery. He remained hemodynamically stable overnight.  He had excellent urine output with appropriately minimal output from his pelvic drain and his pelvic drain was removed on POD #1.  He was transitioned to oral pain medication, tolerated a clear liquid diet, and had met all discharge criteria and was able to be discharged home later on POD#1.  Laboratory values: Recent Labs    10/15/20 1516 10/16/20 0443  HGB 15.2 13.0  HCT 45.3 37.8*    Disposition: Home  Discharge instruction: He was instructed to be ambulatory but to refrain from heavy lifting, strenuous activity, or driving. He was instructed on urethral catheter care.  Discharge medications:   Allergies as of 10/16/2020   No Known Allergies     Medication List    STOP taking these medications   ascorbic acid 500 MG tablet Commonly known as: VITAMIN C   cholecalciferol 25 MCG (1000 UNIT) tablet Commonly known as: VITAMIN D3   CRANBERRY CONCENTRATE PO   GREEN TEA EXTRACT PO   GREEN TEA PO   multivitamin tablet   multivitamin with minerals Tabs tablet   tamsulosin 0.4 MG Caps capsule Commonly known as: FLOMAX   vitamin B-12 100 MCG tablet Commonly known as:  CYANOCOBALAMIN   VITAMIN B-12 CR PO   vitamin C with rose hips 500 MG tablet   VITAMIN D (ERGOCALCIFEROL) PO     TAKE these medications   albuterol 108 (90 Base) MCG/ACT inhaler Commonly known as: VENTOLIN HFA Inhale 2 puffs into the lungs every 6 (six) hours as needed for wheezing or shortness of breath.   amLODipine 5 MG tablet Commonly known as: NORVASC Take 1 tablet (5 mg total) by mouth 2 (two) times daily.   atorvastatin 20 MG tablet Commonly known as: LIPITOR TAKE 1 TABLET BY MOUTH ONCE DAILY APPOINTMENT  REQUIRED  FOR  FUTURE  REFILLS What changed: See the new instructions.   loratadine 10 MG tablet Commonly known as: CLARITIN Take 1 tablet (10 mg total) by mouth daily.   losartan 100 MG tablet Commonly known as: COZAAR Take 1 tablet (100 mg total) by mouth at bedtime.   Miconazole Nitrate 2 % Lotn Apply to lower legs daily after shower for 30 days.   Nasacort Allergy 24HR 55 MCG/ACT Aero nasal inhaler Generic drug: triamcinolone Place 1 spray into the nose daily as needed (Allergy).   sildenafil 100 MG tablet Commonly known as: VIAGRA TAKE 1 TABLET BY MOUTH ONCE DAILY AS NEEDED FOR ERECTILE DYSFUNCTION What changed: See the new instructions.   sulfamethoxazole-trimethoprim 800-160 MG tablet Commonly known as: BACTRIM DS Take 1 tablet by mouth 2 (two) times daily. Start the day prior to foley removal appointment   traMADol 50 MG tablet Commonly known as: Ultram Take 1-2 tablets (50-100 mg total) by mouth every 6 (six) hours as needed for moderate pain  or severe pain.   triamterene-hydrochlorothiazide 37.5-25 MG tablet Commonly known as: MAXZIDE-25 Take 1 tablet by mouth daily.       Followup: He will followup in 1 week for catheter removal and to discuss his surgical pathology results.

## 2020-10-16 NOTE — Progress Notes (Signed)
-   Supervised to ambulate on the hall twice, pt tolerated well.

## 2020-10-16 NOTE — Plan of Care (Signed)
  Problem: Education: Goal: Knowledge of General Education information will improve Description: Including pain rating scale, medication(s)/side effects and non-pharmacologic comfort measures Outcome: Progressing   Problem: Health Behavior/Discharge Planning: Goal: Ability to manage health-related needs will improve Outcome: Progressing   Problem: Clinical Measurements: Goal: Ability to maintain clinical measurements within normal limits will improve Outcome: Progressing Goal: Will remain free from infection Outcome: Progressing Goal: Diagnostic test results will improve Outcome: Progressing Goal: Respiratory complications will improve Outcome: Progressing Goal: Cardiovascular complication will be avoided Outcome: Progressing   Problem: Activity: Goal: Risk for activity intolerance will decrease Outcome: Progressing   Problem: Nutrition: Goal: Adequate nutrition will be maintained Outcome: Progressing   Problem: Coping: Goal: Level of anxiety will decrease Outcome: Progressing   Problem: Elimination: Goal: Will not experience complications related to bowel motility Outcome: Progressing Goal: Will not experience complications related to urinary retention Outcome: Progressing   Problem: Pain Managment: Goal: General experience of comfort will improve Outcome: Progressing   Problem: Safety: Goal: Ability to remain free from injury will improve Outcome: Progressing   Problem: Skin Integrity: Goal: Risk for impaired skin integrity will decrease Outcome: Progressing   Problem: Education: Goal: Required Educational Video(s) Outcome: Progressing   Problem: Clinical Measurements: Goal: Postoperative complications will be avoided or minimized Outcome: Progressing   Problem: Skin Integrity: Goal: Demonstration of wound healing without infection will improve Outcome: Progressing  2330- Nausea and dizziness resolved after several doses of Prn meds, able to ambulate to  the hallway using walker with supervision.

## 2020-10-16 NOTE — Progress Notes (Signed)
Patient ID: Joseph Fowler, male   DOB: 11/12/1956, 64 y.o.   MRN: [id number]  1 Day Post-Op Subjective: The patient is doing well.  No nausea or vomiting. Pain is adequately controlled.  Objective: Vital signs in last 24 hours: Temp:  [97.6 F (36.4 C)-98.4 F (36.9 C)] 98 F (36.7 C) (10/22 0512) Pulse Rate:  [66-102] 94 (10/22 0512) Resp:  [10-20] 17 (10/22 0512) BP: (120-176)/(75-106) 120/76 (10/22 0512) SpO2:  [91 %-99 %] 95 % (10/22 0512) Weight:  [108.1 kg-110.3 kg] 110.3 kg (10/21 1714)  Intake/Output from previous day: 10/21 0701 - 10/22 0700 In: 2205.4 [P.O.:240; I.V.:915.4; IV Piggyback:1050] Out: 1886 [Urine:2150; Drains:190; Blood:125] Intake/Output this shift: No intake/output data recorded.  Physical Exam:  General: Alert and oriented. CV: RRR Lungs: Clear bilaterally. GI: Soft, Nondistended. Incisions: Clean, dry, and intact Urine: Clear Extremities: Nontender, no erythema, no edema.  Lab Results: Recent Labs    10/15/20 1516 10/16/20 0443  HGB 15.2 13.0  HCT 45.3 37.8*      Assessment/Plan: POD# 1 s/p robotic prostatectomy.  1) SL IVF 2) Ambulate, Incentive spirometry 3) Transition to oral pain medication 4) Dulcolax suppository 5) D/C pelvic drain 6) Plan for likely discharge later today   Pryor Curia. MD   LOS: 0 days   Dutch Gray 10/16/2020, 7:32 AM

## 2020-10-21 LAB — SURGICAL PATHOLOGY

## 2020-11-09 ENCOUNTER — Other Ambulatory Visit: Payer: Self-pay | Admitting: Family Medicine

## 2020-11-09 DIAGNOSIS — I10 Essential (primary) hypertension: Secondary | ICD-10-CM

## 2020-12-06 ENCOUNTER — Other Ambulatory Visit: Payer: Self-pay | Admitting: Family Medicine

## 2021-01-15 ENCOUNTER — Other Ambulatory Visit: Payer: Self-pay | Admitting: Family Medicine

## 2021-01-15 DIAGNOSIS — R739 Hyperglycemia, unspecified: Secondary | ICD-10-CM

## 2021-01-15 DIAGNOSIS — N529 Male erectile dysfunction, unspecified: Secondary | ICD-10-CM

## 2021-01-15 DIAGNOSIS — N649 Disorder of breast, unspecified: Secondary | ICD-10-CM

## 2021-01-15 DIAGNOSIS — E663 Overweight: Secondary | ICD-10-CM

## 2021-01-15 DIAGNOSIS — R0683 Snoring: Secondary | ICD-10-CM

## 2021-01-15 DIAGNOSIS — R5383 Other fatigue: Secondary | ICD-10-CM

## 2021-01-15 DIAGNOSIS — I10 Essential (primary) hypertension: Secondary | ICD-10-CM

## 2021-02-15 ENCOUNTER — Other Ambulatory Visit: Payer: Self-pay | Admitting: Family Medicine

## 2021-02-15 DIAGNOSIS — I10 Essential (primary) hypertension: Secondary | ICD-10-CM

## 2021-05-01 ENCOUNTER — Other Ambulatory Visit: Payer: Self-pay | Admitting: Family Medicine

## 2021-05-01 DIAGNOSIS — R739 Hyperglycemia, unspecified: Secondary | ICD-10-CM

## 2021-05-01 DIAGNOSIS — R0683 Snoring: Secondary | ICD-10-CM

## 2021-05-01 DIAGNOSIS — R5383 Other fatigue: Secondary | ICD-10-CM

## 2021-05-01 DIAGNOSIS — N529 Male erectile dysfunction, unspecified: Secondary | ICD-10-CM

## 2021-05-01 DIAGNOSIS — I10 Essential (primary) hypertension: Secondary | ICD-10-CM

## 2021-05-01 DIAGNOSIS — N649 Disorder of breast, unspecified: Secondary | ICD-10-CM

## 2021-05-01 DIAGNOSIS — E663 Overweight: Secondary | ICD-10-CM

## 2021-05-31 ENCOUNTER — Other Ambulatory Visit: Payer: Self-pay | Admitting: Family Medicine

## 2021-05-31 DIAGNOSIS — I10 Essential (primary) hypertension: Secondary | ICD-10-CM

## 2021-05-31 DIAGNOSIS — E663 Overweight: Secondary | ICD-10-CM

## 2021-05-31 DIAGNOSIS — R5383 Other fatigue: Secondary | ICD-10-CM

## 2021-05-31 DIAGNOSIS — N529 Male erectile dysfunction, unspecified: Secondary | ICD-10-CM

## 2021-05-31 DIAGNOSIS — N649 Disorder of breast, unspecified: Secondary | ICD-10-CM

## 2021-05-31 DIAGNOSIS — R0683 Snoring: Secondary | ICD-10-CM

## 2021-05-31 DIAGNOSIS — R739 Hyperglycemia, unspecified: Secondary | ICD-10-CM

## 2021-06-17 ENCOUNTER — Other Ambulatory Visit: Payer: Self-pay | Admitting: Family Medicine

## 2021-08-11 ENCOUNTER — Other Ambulatory Visit: Payer: Self-pay | Admitting: Family Medicine

## 2021-08-19 ENCOUNTER — Other Ambulatory Visit: Payer: Self-pay | Admitting: Family Medicine

## 2021-08-27 ENCOUNTER — Other Ambulatory Visit: Payer: Self-pay | Admitting: Family Medicine

## 2021-08-27 DIAGNOSIS — I10 Essential (primary) hypertension: Secondary | ICD-10-CM

## 2021-08-27 DIAGNOSIS — N529 Male erectile dysfunction, unspecified: Secondary | ICD-10-CM

## 2021-08-27 DIAGNOSIS — E663 Overweight: Secondary | ICD-10-CM

## 2021-08-27 DIAGNOSIS — N649 Disorder of breast, unspecified: Secondary | ICD-10-CM

## 2021-08-27 DIAGNOSIS — R0683 Snoring: Secondary | ICD-10-CM

## 2021-08-27 DIAGNOSIS — R5383 Other fatigue: Secondary | ICD-10-CM

## 2021-08-27 DIAGNOSIS — R739 Hyperglycemia, unspecified: Secondary | ICD-10-CM

## 2021-08-27 NOTE — Telephone Encounter (Signed)
Called pt to schedule an appointment he has not been seen in almost a year

## 2021-08-30 ENCOUNTER — Other Ambulatory Visit: Payer: Self-pay | Admitting: Family Medicine

## 2021-08-30 DIAGNOSIS — R5383 Other fatigue: Secondary | ICD-10-CM

## 2021-08-30 DIAGNOSIS — I10 Essential (primary) hypertension: Secondary | ICD-10-CM

## 2021-08-30 DIAGNOSIS — R739 Hyperglycemia, unspecified: Secondary | ICD-10-CM

## 2021-08-30 DIAGNOSIS — N529 Male erectile dysfunction, unspecified: Secondary | ICD-10-CM

## 2021-08-30 DIAGNOSIS — E663 Overweight: Secondary | ICD-10-CM

## 2021-08-30 DIAGNOSIS — N649 Disorder of breast, unspecified: Secondary | ICD-10-CM

## 2021-08-30 DIAGNOSIS — R0683 Snoring: Secondary | ICD-10-CM

## 2021-09-02 ENCOUNTER — Other Ambulatory Visit: Payer: Self-pay | Admitting: Family Medicine

## 2021-09-02 DIAGNOSIS — N649 Disorder of breast, unspecified: Secondary | ICD-10-CM

## 2021-09-02 DIAGNOSIS — I10 Essential (primary) hypertension: Secondary | ICD-10-CM

## 2021-09-02 DIAGNOSIS — R0683 Snoring: Secondary | ICD-10-CM

## 2021-09-02 DIAGNOSIS — R739 Hyperglycemia, unspecified: Secondary | ICD-10-CM

## 2021-09-02 DIAGNOSIS — R5383 Other fatigue: Secondary | ICD-10-CM

## 2021-09-02 DIAGNOSIS — E663 Overweight: Secondary | ICD-10-CM

## 2021-09-02 DIAGNOSIS — N529 Male erectile dysfunction, unspecified: Secondary | ICD-10-CM

## 2021-09-03 ENCOUNTER — Other Ambulatory Visit: Payer: Self-pay

## 2021-09-03 DIAGNOSIS — N529 Male erectile dysfunction, unspecified: Secondary | ICD-10-CM

## 2021-09-03 DIAGNOSIS — R5383 Other fatigue: Secondary | ICD-10-CM

## 2021-09-03 DIAGNOSIS — I10 Essential (primary) hypertension: Secondary | ICD-10-CM

## 2021-09-03 DIAGNOSIS — R739 Hyperglycemia, unspecified: Secondary | ICD-10-CM

## 2021-09-03 DIAGNOSIS — E663 Overweight: Secondary | ICD-10-CM

## 2021-09-03 DIAGNOSIS — R0683 Snoring: Secondary | ICD-10-CM

## 2021-09-03 DIAGNOSIS — N649 Disorder of breast, unspecified: Secondary | ICD-10-CM

## 2021-09-03 MED ORDER — LOSARTAN POTASSIUM 100 MG PO TABS: ORAL_TABLET | ORAL | 0 refills | Status: DC

## 2021-09-03 MED ORDER — TRIAMTERENE-HCTZ 37.5-25 MG PO TABS: 1.0000 | ORAL_TABLET | Freq: Every day | ORAL | 0 refills | Status: DC

## 2021-09-15 ENCOUNTER — Other Ambulatory Visit: Payer: Self-pay | Admitting: Family Medicine

## 2021-09-27 ENCOUNTER — Other Ambulatory Visit: Payer: Self-pay | Admitting: Family Medicine

## 2021-11-09 ENCOUNTER — Ambulatory Visit (INDEPENDENT_AMBULATORY_CARE_PROVIDER_SITE_OTHER): Payer: 59 | Admitting: Family Medicine

## 2021-11-09 ENCOUNTER — Encounter: Payer: Self-pay | Admitting: Family Medicine

## 2021-11-09 ENCOUNTER — Other Ambulatory Visit: Payer: Self-pay

## 2021-11-09 VITALS — BP 150/78 | HR 79 | Temp 98.0°F | Resp 16 | Wt 251.2 lb

## 2021-11-09 DIAGNOSIS — R739 Hyperglycemia, unspecified: Secondary | ICD-10-CM

## 2021-11-09 DIAGNOSIS — I10 Essential (primary) hypertension: Secondary | ICD-10-CM

## 2021-11-09 DIAGNOSIS — E782 Mixed hyperlipidemia: Secondary | ICD-10-CM

## 2021-11-09 DIAGNOSIS — C61 Malignant neoplasm of prostate: Secondary | ICD-10-CM

## 2021-11-09 DIAGNOSIS — Z23 Encounter for immunization: Secondary | ICD-10-CM | POA: Diagnosis not present

## 2021-11-09 DIAGNOSIS — E1169 Type 2 diabetes mellitus with other specified complication: Secondary | ICD-10-CM

## 2021-11-09 LAB — LIPID PANEL
Cholesterol: 135 mg/dL (ref 0–200)
HDL: 38.7 mg/dL — ABNORMAL LOW (ref >39.00)
NonHDL: 95.91
Total CHOL/HDL Ratio: 3
Triglycerides: 318 mg/dL — ABNORMAL HIGH (ref 0.0–149.0)
VLDL: 63.6 mg/dL — ABNORMAL HIGH (ref 0.0–40.0)

## 2021-11-09 LAB — COMPREHENSIVE METABOLIC PANEL
ALT: 44 U/L (ref 0–53)
AST: 32 U/L (ref 0–37)
Albumin: 4.6 g/dL (ref 3.5–5.2)
Alkaline Phosphatase: 65 U/L (ref 39–117)
BUN: 19 mg/dL (ref 6–23)
CO2: 26 mEq/L (ref 19–32)
Calcium: 9.8 mg/dL (ref 8.4–10.5)
Chloride: 97 mEq/L (ref 96–112)
Creatinine, Ser: 0.98 mg/dL (ref 0.40–1.50)
GFR: 82 mL/min (ref >60.00)
Glucose, Bld: 235 mg/dL — ABNORMAL HIGH (ref 70–99)
Potassium: 3.7 mEq/L (ref 3.5–5.1)
Sodium: 133 mEq/L — ABNORMAL LOW (ref 135–145)
Total Bilirubin: 1.3 mg/dL — ABNORMAL HIGH (ref 0.2–1.2)
Total Protein: 7.3 g/dL (ref 6.0–8.3)

## 2021-11-09 LAB — CBC
HCT: 45.5 % (ref 39.0–52.0)
Hemoglobin: 15.5 g/dL (ref 13.0–17.0)
MCHC: 33.9 g/dL (ref 30.0–36.0)
MCV: 87.2 fl (ref 78.0–100.0)
Platelets: 212 10*3/uL (ref 150.0–400.0)
RBC: 5.22 Mil/uL (ref 4.22–5.81)
RDW: 13.3 % (ref 11.5–15.5)
WBC: 8.2 10*3/uL (ref 4.0–10.5)

## 2021-11-09 LAB — LDL CHOLESTEROL, DIRECT: Direct LDL: 72 mg/dL

## 2021-11-09 LAB — TSH: TSH: 1.35 u[IU]/mL (ref 0.35–5.50)

## 2021-11-09 LAB — HEMOGLOBIN A1C: Hgb A1c MFr Bld: 12 % — ABNORMAL HIGH (ref 4.6–6.5)

## 2021-11-09 NOTE — Assessment & Plan Note (Signed)
Tolerating statin, encouraged heart healthy diet, avoid trans fats, minimize simple carbs and saturated fats. Increase exercise as tolerated 

## 2021-11-09 NOTE — Assessment & Plan Note (Addendum)
Mildly elevated but patient very stressed with running around today, no changes to meds. Encouraged heart healthy diet such as the DASH diet and exercise as tolerated. He is encouraged to monitor his resting BP at home and report them to [place] so we can adjust meds if they remain elevated at home

## 2021-11-09 NOTE — Patient Instructions (Signed)

## 2021-11-09 NOTE — Assessment & Plan Note (Addendum)
hgba1c unacceptable, minimize simple carbs. Increase exercise as tolerated. Add Metformin 500 mg bid and offered referral to Nutritionist and endocrinology

## 2021-11-09 NOTE — Progress Notes (Signed)
Patient ID: Joseph Fowler, male    DOB: 07/18/1956  Age: 65 y.o. MRN: [id number]    Subjective:   Chief Complaint  Patient presents with   Medication Refill   Subjective   HPI ELAZAR ARGABRIGHT presents for office visit today for follow up on HTN and BPH. He has had a prostatectomy by Dr. Alinda Money in 10/15/2020. 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 So this is adding to his stress and might be contributing to his elevated BP. Denies CP/palp/SOB/HA/congestion/fevers/GI or GU c/o. Taking meds as prescribed. He has had the first covid shot.   Review of Systems  Constitutional:  Negative for chills, fatigue and fever.  HENT:  Negative for congestion, rhinorrhea, sinus pressure, sinus pain and sore throat.   Eyes:  Negative for pain.  Respiratory:  Negative for cough and shortness of breath.   Cardiovascular:  Negative for chest pain, palpitations and leg swelling.  Gastrointestinal:  Negative for abdominal pain, blood in stool, diarrhea, nausea and vomiting.  Genitourinary:  Negative for flank pain, frequency and penile pain.  Musculoskeletal:  Negative for back pain.  Neurological:  Negative for headaches.   History Past Medical History:  Diagnosis Date   Allergy    Arthritis 09/06/2013   Blood in stool 12/03/2012   BPH (benign prostatic hyperplasia) 09/12/2012   Chicken pox as a child   Dermatitis 09/12/2012   ED (erectile dysfunction) 09/12/2012   Hyperlipidemia    Hypertension 65 yrs old   Nocturia 09/12/2012   Obesity 09/12/2012   Overweight(278.02) 09/12/2012   PONV (postoperative nausea and vomiting)    Has nausea   Preventative health care 09/12/2012   Prostate cancer [facility])    Snoring 09/12/2012    He has a past surgical history that includes Appendectomy (65 yrs old); Vasectomy (65 yrs old); torn meninscus (2011); Eye surgery (2005); Prostate biopsy; Hernia repair; Robot assisted laparoscopic radical prostatectomy  (N/A, 10/15/2020); and Lymphadenectomy (Bilateral, 10/15/2020).   His family history includes Arthritis in his paternal uncle; Diabetes in his father; Heart disease in his brother; Other in his brother; Stroke in his maternal grandmother.He reports that he quit smoking about 39 years ago. His smoking use included cigarettes. He has a 16.00 pack-year smoking history. He has never used smokeless tobacco. He reports that he does not drink alcohol and does not use drugs.  Current Outpatient Medications on File Prior to Visit  Medication Sig Dispense Refill   amLODipine (NORVASC) 5 MG tablet Take 1 tablet (5 mg total) by mouth in the morning and at bedtime. 60 tablet 0   atorvastatin (LIPITOR) 20 MG tablet TAKE 1 TABLET BY MOUTH AT BEDTIME 90 tablet 1   loratadine (CLARITIN) 10 MG tablet Take 1 tablet (10 mg total) by mouth daily. 90 tablet 1   losartan (COZAAR) 100 MG tablet TAKE 1 TABLET BY MOUTH AT BEDTIME **APPOINTMENT  REQUIRED  FOR  FUTURE  REFILLS** 90 tablet 0   triamcinolone (NASACORT) 55 MCG/ACT AERO nasal inhaler Place 1 spray into the nose daily as needed (Allergy).     triamterene-hydrochlorothiazide (MAXZIDE-25) 37.5-25 MG tablet Take 1 tablet by mouth daily. 90 tablet 0   sildenafil (VIAGRA) 100 MG tablet TAKE 1 TABLET BY MOUTH ONCE DAILY AS NEEDED FOR ERECTILE DYSFUNCTION 4 tablet 0   No current facility-administered medications on file prior to visit.     Objective:  Objective  Physical Exam Constitutional:  General: He is not in acute distress.    Appearance: Normal appearance. He is not ill-appearing or toxic-appearing.  HENT:     Head: Normocephalic and atraumatic.     Right Ear: Tympanic membrane, ear canal and external ear normal.     Left Ear: Tympanic membrane, ear canal and external ear normal.     Nose: No congestion or rhinorrhea.  Eyes:     Extraocular Movements: Extraocular movements intact.     Pupils: Pupils are equal, round, and reactive to light.   Cardiovascular:     Rate and Rhythm: Normal rate and regular rhythm.     Pulses: Normal pulses.     Heart sounds: Normal heart sounds. No murmur heard. Pulmonary:     Effort: Pulmonary effort is normal. No respiratory distress.     Breath sounds: Normal breath sounds. No wheezing, rhonchi or rales.  Abdominal:     General: Bowel sounds are normal.     Palpations: Abdomen is soft. There is no mass.     Tenderness: There is no abdominal tenderness. There is no guarding.     Hernia: No hernia is present.  Musculoskeletal:        General: Normal range of motion.     Cervical back: Normal range of motion and neck supple.  Skin:    General: Skin is warm and dry.  Neurological:     Mental Status: He is alert and oriented to person, place, and time.  Psychiatric:        Behavior: Behavior normal.   BP (!) 150/78 (BP Location: Left Arm, Patient Position: Sitting)   Pulse 79   Temp 98 F (36.7 C)   Resp 16   Wt 251 lb 3.2 oz (113.9 kg)   SpO2 97%   BMI 32.25 kg/m  Wt Readings from Last 3 Encounters:  11/09/21 251 lb 3.2 oz (113.9 kg)  10/15/20 243 lb 2.7 oz (110.3 kg)  07/10/20 242 lb (109.8 kg)     Lab Results  Component Value Date   WBC 8.2 11/09/2021   HGB 15.5 11/09/2021   HCT 45.5 11/09/2021   PLT 212.0 11/09/2021   GLUCOSE 235 (H) 11/09/2021   CHOL 135 11/09/2021   TRIG 318.0 (H) 11/09/2021   HDL 38.70 (L) 11/09/2021   LDLDIRECT 72.0 11/09/2021   LDLCALC 45 06/30/2020   ALT 44 11/09/2021   AST 32 11/09/2021   NA 133 (L) 11/09/2021   K 3.7 11/09/2021   CL 97 11/09/2021   CREATININE 0.98 11/09/2021   BUN 19 11/09/2021   CO2 26 11/09/2021   TSH 1.35 11/09/2021   PSA 6.36 06/18/2020   HGBA1C 12.0 (H) 11/09/2021    No results found.   Assessment & Plan:  Plan    No orders of the defined types were placed in this encounter.   Problem List Items Addressed This Visit     Hypertension - Primary    Mildly elevated but patient very stressed with running  around today, no changes to meds. Encouraged heart healthy diet such as the DASH diet and exercise as tolerated. He is encouraged to monitor his resting BP at home and report them to [place] so we can adjust meds if they remain elevated at home      Relevant Orders   CBC (Completed)   Comprehensive metabolic panel (Completed)   TSH (Completed)   DM type 2 with diabetic mixed hyperlipidemia (HCC)    hgba1c unacceptable, minimize simple carbs. Increase exercise as  tolerated. Add Metformin 500 mg bid and offered referral to Nutritionist and endocrinology      Hyperlipidemia, mixed    Tolerating statin, encouraged heart healthy diet, avoid trans fats, minimize simple carbs and saturated fats. Increase exercise as tolerated      Relevant Orders   Lipid panel (Completed)   Prostate cancer ([place])    Has been treated by urology and is feeling much better, urinary frequency is greatly improved      Other Visit Diagnoses     Influenza vaccine administered       Relevant Orders   Flu Vaccine QUAD 6+ mos PF IM (Fluarix Quad PF) (Completed)   Need for Tdap vaccination       Relevant Orders   Tdap vaccine greater than or equal to 7yo IM (Completed)       Follow-up: Return in about 6 months (around 05/09/2022) for annual cpe.  I, Suezanne Jacquet, acting as a scribe for Penni Homans, MD, have documented all relevent documentation on behalf of Penni Homans, MD, as directed by Penni Homans, MD while in the presence of Penni Homans, MD. DO:11/09/21.  I, Mosie Lukes, MD personally performed the services described in this documentation. All medical record entries made by the scribe were at my direction and in my presence. I have reviewed the chart and agree that the record reflects my personal performance and is accurate and complete

## 2021-11-09 NOTE — Assessment & Plan Note (Signed)
Has been treated by urology and is feeling much better, urinary frequency is greatly improved

## 2021-11-10 ENCOUNTER — Other Ambulatory Visit: Payer: Self-pay

## 2021-11-10 MED ORDER — BLOOD GLUCOSE METER KIT: PACK | 0 refills | Status: AC

## 2021-11-10 MED ORDER — ACCU-CHEK SOFTCLIX LANCETS MISC: 10 refills | Status: DC

## 2021-11-10 MED ORDER — GLUCOSE BLOOD VI STRP: ORAL_STRIP | 10 refills | Status: DC

## 2021-11-11 ENCOUNTER — Other Ambulatory Visit: Payer: Self-pay

## 2021-11-11 DIAGNOSIS — E782 Mixed hyperlipidemia: Secondary | ICD-10-CM

## 2021-11-11 DIAGNOSIS — E1169 Type 2 diabetes mellitus with other specified complication: Secondary | ICD-10-CM

## 2021-11-11 DIAGNOSIS — I10 Essential (primary) hypertension: Secondary | ICD-10-CM

## 2021-11-11 MED ORDER — METFORMIN HCL 500 MG PO TABS: 500.0000 mg | ORAL_TABLET | Freq: Two times a day (BID) | ORAL | 0 refills | Status: DC

## 2021-11-23 ENCOUNTER — Other Ambulatory Visit: Payer: Self-pay | Admitting: Family Medicine

## 2021-11-23 DIAGNOSIS — R739 Hyperglycemia, unspecified: Secondary | ICD-10-CM

## 2021-11-23 DIAGNOSIS — E663 Overweight: Secondary | ICD-10-CM

## 2021-11-23 DIAGNOSIS — N649 Disorder of breast, unspecified: Secondary | ICD-10-CM

## 2021-11-23 DIAGNOSIS — R0683 Snoring: Secondary | ICD-10-CM

## 2021-11-23 DIAGNOSIS — R5383 Other fatigue: Secondary | ICD-10-CM

## 2021-11-23 DIAGNOSIS — N529 Male erectile dysfunction, unspecified: Secondary | ICD-10-CM

## 2021-11-23 DIAGNOSIS — I10 Essential (primary) hypertension: Secondary | ICD-10-CM

## 2021-11-25 ENCOUNTER — Other Ambulatory Visit: Payer: Self-pay | Admitting: Family Medicine

## 2021-11-25 DIAGNOSIS — I10 Essential (primary) hypertension: Secondary | ICD-10-CM

## 2021-12-07 ENCOUNTER — Telehealth: Payer: Self-pay | Admitting: Family Medicine

## 2021-12-07 NOTE — Telephone Encounter (Signed)
Pt. Daughter called in and stated pt dad just tested positive for covid. He has other health issues and is now having issues with breathing. Transferred to triage for further advice.

## 2021-12-07 NOTE — Telephone Encounter (Signed)
Nurse Assessment Nurse: Redmond Pulling, RN, Levada Dy Date/Time Eilene Ghazi Time): 12/07/2021 4:05:12 PM Confirm and document reason for call. If symptomatic, describe symptoms. ---Caller states, her father is currently tested positive for Covid-19 today with a home test, she states right now he is complain is having difficulty breathing. Temperature is 99. Cold chills, freezing then hot, pounding headache. Took exedrin and that did not help. Does the patient have any new or worsening symptoms? ---Yes Will a triage be completed? ---Yes Related visit to physician within the last 2 weeks? ---No Does the PT have any chronic conditions? (i.e. diabetes, asthma, this includes High risk factors for pregnancy, etc.) ---Yes List chronic conditions. ---Hx prostate Cancer. Diabetic Type II, HTN Is this a behavioral health or substance abuse call? ---No Guidelines Guideline Title Affirmed Question Affirmed Notes Nurse Date/Time Eilene Ghazi Time) COVID-19 - Diagnosed or Suspected MILD difficulty breathing (e.g., minimal/no SOB Redmond Pulling, RN, Levada Dy 12/07/2021 4:09:02 PM PLEASE NOTE: All timestamps contained within this report are represented as [place] Standard Time. CONFIDENTIALTY NOTICE: This fax transmission is intended only for the addressee. It contains information that is legally privileged, confidential or otherwise protected from use or disclosure. If you are not the intended recipient, you are strictly prohibited from reviewing, disclosing, copying using or disseminating any of this information or taking any action in reliance on or regarding this information. If you have received this fax in error, please notify us immediately by telephone so that we can arrange for its return to [place]. Phone: [phone], Toll-Free: [phone], Fax: [phone] Page: 2 of 2 Call Id: [id number] Guidelines Guideline Title Affirmed Question Affirmed Notes Nurse Date/Time Eilene Ghazi Time) at rest, SOB with walking,  pulse <100) Disp. Time Eilene Ghazi Time) Disposition Final User 12/07/2021 4:03:54 PM Send to Urgent Queue Waldemar Dickens 24/46/2863 4:13:37 PM See HCP within 4 Hours (or PCP triage) Yes Redmond Pulling, RN, Marin Shutter Disagree/Comply Comply Caller Understands Yes PreDisposition InappropriateToAsk Care Advice Given Per Guideline SEE HCP (OR PCP TRIAGE) WITHIN 4 HOURS: * IF OFFICE WILL BE OPEN: You need to be seen within the next 3 or 4 hours. Call your doctor (or NP/PA) now or as soon as the office opens. GENERAL CARE ADVICE FOR COVID-19 SYMPTOMS: * Feeling dehydrated: Drink extra liquids. If the air in your home is dry, use a humidifier. * Fever: For fever over 101 F (38.3 C), take acetaminophen every 4 to 6 hours (Adults 650 mg) OR ibuprofen every 6 to 8 hours (Adults 400 mg). Before taking any medicine, read all the instructions on the package. Do not take aspirin unless your doctor has prescribed it for you. CALL BACK IF: * You become worse CARE ADVICE given per COVID-19 - DIAGNOSED OR SUSPECTED (Adult) guideline. Referrals GO TO FACILITY OTHER - SPECIFY

## 2021-12-08 NOTE — Telephone Encounter (Signed)
He has an appointment with Percell Miller tomorrow now

## 2021-12-09 ENCOUNTER — Telehealth (INDEPENDENT_AMBULATORY_CARE_PROVIDER_SITE_OTHER): Payer: 59 | Admitting: Medical

## 2021-12-09 ENCOUNTER — Encounter: Payer: Self-pay | Admitting: Medical

## 2021-12-09 VITALS — BP 147/101

## 2021-12-09 DIAGNOSIS — I10 Essential (primary) hypertension: Secondary | ICD-10-CM | POA: Diagnosis not present

## 2021-12-09 DIAGNOSIS — R051 Acute cough: Secondary | ICD-10-CM

## 2021-12-09 DIAGNOSIS — M791 Myalgia, unspecified site: Secondary | ICD-10-CM

## 2021-12-09 DIAGNOSIS — U071 COVID-19: Secondary | ICD-10-CM | POA: Diagnosis not present

## 2021-12-09 MED ORDER — MOLNUPIRAVIR EUA 200MG CAPSULE: 4.0000 | ORAL_CAPSULE | Freq: Two times a day (BID) | ORAL | 0 refills | Status: AC

## 2021-12-09 MED ORDER — HYDROCODONE BIT-HOMATROP MBR 5-1.5 MG/5ML PO SOLN: 5.0000 mL | Freq: Four times a day (QID) | ORAL | 0 refills | Status: DC | PRN

## 2021-12-09 MED ORDER — FLUTICASONE PROPIONATE 50 MCG/ACT NA SUSP: 2.0000 | Freq: Every day | NASAL | 1 refills | Status: AC

## 2021-12-09 NOTE — Patient Instructions (Addendum)
Covid infection. Day 4 of moderate symptom. Vaccinated x 1.  Risk score of 5.  Prescribed molnupiravir antiviral.  Patient is on 2 medications interact with paxlovid.   Also prescribed Hycodan for severe cough and Flonase for nasal congestion.  Can use vitamin D over-the-counter 2000 to 4000 international units daily and zinc 50 mg daily over-the-counter.  Is having secondary sinus infection or bronchitis type symptoms let us know.  If significant chest congestion occurs then would recommend getting chest x-ray.  Advised to get O2 sat monitor and check O2 sats daily.  Send a MyChart update on the O2 saturation normal.  Educated on good level for O2 sat and levels that would indicate need to be evaluated emergency department.  Hypertension-blood pressure is elevated but patient did not take his blood pressure medicine today.  Advised that if he takes his meds and his blood pressure is less than 140/90 they can use ibuprofen 200 to 400 mg for headache or body aches.  Follow-up in 7 days or sooner if needed.

## 2021-12-09 NOTE — Progress Notes (Signed)
° °  Subjective:    Patient ID: Joseph Fowler, male    DOB: 1956-03-10, 65 y.o.   MRN: [id number]  HPI  Virtual Visit via Video Note  I connected with [name] on 12/09/21 at  1:20 PM EST by a video enabled telemedicine application and verified that I am speaking with the correct person using two identifiers.  Location: Patient: home Provider: office   I discussed the limitations of evaluation and management by telemedicine and the availability of in person appointments. The patient expressed understanding and agreed to proceed.  History of Present Illness: Pt recently diagnosed with covid on Tuesday.  Test used-otc x 2.  History of covid vaccine x 1 of phizer.  Covid risk score- 5  History of covid infection-no  Current symptoms-started Monday afternoon. Got bad bodyaches, ha, stuffy nose and st. Cough moderate. Tuesday tested +. Pt retested today and positive. Has a lot of fatigue. Joint pains as well.  He cough a lot at night. Feeling some shortness of breath.  Pt 02 sat-does not have   Pt did not take his 2 BP meds   Observations/Objective: General-no acute distress, pleasant, oriented. Lungs- on inspection lungs appear unlabored. Neck- no tracheal deviation or jvd on inspection. Neuro- gross motor function appears intact.   Assessment and Plan:  Patient Instructions  Covid infection. Day 4 of moderate symptom. Vaccinated x 1.  Risk score of 5.  Prescribed molnupiravir antiviral.  Patient is on 2 medications interact with paxlovid.   Also prescribed Hycodan for severe cough and Flonase for nasal congestion.  Can use vitamin D over-the-counter 2000 to 4000 international units daily and zinc 50 mg daily over-the-counter.  Is having secondary sinus infection or bronchitis type symptoms let us know.  If significant chest congestion occurs then would recommend getting chest x-ray.  Advised to get O2 sat monitor and check O2 sats daily.  Send a MyChart update on  the O2 saturation normal.  Educated on good level for O2 sat and levels that would indicate need to be evaluated emergency department.  Follow-up in 7 days or sooner if needed.   Mackie Pai, PA-C   Time spent with patient today was 30 minutes which consisted of chart revdiew, discussing diagnosis, work up treatment and documentation.  Follow Up Instructions:    I discussed the assessment and treatment plan with the patient. The patient was provided an opportunity to ask questions and all were answered. The patient agreed with the plan and demonstrated an understanding of the instructions.   The patient was advised to call back or seek an in-person evaluation if the symptoms worsen or if the condition fails to improve as anticipated.     Mackie Pai, PA-C   Review of Systems  Constitutional:  Positive for fatigue. Negative for chills and fever.  HENT:  Positive for congestion, sinus pressure and sinus pain.   Respiratory:  Negative for chest tightness.   Cardiovascular:  Negative for chest pain and palpitations.  Gastrointestinal:  Negative for abdominal pain.  Genitourinary:  Negative for dysuria, flank pain, frequency and testicular pain.  Musculoskeletal:  Positive for arthralgias and myalgias. Negative for back pain.  Skin:  Negative for rash.      Objective:   Physical Exam        Assessment & Plan:

## 2022-01-05 ENCOUNTER — Other Ambulatory Visit: Payer: Self-pay

## 2022-01-05 MED ORDER — ONETOUCH VERIO W/DEVICE KIT: PACK | 1 refills | Status: AC

## 2022-01-05 MED ORDER — ONETOUCH ULTRASOFT LANCETS MISC: 12 refills | Status: DC

## 2022-01-05 MED ORDER — GLUCOSE BLOOD VI STRP: ORAL_STRIP | 11 refills | Status: DC

## 2022-01-05 MED ORDER — ONETOUCH VERIO VI STRP: ORAL_STRIP | 12 refills | Status: AC

## 2022-01-05 MED ORDER — ONETOUCH DELICA LANCETS 33G MISC: 11 refills | Status: AC

## 2022-01-05 NOTE — Addendum Note (Signed)
Addended by: Randolm Idol A on: 01/05/2022 11:10 AM   Modules accepted: Orders

## 2022-01-05 NOTE — Addendum Note (Signed)
Addended by: Randolm Idol A on: 01/05/2022 03:56 PM   Modules accepted: Orders

## 2022-02-11 ENCOUNTER — Other Ambulatory Visit (INDEPENDENT_AMBULATORY_CARE_PROVIDER_SITE_OTHER): Payer: Managed Care, Other (non HMO)

## 2022-02-11 DIAGNOSIS — I10 Essential (primary) hypertension: Secondary | ICD-10-CM

## 2022-02-11 DIAGNOSIS — E1169 Type 2 diabetes mellitus with other specified complication: Secondary | ICD-10-CM | POA: Diagnosis not present

## 2022-02-11 DIAGNOSIS — E782 Mixed hyperlipidemia: Secondary | ICD-10-CM

## 2022-02-11 LAB — CBC WITH DIFFERENTIAL/PLATELET
Basophils Absolute: 0.1 10*3/uL (ref 0.0–0.1)
Basophils Relative: 0.7 % (ref 0.0–3.0)
Eosinophils Absolute: 0.2 10*3/uL (ref 0.0–0.7)
Eosinophils Relative: 2.3 % (ref 0.0–5.0)
HCT: 41.2 % (ref 39.0–52.0)
Hemoglobin: 14 g/dL (ref 13.0–17.0)
Lymphocytes Relative: 29.9 % (ref 12.0–46.0)
Lymphs Abs: 2.9 10*3/uL (ref 0.7–4.0)
MCHC: 34 g/dL (ref 30.0–36.0)
MCV: 87.2 fl (ref 78.0–100.0)
Monocytes Absolute: 0.8 10*3/uL (ref 0.1–1.0)
Monocytes Relative: 8.1 % (ref 3.0–12.0)
Neutro Abs: 5.7 10*3/uL (ref 1.4–7.7)
Neutrophils Relative %: 59 % (ref 43.0–77.0)
Platelets: 245 10*3/uL (ref 150.0–400.0)
RBC: 4.72 Mil/uL (ref 4.22–5.81)
RDW: 13.8 % (ref 11.5–15.5)
WBC: 9.7 10*3/uL (ref 4.0–10.5)

## 2022-02-11 LAB — COMPREHENSIVE METABOLIC PANEL
ALT: 28 U/L (ref 0–53)
AST: 20 U/L (ref 0–37)
Albumin: 4.7 g/dL (ref 3.5–5.2)
Alkaline Phosphatase: 47 U/L (ref 39–117)
BUN: 19 mg/dL (ref 6–23)
CO2: 31 mEq/L (ref 19–32)
Calcium: 9.8 mg/dL (ref 8.4–10.5)
Chloride: 100 mEq/L (ref 96–112)
Creatinine, Ser: 1.05 mg/dL (ref 0.40–1.50)
GFR: 75.35 mL/min (ref >60.00)
Glucose, Bld: 101 mg/dL — ABNORMAL HIGH (ref 70–99)
Potassium: 3.6 mEq/L (ref 3.5–5.1)
Sodium: 137 mEq/L (ref 135–145)
Total Bilirubin: 1.3 mg/dL — ABNORMAL HIGH (ref 0.2–1.2)
Total Protein: 7.1 g/dL (ref 6.0–8.3)

## 2022-02-11 LAB — LIPID PANEL
Cholesterol: 120 mg/dL (ref 0–200)
HDL: 35.2 mg/dL — ABNORMAL LOW (ref >39.00)
NonHDL: 84.5
Total CHOL/HDL Ratio: 3
Triglycerides: 279 mg/dL — ABNORMAL HIGH (ref 0.0–149.0)
VLDL: 55.8 mg/dL — ABNORMAL HIGH (ref 0.0–40.0)

## 2022-02-11 LAB — LDL CHOLESTEROL, DIRECT: Direct LDL: 63 mg/dL

## 2022-02-11 LAB — HEMOGLOBIN A1C: Hgb A1c MFr Bld: 7 % — ABNORMAL HIGH (ref 4.6–6.5)

## 2022-02-11 LAB — TSH: TSH: 1.29 u[IU]/mL (ref 0.35–5.50)

## 2022-02-14 ENCOUNTER — Other Ambulatory Visit: Payer: Self-pay | Admitting: Family Medicine

## 2022-02-20 ENCOUNTER — Other Ambulatory Visit: Payer: Self-pay | Admitting: Family Medicine

## 2022-02-23 LAB — HM DIABETES EYE EXAM

## 2022-03-06 ENCOUNTER — Other Ambulatory Visit: Payer: Self-pay | Admitting: Family Medicine

## 2022-03-11 ENCOUNTER — Encounter: Payer: Self-pay | Admitting: Family Medicine

## 2022-03-14 ENCOUNTER — Other Ambulatory Visit: Payer: Self-pay

## 2022-03-14 DIAGNOSIS — I10 Essential (primary) hypertension: Secondary | ICD-10-CM

## 2022-03-14 MED ORDER — AMLODIPINE BESYLATE 5 MG PO TABS: ORAL_TABLET | ORAL | 1 refills | Status: DC

## 2022-04-18 ENCOUNTER — Other Ambulatory Visit: Payer: Self-pay

## 2022-05-18 ENCOUNTER — Other Ambulatory Visit: Payer: Self-pay | Admitting: Family Medicine

## 2022-05-18 DIAGNOSIS — N529 Male erectile dysfunction, unspecified: Secondary | ICD-10-CM

## 2022-05-18 DIAGNOSIS — N649 Disorder of breast, unspecified: Secondary | ICD-10-CM

## 2022-05-18 DIAGNOSIS — I10 Essential (primary) hypertension: Secondary | ICD-10-CM

## 2022-05-18 DIAGNOSIS — R0683 Snoring: Secondary | ICD-10-CM

## 2022-05-18 DIAGNOSIS — R5383 Other fatigue: Secondary | ICD-10-CM

## 2022-05-18 DIAGNOSIS — E663 Overweight: Secondary | ICD-10-CM

## 2022-05-18 DIAGNOSIS — R739 Hyperglycemia, unspecified: Secondary | ICD-10-CM

## 2022-05-25 NOTE — Progress Notes (Unsigned)
Subjective:    Patient ID: Joseph Fowler, male    DOB: [birth year], 66 y.o.   MRN: [id number]  No chief complaint on file.   HPI Patient is in today for his annual physical exam.  Past Medical History:  Diagnosis Date   Allergy    Arthritis 09/06/2013   Blood in stool 12/03/2012   BPH (benign prostatic hyperplasia) 09/12/2012   Chicken pox as a child   Dermatitis 09/12/2012   ED (erectile dysfunction) 09/12/2012   Hyperlipidemia    Hypertension 66 yrs old   Nocturia 09/12/2012   Obesity 09/12/2012   Overweight(278.02) 09/12/2012   PONV (postoperative nausea and vomiting)    Has nausea   Preventative health care 09/12/2012   Prostate cancer ([place])    Snoring 09/12/2012    Past Surgical History:  Procedure Laterality Date   APPENDECTOMY  66 yrs old   EYE SURGERY  2005   ,right, injury, decreased visual acuity, photophobia   HERNIA REPAIR     LYMPHADENECTOMY Bilateral 10/15/2020   Procedure: LYMPHADENECTOMY, PELVIC;  Surgeon: Raynelle Bring, MD;  Location: [facility];  Service: Urology;  Laterality: Bilateral;   PROSTATE BIOPSY     ROBOT ASSISTED LAPAROSCOPIC RADICAL PROSTATECTOMY N/A 10/15/2020   Procedure: XI ROBOTIC ASSISTED LAPAROSCOPIC RADICAL PROSTATECTOMY LEVEL 2;  Surgeon: Raynelle Bring, MD;  Location: [facility];  Service: Urology;  Laterality: N/A;   torn meninscus  2011   knee- right   VASECTOMY  66 yrs old    Family History  Problem Relation Age of Onset   Diabetes Father        type 2   Other Brother        whole in heart   Heart disease Brother        sudden cardiac death   Stroke Maternal Grandmother        X 2   Arthritis Paternal Uncle    Colon cancer Neg Hx     Social History   Socioeconomic History   Marital status: Married    Spouse name: Not on file   Number of children: 1   Years of education: Not on file   Highest education level: Not on file  Occupational History   Not on file  Tobacco Use   Smoking status: Former    Packs/day: 2.00     Years: 8.00    Pack years: 16.00    Types: Cigarettes    Quit date: 12/26/1981    Years since quitting: 40.4   Smokeless tobacco: Never  Vaping Use   Vaping Use: Never used  Substance and Sexual Activity   Alcohol use: No    Comment: Very rare   Drug use: No   Sexual activity: Yes  Other Topics Concern   Not on file  Social History Narrative   Not on file   Social Determinants of Health   Financial Resource Strain: Not on file  Food Insecurity: Not on file  Transportation Needs: Not on file  Physical Activity: Not on file  Stress: Not on file  Social Connections: Not on file  Intimate Partner Violence: Not on file    Outpatient Medications Prior to Visit  Medication Sig Dispense Refill   amLODipine (NORVASC) 5 MG tablet TAKE 1 TABLET BY MOUTH IN THE MORNING AND AT BEDTIME 180 tablet 1   atorvastatin (LIPITOR) 20 MG tablet TAKE 1 TABLET BY MOUTH AT BEDTIME 90 tablet 1   blood glucose meter kit and supplies Dispense based on  patient and insurance preference. Use up to four times daily as directed. (FOR ICD-10 E10.9, E11.9). 1 each 0   Blood Glucose Monitoring Suppl (ONETOUCH VERIO) w/Device KIT Use up to four times daily as directed. 1 kit 1   fluticasone (FLONASE) 50 MCG/ACT nasal spray Place 2 sprays into both nostrils daily. 16 g 1   glucose blood (ONETOUCH VERIO) test strip Use as instructed 100 each 12   HYDROcodone bit-homatropine (HYCODAN) 5-1.5 MG/5ML syrup Take 5 mLs by mouth every 6 (six) hours as needed for cough. 120 mL 0   loratadine (CLARITIN) 10 MG tablet Take 1 tablet (10 mg total) by mouth daily. 90 tablet 1   losartan (COZAAR) 100 MG tablet TAKE 1 TABLET BY MOUTH ONCE DAILY AT BEDTIME 90 tablet 0   metFORMIN (GLUCOPHAGE) 500 MG tablet TAKE 1 TABLET BY MOUTH TWICE DAILY WITH A MEAL 180 tablet 0   OneTouch Delica Lancets 01X MISC Use as instructed 100 each 11   sildenafil (VIAGRA) 100 MG tablet TAKE 1 TABLET BY MOUTH ONCE DAILY AS NEEDED FOR ERECTILE DYSFUNCTION  4 tablet 0   triamcinolone (NASACORT) 55 MCG/ACT AERO nasal inhaler Place 1 spray into the nose daily as needed (Allergy).     triamterene-hydrochlorothiazide (MAXZIDE-25) 37.5-25 MG tablet Take 1 tablet by mouth once daily 90 tablet 0   No facility-administered medications prior to visit.    No Known Allergies  ROS     Objective:    Physical Exam  There were no vitals taken for this visit. Wt Readings from Last 3 Encounters:  11/09/21 251 lb 3.2 oz (113.9 kg)  10/15/20 243 lb 2.7 oz (110.3 kg)  07/10/20 242 lb (109.8 kg)    Diabetic Foot Exam - Simple   No data filed    Lab Results  Component Value Date   WBC 9.7 02/11/2022   HGB 14.0 02/11/2022   HCT 41.2 02/11/2022   PLT 245.0 02/11/2022   GLUCOSE 101 (H) 02/11/2022   CHOL 120 02/11/2022   TRIG 279.0 (H) 02/11/2022   HDL 35.20 (L) 02/11/2022   LDLDIRECT 63.0 02/11/2022   LDLCALC 45 06/30/2020   ALT 28 02/11/2022   AST 20 02/11/2022   NA 137 02/11/2022   K 3.6 02/11/2022   CL 100 02/11/2022   CREATININE 1.05 02/11/2022   BUN 19 02/11/2022   CO2 31 02/11/2022   TSH 1.29 02/11/2022   PSA 6.36 06/18/2020   HGBA1C 7.0 (H) 02/11/2022    Lab Results  Component Value Date   TSH 1.29 02/11/2022   Lab Results  Component Value Date   WBC 9.7 02/11/2022   HGB 14.0 02/11/2022   HCT 41.2 02/11/2022   MCV 87.2 02/11/2022   PLT 245.0 02/11/2022   Lab Results  Component Value Date   NA 137 02/11/2022   K 3.6 02/11/2022   CO2 31 02/11/2022   GLUCOSE 101 (H) 02/11/2022   BUN 19 02/11/2022   CREATININE 1.05 02/11/2022   BILITOT 1.3 (H) 02/11/2022   ALKPHOS 47 02/11/2022   AST 20 02/11/2022   ALT 28 02/11/2022   PROT 7.1 02/11/2022   ALBUMIN 4.7 02/11/2022   CALCIUM 9.8 02/11/2022   ANIONGAP 10 10/05/2020   GFR 75.35 02/11/2022   Lab Results  Component Value Date   CHOL 120 02/11/2022   Lab Results  Component Value Date   HDL 35.20 (L) 02/11/2022   Lab Results  Component Value Date   LDLCALC  45 06/30/2020   Lab Results  Component Value Date   TRIG 279.0 (H) 02/11/2022   Lab Results  Component Value Date   CHOLHDL 3 02/11/2022   Lab Results  Component Value Date   HGBA1C 7.0 (H) 02/11/2022       Assessment & Plan:   Problem List Items Addressed This Visit   None   I am having Joseph Fowler maintain his loratadine, triamcinolone, sildenafil, blood glucose meter kit and supplies, HYDROcodone bit-homatropine, fluticasone, OneTouch Verio, OneTouch Verio, OneTouch Delica Lancets 93X, metFORMIN, atorvastatin, amLODipine, triamterene-hydrochlorothiazide, and losartan.  No orders of the defined types were placed in this encounter.

## 2022-05-26 ENCOUNTER — Telehealth: Payer: Self-pay | Admitting: Cardiology

## 2022-05-26 ENCOUNTER — Ambulatory Visit (INDEPENDENT_AMBULATORY_CARE_PROVIDER_SITE_OTHER): Payer: Commercial Managed Care - HMO | Admitting: Family Medicine

## 2022-05-26 ENCOUNTER — Encounter: Payer: Self-pay | Admitting: Family Medicine

## 2022-05-26 VITALS — BP 146/90 | HR 65 | Resp 20 | Ht 74.0 in | Wt 242.0 lb

## 2022-05-26 DIAGNOSIS — E782 Mixed hyperlipidemia: Secondary | ICD-10-CM | POA: Diagnosis not present

## 2022-05-26 DIAGNOSIS — I10 Essential (primary) hypertension: Secondary | ICD-10-CM | POA: Diagnosis not present

## 2022-05-26 DIAGNOSIS — R0609 Other forms of dyspnea: Secondary | ICD-10-CM

## 2022-05-26 DIAGNOSIS — R079 Chest pain, unspecified: Secondary | ICD-10-CM

## 2022-05-26 DIAGNOSIS — Z0000 Encounter for general adult medical examination without abnormal findings: Secondary | ICD-10-CM

## 2022-05-26 DIAGNOSIS — R5383 Other fatigue: Secondary | ICD-10-CM

## 2022-05-26 DIAGNOSIS — G479 Sleep disorder, unspecified: Secondary | ICD-10-CM

## 2022-05-26 DIAGNOSIS — E1169 Type 2 diabetes mellitus with other specified complication: Secondary | ICD-10-CM | POA: Diagnosis not present

## 2022-05-26 DIAGNOSIS — C61 Malignant neoplasm of prostate: Secondary | ICD-10-CM

## 2022-05-26 DIAGNOSIS — R0683 Snoring: Secondary | ICD-10-CM

## 2022-05-26 MED ORDER — NITROGLYCERIN 0.4 MG SL SUBL: 0.4000 mg | SUBLINGUAL_TABLET | SUBLINGUAL | 3 refills | Status: AC | PRN

## 2022-05-26 NOTE — Assessment & Plan Note (Signed)
hgba1c acceptable, minimize simple carbs. Increase exercise as tolerated. Continue current meds 

## 2022-05-26 NOTE — Assessment & Plan Note (Signed)
Continues to follow with urology. Still gets up several times a night to urinate but overall doing well in this regard.

## 2022-05-26 NOTE — Assessment & Plan Note (Signed)
He notes over the past couple of months he is experiencing worsening SOB with exertion, on the treadmill at the gym or walking th dog, after a short distance he begins experiencing SOB and chest pressure. With rest it improves but then recurs. He is referred to cardiology and pulmonology for further evaluation and asked to abstain from activities that cause the symptoms until he is seen. He is given NTG SL to use if pain recurs and does not resolve. After 3 tabs as directed he is directed to present to the ER for evaluation. EKG is reviewed with cardiology.

## 2022-05-26 NOTE — Patient Instructions (Addendum)
Shingrix is the new shingles shot, 2 shots over 2-6 months, confirm coverage with insurance and document, then can return here for shots with nurse appt or at pharmacy   Consider new Mounjaro or ozempic for sugar and weight loss.    Preventive Care 58-64 Years Old, Male Preventive care refers to lifestyle choices and visits with your health care provider that can promote health and wellness. Preventive care visits are also called wellness exams. What can I expect for my preventive care visit? Counseling During your preventive care visit, your health care provider may ask about your: Medical history, including: Past medical problems. Family medical history. Current health, including: Emotional well-being. Home life and relationship well-being. Sexual activity. Lifestyle, including: Alcohol, nicotine or tobacco, and drug use. Access to firearms. Diet, exercise, and sleep habits. Safety issues such as seatbelt and bike helmet use. Sunscreen use. Work and work Statistician. Physical exam Your health care provider will check your: Height and weight. These may be used to calculate your BMI (body mass index). BMI is a measurement that tells if you are at a healthy weight. Waist circumference. This measures the distance around your waistline. This measurement also tells if you are at a healthy weight and may help predict your risk of certain diseases, such as type 2 diabetes and high blood pressure. Heart rate and blood pressure. Body temperature. Skin for abnormal spots. What immunizations do I need?  Vaccines are usually given at various ages, according to a schedule. Your health care provider will recommend vaccines for you based on your age, medical history, and lifestyle or other factors, such as travel or where you work. What tests do I need? Screening Your health care provider may recommend screening tests for certain conditions. This may include: Lipid and cholesterol  levels. Diabetes screening. This is done by checking your blood sugar (glucose) after you have not eaten for a while (fasting). Hepatitis B test. Hepatitis C test. HIV (human immunodeficiency virus) test. STI (sexually transmitted infection) testing, if you are at risk. Lung cancer screening. Prostate cancer screening. Colorectal cancer screening. Talk with your health care provider about your test results, treatment options, and if necessary, the need for more tests. Follow these instructions at home: Eating and drinking  Eat a diet that includes fresh fruits and vegetables, whole grains, lean protein, and low-fat dairy products. Take vitamin and mineral supplements as recommended by your health care provider. Do not drink alcohol if your health care provider tells you not to drink. If you drink alcohol: Limit how much you have to 0-2 drinks a day. Know how much alcohol is in your drink. In the [place] one drink equals one 12 oz bottle of beer (355 mL), one 5 oz glass of wine (148 mL), or one 1 oz glass of hard liquor (44 mL). Lifestyle Brush your teeth every morning and night with fluoride toothpaste. Floss one time each day. Exercise for at least 30 minutes 5 or more days each week. Do not use any products that contain nicotine or tobacco. These products include cigarettes, chewing tobacco, and vaping devices, such as e-cigarettes. If you need help quitting, ask your health care provider. Do not use drugs. If you are sexually active, practice safe sex. Use a condom or other form of protection to prevent STIs. Take aspirin only as told by your health care provider. Make sure that you understand how much to take and what form to take. Work with your health care provider to find out whether  it is safe and beneficial for you to take aspirin daily. Find healthy ways to manage stress, such as: Meditation, yoga, or listening to music. Journaling. Talking to a trusted person. Spending time  with friends and family. Minimize exposure to UV radiation to reduce your risk of skin cancer. Safety Always wear your seat belt while driving or riding in a vehicle. Do not drive: If you have been drinking alcohol. Do not ride with someone who has been drinking. When you are tired or distracted. While texting. If you have been using any mind-altering substances or drugs. Wear a helmet and other protective equipment during sports activities. If you have firearms in your house, make sure you follow all gun safety procedures. What's next? Go to your health care provider once a year for an annual wellness visit. Ask your health care provider how often you should have your eyes and teeth checked. Stay up to date on all vaccines. This information is not intended to replace advice given to you by your health care provider. Make sure you discuss any questions you have with your health care provider. Document Revised: 06/09/2021 Document Reviewed: 06/09/2021 Elsevier Patient Education  [place].

## 2022-05-26 NOTE — Assessment & Plan Note (Signed)
Encourage heart healthy diet such as MIND or DASH diet, increase exercise, avoid trans fats, simple carbohydrates and processed foods, consider a krill or fish or flaxseed oil cap daily. Tolerating Lipitor

## 2022-05-26 NOTE — Telephone Encounter (Signed)
Phone call from Dr. Maryellen Pile asked me to review an EKG showing right bundle branch block unchanged from previous She alerted me that the patient is having frequent angina send a note to the front to have them worked in at the first opening with the next doctor in the office

## 2022-05-26 NOTE — Assessment & Plan Note (Signed)
He notes over the past couple of months he is experiencing worsening SOB and chest pressure with exertion, on the treadmill at the gym or walking th dog, after a short distance he begins experiencing SOB and chest pressure. With rest it improves but then recurs. He is referred to cardiology and pulmonology for further evaluation and asked to abstain from activities that cause the symptoms until he is seen. He is given NTG SL to use if pain recurs and does not resolve. After 3 tabs as directed he is directed to present to the ER for evaluation.

## 2022-05-27 ENCOUNTER — Telehealth: Payer: Self-pay | Admitting: Cardiology

## 2022-05-27 LAB — MICROALBUMIN / CREATININE URINE RATIO
Creatinine,U: 178.4 mg/dL
Microalb Creat Ratio: 1.7 mg/g (ref 0.0–30.0)
Microalb, Ur: 3 mg/dL — ABNORMAL HIGH (ref 0.0–1.9)

## 2022-05-27 LAB — CBC
HCT: 41.4 % (ref 39.0–52.0)
Hemoglobin: 14.2 g/dL (ref 13.0–17.0)
MCHC: 34.2 g/dL (ref 30.0–36.0)
MCV: 86.8 fl (ref 78.0–100.0)
Platelets: 238 10*3/uL (ref 150.0–400.0)
RBC: 4.77 Mil/uL (ref 4.22–5.81)
RDW: 13.8 % (ref 11.5–15.5)
WBC: 10.2 10*3/uL (ref 4.0–10.5)

## 2022-05-27 LAB — LDL CHOLESTEROL, DIRECT: Direct LDL: 74 mg/dL

## 2022-05-27 LAB — LIPID PANEL
Cholesterol: 134 mg/dL (ref 0–200)
HDL: 35.1 mg/dL — ABNORMAL LOW (ref >39.00)
NonHDL: 99.17
Total CHOL/HDL Ratio: 4
Triglycerides: 377 mg/dL — ABNORMAL HIGH (ref 0.0–149.0)
VLDL: 75.4 mg/dL — ABNORMAL HIGH (ref 0.0–40.0)

## 2022-05-27 LAB — COMPREHENSIVE METABOLIC PANEL
ALT: 26 U/L (ref 0–53)
AST: 21 U/L (ref 0–37)
Albumin: 4.5 g/dL (ref 3.5–5.2)
Alkaline Phosphatase: 46 U/L (ref 39–117)
BUN: 20 mg/dL (ref 6–23)
CO2: 28 mEq/L (ref 19–32)
Calcium: 9.7 mg/dL (ref 8.4–10.5)
Chloride: 101 mEq/L (ref 96–112)
Creatinine, Ser: 1.01 mg/dL (ref 0.40–1.50)
GFR: 78.78 mL/min (ref >60.00)
Glucose, Bld: 137 mg/dL — ABNORMAL HIGH (ref 70–99)
Potassium: 3.2 mEq/L — ABNORMAL LOW (ref 3.5–5.1)
Sodium: 138 mEq/L (ref 135–145)
Total Bilirubin: 0.9 mg/dL (ref 0.2–1.2)
Total Protein: 7.2 g/dL (ref 6.0–8.3)

## 2022-05-27 LAB — HEMOGLOBIN A1C: Hgb A1c MFr Bld: 6.8 % — ABNORMAL HIGH (ref 4.6–6.5)

## 2022-05-27 LAB — TSH: TSH: 1 u[IU]/mL (ref 0.35–5.50)

## 2022-05-27 NOTE — Telephone Encounter (Signed)
Left message for patient to call back  

## 2022-05-27 NOTE — Assessment & Plan Note (Signed)
Referred to pulmonology for evaluation of symptoms due to restless sleeping, snoring, shortness of breath.

## 2022-05-27 NOTE — Telephone Encounter (Signed)
Patient states that she was returning call. Please advise  

## 2022-05-27 NOTE — Assessment & Plan Note (Signed)
Patient encouraged to maintain heart healthy diet, regular exercise, adequate sleep. Consider daily probiotics. Take medications as prescribed. Labs ordered and reviewed. Encouraged to consider shingrix shots but so far he declines this and covid shots. Will consider Shingrix shots later. Declines colonoscopy for now

## 2022-05-27 NOTE — Telephone Encounter (Signed)
Please see below. Sent to wrong pool

## 2022-05-30 NOTE — Telephone Encounter (Signed)
Left message for patient to call back  

## 2022-05-31 NOTE — Telephone Encounter (Signed)
Left message for the patient to call back.

## 2022-06-01 NOTE — Telephone Encounter (Signed)
After leaving many voice mails with no response a letter was sent to the patient, instructing them that if they have questions about their heart care to please call the office.

## 2022-06-02 NOTE — Progress Notes (Signed)
Cardiology Office Note:   Date:  06/03/2022  NAME:  Joseph Fowler    MRN: [id number] DOB:  Sep 06, 1956   PCP:  Mosie Lukes, MD  Cardiologist:  None  Electrophysiologist:  None   Referring MD: Mosie Lukes, MD   Chief Complaint  Patient presents with   Chest Pain    History of Present Illness:   Joseph Fowler is a 66 y.o. male with a hx of DM, HTN, HLD who is being seen today for the evaluation of CP/SOB at the request of Mosie Lukes, MD. he reports for the last month he has had episodes of exertional shortness of breath and chest discomfort.  He reports pressure in his chest when he walks up to one quarter of a mile.  He reports he cannot go further than that.  Symptoms also occur with walking incline.  They do not occur all the time.  He can walk an incline without symptoms but can have symptoms on other days with walking the same amount of activity.  Symptoms are alarming.  He is able to lift weights without any limitations.  He reports he can do that without issues at all.  This is all coincided with very elevated blood pressure.  Blood pressure today 176/96.  Blood pressure values have been uncontrolled recently.  He is on 4 medications.  He does snore and his wife reports he has stopped breathing at night.  Suspect he has sleep apnea and sleep study is pending.  He is diabetic but has been diabetic for over 1 year.  He has elevated triglycerides.  Exam is normal.  EKG demonstrates sinus rhythm with right bundle branch block and nonspecific ST-T changes.  He was started on nitroglycerin by his primary care physician.  Symptoms resolved within 3 to 4 minutes of cessation of activities and he has not taken any medication.  No strong family history of heart disease.  He continues to work.  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  Most recent A1c 6.8.  Denies any chest pain symptoms in office.  Has not had symptoms at rest.  He is a never smoker.  Alcohol in moderation.  No drug use is  reported.  No signs of congestive heart failure.  Problem List DM -A1c 6.8 HTN HLD -T chol 134, HDL 35, LDL 74, TG 377  Past Medical History: Past Medical History:  Diagnosis Date   Allergy    Arthritis 09/06/2013   Blood in stool 12/03/2012   BPH (benign prostatic hyperplasia) 09/12/2012   Chicken pox as a child   Dermatitis 09/12/2012   Diabetes mellitus without complication [facility])    ED (erectile dysfunction) 09/12/2012   Hyperlipidemia    Hypertension 66 yrs old   Nocturia 09/12/2012   Obesity 09/12/2012   Overweight(278.02) 09/12/2012   PONV (postoperative nausea and vomiting)    Has nausea   Preventative health care 09/12/2012   Prostate cancer ([place])    Snoring 09/12/2012    Past Surgical History: Past Surgical History:  Procedure Laterality Date   APPENDECTOMY  66 yrs old   EYE SURGERY  12/27/2003   ,right, injury, decreased visual acuity, photophobia   HERNIA REPAIR     LYMPHADENECTOMY Bilateral 10/15/2020   Procedure: LYMPHADENECTOMY, PELVIC;  Surgeon: Raynelle Bring, MD;  Location: [facility];  Service: Urology;  Laterality: Bilateral;   PROSTATE BIOPSY     ROBOT ASSISTED LAPAROSCOPIC RADICAL PROSTATECTOMY N/A 10/15/2020   Procedure: XI ROBOTIC ASSISTED LAPAROSCOPIC RADICAL  PROSTATECTOMY LEVEL 2;  Surgeon: Raynelle Bring, MD;  Location: [facility];  Service: Urology;  Laterality: N/A;   torn meninscus  12/26/2009   knee- right   VASECTOMY  66 yrs old    Current Medications: Current Meds  Medication Sig   amLODipine (NORVASC) 5 MG tablet TAKE 1 TABLET BY MOUTH IN THE MORNING AND AT BEDTIME   aspirin EC 81 MG tablet Take 1 tablet (81 mg total) by mouth daily. Swallow whole.   blood glucose meter kit and supplies Dispense based on patient and insurance preference. Use up to four times daily as directed. (FOR ICD-10 E10.9, E11.9).   Blood Glucose Monitoring Suppl (ONETOUCH VERIO) w/Device KIT Use up to four times daily as directed.   fluticasone (FLONASE) 50  MCG/ACT nasal spray Place 2 sprays into both nostrils daily.   glucose blood (ONETOUCH VERIO) test strip Use as instructed   HYDROcodone bit-homatropine (HYCODAN) 5-1.5 MG/5ML syrup Take 5 mLs by mouth every 6 (six) hours as needed for cough.   loratadine (CLARITIN) 10 MG tablet Take 1 tablet (10 mg total) by mouth daily.   losartan (COZAAR) 100 MG tablet TAKE 1 TABLET BY MOUTH ONCE DAILY AT BEDTIME   metFORMIN (GLUCOPHAGE) 500 MG tablet TAKE 1 TABLET BY MOUTH TWICE DAILY WITH A MEAL   metoprolol tartrate (LOPRESSOR) 25 MG tablet Take 1 tablet (25 mg total) by mouth 2 (two) times daily.   nitroGLYCERIN (NITROSTAT) 0.4 MG SL tablet Place 1 tablet (0.4 mg total) under the tongue every 5 (five) minutes as needed for chest pain.   OneTouch Delica Lancets 73X MISC Use as instructed   sildenafil (VIAGRA) 100 MG tablet TAKE 1 TABLET BY MOUTH ONCE DAILY AS NEEDED FOR ERECTILE DYSFUNCTION   triamcinolone (NASACORT) 55 MCG/ACT AERO nasal inhaler Place 1 spray into the nose daily as needed (Allergy).   triamterene-hydrochlorothiazide (MAXZIDE-25) 37.5-25 MG tablet Take 1 tablet by mouth once daily   [DISCONTINUED] atorvastatin (LIPITOR) 20 MG tablet TAKE 1 TABLET BY MOUTH AT BEDTIME     Allergies:    Patient has no known allergies.   Social History: Social History   Socioeconomic History   Marital status: Married    Spouse name: Not on file   Number of children: 6   Years of education: Not on file   Highest education level: Not on file  Occupational History   Occupation: Retired - Warden/ranger  Tobacco Use   Smoking status: Former    Packs/day: 2.00    Years: 4.00    Total pack years: 8.00    Types: Cigarettes    Quit date: 12/26/1981    Years since quitting: 40.4   Smokeless tobacco: Never  Vaping Use   Vaping Use: Never used  Substance and Sexual Activity   Alcohol use: No    Comment: Very rare   Drug use: No   Sexual activity: Yes  Other Topics Concern   Not on file  Social History  Narrative   Not on file   Social Determinants of Health   Financial Resource Strain: Not on file  Food Insecurity: Not on file  Transportation Needs: Not on file  Physical Activity: Not on file  Stress: Not on file  Social Connections: Not on file     Family History: The patient's family history includes Arthritis in his paternal uncle; Dementia in his mother; Diabetes in his father; Heart disease in his brother; Other in his brother; Stroke in his maternal grandmother. There is no  history of Colon cancer.  ROS:   All other ROS reviewed and negative. Pertinent positives noted in the HPI.     EKGs/Labs/Other Studies Reviewed:   The following studies were personally reviewed by me today:  EKG:  EKG is ordered today.  The ekg ordered today demonstrates normal sinus rhythm heart rate 74, right bundle branch block with nonspecific ST-T changes, and was personally reviewed by me.   Recent Labs: 05/26/2022: ALT 26; BUN 20; Creatinine, Ser 1.01; Hemoglobin 14.2; Platelets 238.0; Potassium 3.2; Sodium 138; TSH 1.00   Recent Lipid Panel    Component Value Date/Time   CHOL 134 05/26/2022 1606   TRIG 377.0 (H) 05/26/2022 1606   HDL 35.10 (L) 05/26/2022 1606   CHOLHDL 4 05/26/2022 1606   VLDL 75.4 (H) 05/26/2022 1606   LDLCALC 45 06/30/2020 1031   LDLDIRECT 74.0 05/26/2022 1606    Physical Exam:   VS:  BP (!) 176/96 (BP Location: Left Arm, Patient Position: Sitting, Cuff Size: Large)   Pulse 74   Ht '6\' 2"'  (1.88 m)   Wt 244 lb 6.4 oz (110.9 kg)   SpO2 94%   BMI 31.38 kg/m    Wt Readings from Last 3 Encounters:  06/03/22 244 lb 6.4 oz (110.9 kg)  05/26/22 242 lb (109.8 kg)  11/09/21 251 lb 3.2 oz (113.9 kg)    General: Well nourished, well developed, in no acute distress Head: Atraumatic, normal size  Eyes: PEERLA, EOMI  Neck: Supple, no JVD Endocrine: No thryomegaly Cardiac: Normal S1, S2; RRR; no murmurs, rubs, or gallops Lungs: Clear to auscultation bilaterally, no  wheezing, rhonchi or rales  Abd: Soft, nontender, no hepatomegaly  Ext: No edema, pulses 2+ Musculoskeletal: No deformities, BUE and BLE strength normal and equal Skin: Warm and dry, no rashes   Neuro: Alert and oriented to person, place, time, and situation, CNII-XII grossly intact, no focal deficits  Psych: Normal mood and affect   ASSESSMENT:   Joseph Fowler is a 66 y.o. male who presents for the following: 1. Precordial pain   2. SOB (shortness of breath) on exertion   3. Mixed hyperlipidemia   4. Primary hypertension   5. Snoring   6. Obesity (BMI 30-39.9)     PLAN:   1. Precordial pain 2. SOB (shortness of breath) on exertion -Describes exertional symptoms of chest pressure as well as shortness of breath.  He can complete similar levels of activity and not have the same symptoms.  They do occur with incline or heavy exertion.  He does not have it with lifting weights.  He does have several risk factors for CAD including diabetes, uncontrolled hypertension and obesity.  He was given nitroglycerin but symptoms resolved within 3 to 4 minutes.  I agree with continue with nitroglycerin if needed.  He was given strict return precautions.  He understands if he gets to 3 doses of nitroglycerin and 15 minutes of chest pain with no resolution of symptoms despite 3 to 5 minutes per dose he will go to the emergency room.  He will start aspirin 81 mg daily.  We will start him on metoprolol tartrate 50 mg twice daily.  He is on several blood pressure medications with poor control.  See discussion below.  He also needs an echo.  His EKG demonstrates sinus rhythm with right bundle branch block and nonspecific ST-T changes.  I see no acute changes.  His symptoms appear to be stable if this does represent angina.  We will  set him up for a coronary CTA on Wednesday of next week.  He will also get an echocardiogram.  He will see me back in 6 weeks.  I will notify him of the results and if I need to see him  sooner.  His symptoms could also just be blood pressure related.  Recent kidney profile is normal.  Recent TSH is normal.  CV exam is normal.  3. Mixed hyperlipidemia -Diabetic.  LDL not at goal.  Triglycerides not at goal.  Increase Lipitor to 40 mg daily.  4. Primary hypertension -BP very elevated.  Likely has untreated sleep apnea.  Being worked up by his primary care physician.  Currently on amlodipine 5 mg daily, losartan 100 mg daily, triamterene 37.5, HCTZ 25 mg daily.  Start metoprolol tartrate 50 mg twice daily with concerns for angina.  Would recommend he adhere to a low-salt diet.  We are going to hold on heavy activity until CT scan is done.  5. Obesity, morbid, BMI 31 -Weight loss recommended.  6. Snoring -Worse.  Apparently he stops breathing at night per his wife.  Needs a home sleep study.  This has been ordered by his primary care physician.  Likely contributing to his uncontrolled hypertension.  Disposition: Return in about 6 weeks (around 07/15/2022).  Medication Adjustments/Labs and Tests Ordered: Current medicines are reviewed at length with the patient today.  Concerns regarding medicines are outlined above.  Orders Placed This Encounter  Procedures   CT CORONARY MORPH W/CTA COR W/SCORE W/CA W/CM &/OR WO/CM   EKG 12-Lead   ECHOCARDIOGRAM COMPLETE   Meds ordered this encounter  Medications   aspirin EC 81 MG tablet    Sig: Take 1 tablet (81 mg total) by mouth daily. Swallow whole.    Dispense:  90 tablet    Refill:  3   metoprolol tartrate (LOPRESSOR) 25 MG tablet    Sig: Take 1 tablet (25 mg total) by mouth 2 (two) times daily.    Dispense:  180 tablet    Refill:  3   atorvastatin (LIPITOR) 40 MG tablet    Sig: Take 1 tablet (40 mg total) by mouth at bedtime.    Dispense:  90 tablet    Refill:  1    Patient Instructions  Medication Instructions:  START Aspirin 81 mg daily  INCREASE Lipitor to 40 mg daily  START Metoprolol 25 mg twice daily- take 2  tablets 2 hours before the CT scan when scheduled.   *If you need a refill on your cardiac medications before your next appointment, please call your pharmacy*  Testing/Procedures:  Echocardiogram - Your physician has requested that you have an echocardiogram. Echocardiography is a painless test that uses sound waves to create images of your heart. It provides your doctor with information about the size and shape of your heart and how well your heart's chambers and valves are working. This procedure takes approximately one hour. There are no restrictions for this procedure.   Your physician has requested that you have cardiac CT. Cardiac computed tomography (CT) is a painless test that uses an x-ray machine to take clear, detailed pictures of your heart. For further information please visit [email or web address].[email or web address]. Please follow instruction sheet as given.    Follow-Up: At [facility], you and your health needs are our priority.  As part of our continuing mission to provide you with exceptional heart care, we have created designated Provider Care Teams.  These Care Teams include your primary Cardiologist (physician)  and Advanced Practice Providers (APPs -  Physician Assistants and Nurse Practitioners) who all work together to provide you with the care you need, when you need it.  We recommend signing up for the patient portal called "MyChart".  Sign up information is provided on this After Visit Summary.  MyChart is used to connect with patients for Virtual Visits (Telemedicine).  Patients are able to view lab/test results, encounter notes, upcoming appointments, etc.  Non-urgent messages can be sent to your provider as well.   To learn more about what you can do with MyChart, go to [email or web address].    Your next appointment:   6 week(s)  The format for your next appointment:   In Person  Provider:   Eleonore Chiquito, MD     Your cardiac CT will be scheduled at one of the below  locations:   [facility] [address] [place], [place] [address] [phone]   If scheduled at [facility], please arrive at the [facility] and Children's Entrance (Entrance C2) of [facility] 30 minutes prior to test start time. You can use the FREE valet parking offered at entrance C (encouraged to control the heart rate for the test)  Proceed to the [facility] Radiology Department (first floor) to check-in and test prep.  All radiology patients and guests should use entrance C2 at [facility], accessed from [facility], even though the hospital's physical address listed is [address].      Please follow these instructions carefully (unless otherwise directed):  Hold all erectile dysfunction medications at least 3 days (72 hrs) prior to test.  On the Night Before the Test: Be sure to Drink plenty of water. Do not consume any caffeinated/decaffeinated beverages or chocolate 12 hours prior to your test. Do not take any antihistamines 12 hours prior to your test.  On the Day of the Test: Drink plenty of water until 1 hour prior to the test. Do not eat any food 4 hours prior to the test. You may take your regular medications prior to the test.  Take metoprolol two hours prior to test. HOLD Furosemide/Hydrochlorothiazide morning of the test.      After the Test: Drink plenty of water. After receiving IV contrast, you may experience a mild flushed feeling. This is normal. On occasion, you may experience a mild rash up to 24 hours after the test. This is not dangerous. If this occurs, you can take Benadryl 25 mg and increase your fluid intake. If you experience trouble breathing, this can be serious. If it is severe call 911 IMMEDIATELY. If it is mild, please call our office. If you take any of these medications: Glipizide/Metformin, Avandament, Glucavance, please do not take 48 hours after completing test unless  otherwise instructed.  We will call to schedule your test 2-4 weeks out understanding that some insurance companies will need an authorization prior to the service being performed.   For non-scheduling related questions, please contact the cardiac imaging nurse navigator should you have any questions/concerns: Marchia Bond, Cardiac Imaging Nurse Navigator Gordy Clement, Cardiac Imaging Nurse Navigator [facility] Direct Office Dial: [phone]   For scheduling needs, including cancellations and rescheduling, please call [place], [phone]. g            Signed, [place] T. Audie Box, MD, [place]  [address], [place], [place] [address] [phone]  06/03/2022 11:25 AM

## 2022-06-03 ENCOUNTER — Ambulatory Visit (INDEPENDENT_AMBULATORY_CARE_PROVIDER_SITE_OTHER): Payer: Commercial Managed Care - HMO | Admitting: Cardiovascular Disease

## 2022-06-03 ENCOUNTER — Encounter: Payer: Self-pay | Admitting: Cardiovascular Disease

## 2022-06-03 VITALS — BP 176/96 | HR 74 | Ht 74.0 in | Wt 244.4 lb

## 2022-06-03 DIAGNOSIS — I10 Essential (primary) hypertension: Secondary | ICD-10-CM

## 2022-06-03 DIAGNOSIS — R072 Precordial pain: Secondary | ICD-10-CM

## 2022-06-03 DIAGNOSIS — E782 Mixed hyperlipidemia: Secondary | ICD-10-CM | POA: Diagnosis not present

## 2022-06-03 DIAGNOSIS — R0602 Shortness of breath: Secondary | ICD-10-CM

## 2022-06-03 DIAGNOSIS — E669 Obesity, unspecified: Secondary | ICD-10-CM

## 2022-06-03 DIAGNOSIS — R0683 Snoring: Secondary | ICD-10-CM

## 2022-06-03 MED ORDER — ATORVASTATIN CALCIUM 40 MG PO TABS: 40.0000 mg | ORAL_TABLET | Freq: Every day | ORAL | 1 refills | Status: DC

## 2022-06-03 MED ORDER — ASPIRIN 81 MG PO TBEC
81.0000 mg | DELAYED_RELEASE_TABLET | Freq: Every day | ORAL | 3 refills | Status: AC
Start: 2022-06-03 — End: ?

## 2022-06-03 MED ORDER — METOPROLOL TARTRATE 25 MG PO TABS: 25.0000 mg | ORAL_TABLET | Freq: Two times a day (BID) | ORAL | 3 refills | Status: DC

## 2022-06-03 NOTE — Patient Instructions (Addendum)
Medication Instructions:  START Aspirin 81 mg daily  INCREASE Lipitor to 40 mg daily  START Metoprolol 25 mg twice daily- take 2 tablets 2 hours before the CT scan when scheduled.   *If you need a refill on your cardiac medications before your next appointment, please call your pharmacy*  Testing/Procedures:  Echocardiogram - Your physician has requested that you have an echocardiogram. Echocardiography is a painless test that uses sound waves to create images of your heart. It provides your doctor with information about the size and shape of your heart and how well your heart's chambers and valves are working. This procedure takes approximately one hour. There are no restrictions for this procedure.   Your physician has requested that you have cardiac CT. Cardiac computed tomography (CT) is a painless test that uses an x-ray machine to take clear, detailed pictures of your heart. For further information please visit [email or web address].[email or web address]. Please follow instruction sheet as given.    Follow-Up: At [facility], you and your health needs are our priority.  As part of our continuing mission to provide you with exceptional heart care, we have created designated Provider Care Teams.  These Care Teams include your primary Cardiologist (physician) and Advanced Practice Providers (APPs -  Physician Assistants and Nurse Practitioners) who all work together to provide you with the care you need, when you need it.  We recommend signing up for the patient portal called "MyChart".  Sign up information is provided on this After Visit Summary.  MyChart is used to connect with patients for Virtual Visits (Telemedicine).  Patients are able to view lab/test results, encounter notes, upcoming appointments, etc.  Non-urgent messages can be sent to your provider as well.   To learn more about what you can do with MyChart, go to [email or web address].    Your next appointment:   6 week(s)  The format for your  next appointment:   In Person  Provider:   Eleonore Chiquito, MD     Your cardiac CT will be scheduled at one of the below locations:   [facility] [address] [place], [place] [address] [phone]   If scheduled at [facility], please arrive at the [facility] and Children's Entrance (Entrance C2) of [facility] 30 minutes prior to test start time. You can use the FREE valet parking offered at entrance C (encouraged to control the heart rate for the test)  Proceed to the [facility] Radiology Department (first floor) to check-in and test prep.  All radiology patients and guests should use entrance C2 at [facility], accessed from [facility], even though the hospital's physical address listed is [address].      Please follow these instructions carefully (unless otherwise directed):  Hold all erectile dysfunction medications at least 3 days (72 hrs) prior to test.  On the Night Before the Test: Be sure to Drink plenty of water. Do not consume any caffeinated/decaffeinated beverages or chocolate 12 hours prior to your test. Do not take any antihistamines 12 hours prior to your test.  On the Day of the Test: Drink plenty of water until 1 hour prior to the test. Do not eat any food 4 hours prior to the test. You may take your regular medications prior to the test.  Take metoprolol two hours prior to test. HOLD Furosemide/Hydrochlorothiazide morning of the test.      After the Test: Drink plenty of water. After receiving IV contrast, you may  experience a mild flushed feeling. This is normal. On occasion, you may experience a mild rash up to 24 hours after the test. This is not dangerous. If this occurs, you can take Benadryl 25 mg and increase your fluid intake. If you experience trouble breathing, this can be serious. If it is severe call 911 IMMEDIATELY. If it is mild, please call our office. If you take any of  these medications: Glipizide/Metformin, Avandament, Glucavance, please do not take 48 hours after completing test unless otherwise instructed.  We will call to schedule your test 2-4 weeks out understanding that some insurance companies will need an authorization prior to the service being performed.   For non-scheduling related questions, please contact the cardiac imaging nurse navigator should you have any questions/concerns: Marchia Bond, Cardiac Imaging Nurse Navigator Gordy Clement, Cardiac Imaging Nurse Navigator [facility] Direct Office Dial: [phone]   For scheduling needs, including cancellations and rescheduling, please call [place], [phone]. g

## 2022-06-06 ENCOUNTER — Telehealth (HOSPITAL_COMMUNITY): Payer: Self-pay | Admitting: *Deleted

## 2022-06-06 NOTE — Telephone Encounter (Signed)
Reaching out to patient to offer assistance regarding upcoming cardiac imaging study; pt verbalizes understanding of appt date/time, parking situation and where to check in, pre-test NPO status and medications ordered, and verified current allergies; name and call back number provided for further questions should they arise  Gordy Clement RN Navigator Cardiac Imaging [facility] [phone] office [phone] cell  Patient to take '50mg'$  metoprolol tartrate two hours prior to his cardiac CT scan. He is aware to arrive at 1pm.

## 2022-06-07 ENCOUNTER — Encounter: Payer: Self-pay | Admitting: Family Medicine

## 2022-06-08 ENCOUNTER — Ambulatory Visit (HOSPITAL_COMMUNITY)
Admission: RE | Admit: 2022-06-08 | Discharge: 2022-06-08 | Disposition: A | Discharge status: Home / Self Care | Payer: Commercial Managed Care - HMO | Source: Ambulatory Visit | Attending: Cardiovascular Disease | Admitting: Cardiovascular Disease

## 2022-06-08 DIAGNOSIS — R931 Abnormal findings on diagnostic imaging of heart and coronary circulation: Secondary | ICD-10-CM | POA: Diagnosis not present

## 2022-06-08 DIAGNOSIS — R072 Precordial pain: Secondary | ICD-10-CM | POA: Diagnosis present

## 2022-06-08 MED ORDER — NITROGLYCERIN 0.4 MG SL SUBL
0.8000 mg | SUBLINGUAL_TABLET | Freq: Once | SUBLINGUAL | Status: AC
  Administered 2022-06-08: 0.8 mg via SUBLINGUAL

## 2022-06-08 MED ORDER — NITROGLYCERIN 0.4 MG SL SUBL
SUBLINGUAL_TABLET | SUBLINGUAL | Status: AC
  Filled 2022-06-08: qty 2

## 2022-06-08 MED ORDER — IOHEXOL 350 MG/ML SOLN
100.0000 mL | Freq: Once | INTRAVENOUS | Status: AC | PRN
  Administered 2022-06-08: 100 mL via INTRAVENOUS

## 2022-06-09 ENCOUNTER — Telehealth: Payer: Self-pay | Admitting: Cardiovascular Disease

## 2022-06-09 NOTE — Telephone Encounter (Signed)
Patient is calling to receive his CT results

## 2022-06-09 NOTE — Telephone Encounter (Signed)
Called pt to let him know Dr. Audie Box has not reviewed his results yet. Once he does we will get back to him. He verbalized understanding.

## 2022-06-10 ENCOUNTER — Other Ambulatory Visit: Payer: Self-pay | Admitting: Cardiology

## 2022-06-10 ENCOUNTER — Ambulatory Visit (HOSPITAL_COMMUNITY)
Admission: RE | Admit: 2022-06-10 | Discharge: 2022-06-10 | Disposition: A | Discharge status: Home / Self Care | Payer: Commercial Managed Care - HMO | Source: Ambulatory Visit | Attending: Cardiology | Admitting: Cardiology

## 2022-06-10 ENCOUNTER — Ambulatory Visit (HOSPITAL_BASED_OUTPATIENT_CLINIC_OR_DEPARTMENT_OTHER)
Admission: RE | Admit: 2022-06-10 | Discharge: 2022-06-10 | Disposition: A | Discharge status: Home / Self Care | Payer: Commercial Managed Care - HMO | Source: Ambulatory Visit | Attending: Cardiology | Admitting: Cardiology

## 2022-06-10 DIAGNOSIS — I2510 Atherosclerotic heart disease of native coronary artery without angina pectoris: Secondary | ICD-10-CM

## 2022-06-10 DIAGNOSIS — R931 Abnormal findings on diagnostic imaging of heart and coronary circulation: Secondary | ICD-10-CM

## 2022-06-12 ENCOUNTER — Other Ambulatory Visit: Payer: Self-pay | Admitting: Family Medicine

## 2022-06-13 ENCOUNTER — Encounter: Payer: Self-pay | Admitting: Cardiovascular Disease

## 2022-06-16 ENCOUNTER — Ambulatory Visit: Payer: Commercial Managed Care - HMO | Admitting: Cardiovascular Disease

## 2022-06-17 ENCOUNTER — Ambulatory Visit (HOSPITAL_COMMUNITY): Payer: Commercial Managed Care - HMO | Attending: Cardiology

## 2022-06-17 DIAGNOSIS — R072 Precordial pain: Secondary | ICD-10-CM | POA: Insufficient documentation

## 2022-06-17 LAB — ECHOCARDIOGRAM COMPLETE
Area-P 1/2: 3.17 cm2
S' Lateral: 2.7 cm

## 2022-06-22 ENCOUNTER — Encounter: Payer: Self-pay | Admitting: Medical

## 2022-06-22 NOTE — Progress Notes (Unsigned)
Cardiology Office Note:   Date:  06/23/2022  NAME:  Joseph Fowler    MRN: [id number] DOB:  11/15/1956   PCP:  Mosie Lukes, MD  Cardiologist:  None  Electrophysiologist:  None   Referring MD: Mosie Lukes, MD   Chief Complaint  Patient presents with   Follow-up        History of Present Illness:   Joseph Fowler is a 66 y.o. male with a hx of CAD, hypertension, hyperlipidemia, diabetes who presents for follow-up.  Evaluated for chest pain.  Coronary CTA abnormal.  Presents to discuss left heart catheterization. Reports he is still getting CP. Getting it with less activity. Took NTG yesterday with improvement. We discussed CCTA results. Given high calcium score. Would recommend LHC. Willing to proceed. BP improved. Plans for sleep study.   Problem List DM -A1c 6.8 HTN HLD -T chol 134, HDL 35, LDL 74, TG 377 4. Calcium score 7238 -concerns for 3v CAD -mid LAD positive FFR  Past Medical History: Past Medical History:  Diagnosis Date   Allergy    Arthritis 09/06/2013   Blood in stool 12/03/2012   BPH (benign prostatic hyperplasia) 09/12/2012   Chicken pox as a child   Dermatitis 09/12/2012   Diabetes mellitus without complication [facility])    ED (erectile dysfunction) 09/12/2012   Hyperlipidemia    Hypertension 66 yrs old   Nocturia 09/12/2012   Obesity 09/12/2012   Overweight(278.02) 09/12/2012   PONV (postoperative nausea and vomiting)    Has nausea   Preventative health care 09/12/2012   Prostate cancer ([place])    Snoring 09/12/2012    Past Surgical History: Past Surgical History:  Procedure Laterality Date   APPENDECTOMY  66 yrs old   EYE SURGERY  12/27/2003   ,right, injury, decreased visual acuity, photophobia   HERNIA REPAIR     LYMPHADENECTOMY Bilateral 10/15/2020   Procedure: LYMPHADENECTOMY, PELVIC;  Surgeon: Raynelle Bring, MD;  Location: [facility];  Service: Urology;  Laterality: Bilateral;   PROSTATE BIOPSY     ROBOT ASSISTED LAPAROSCOPIC RADICAL  PROSTATECTOMY N/A 10/15/2020   Procedure: XI ROBOTIC ASSISTED LAPAROSCOPIC RADICAL PROSTATECTOMY LEVEL 2;  Surgeon: Raynelle Bring, MD;  Location: [facility];  Service: Urology;  Laterality: N/A;   torn meninscus  12/26/2009   knee- right   VASECTOMY  66 yrs old    Current Medications: No outpatient medications have been marked as taking for the 06/23/22 encounter (Office Visit) with Geralynn Rile, MD.     Allergies:    Patient has no known allergies.   Social History: Social History   Socioeconomic History   Marital status: Married    Spouse name: Not on file   Number of children: 6   Years of education: Not on file   Highest education level: Not on file  Occupational History   Occupation: Retired - Warden/ranger  Tobacco Use   Smoking status: Former    Packs/day: 2.00    Years: 4.00    Total pack years: 8.00    Types: Cigarettes    Quit date: 12/26/1981    Years since quitting: 40.5   Smokeless tobacco: Never  Vaping Use   Vaping Use: Never used  Substance and Sexual Activity   Alcohol use: No    Comment: Very rare   Drug use: No   Sexual activity: Yes  Other Topics Concern   Not on file  Social History Narrative   Not on file   Social Determinants of  Health   Financial Resource Strain: Not on file  Food Insecurity: Not on file  Transportation Needs: Not on file  Physical Activity: Not on file  Stress: Not on file  Social Connections: Not on file     Family History: The patient's family history includes Arthritis in his paternal uncle; Dementia in his mother; Diabetes in his father; Heart disease in his brother; Other in his brother; Stroke in his maternal grandmother. There is no history of Colon cancer.  ROS:   All other ROS reviewed and negative. Pertinent positives noted in the HPI.     EKGs/Labs/Other Studies Reviewed:   The following studies were personally reviewed by me today:  EKG:  EKG is ordered today.  The ekg ordered today demonstrates  normal sinus rhythm right bundle branch block, no acute ischemic changes, and was personally reviewed by me.   TTE 06/17/2022  1. Left ventricular ejection fraction, by estimation, is 60 to 65%. The  left ventricle has normal function. The left ventricle has no regional  wall motion abnormalities. There is mild asymmetric left ventricular  hypertrophy of the basal-septal segment.  Left ventricular diastolic parameters are consistent with Grade I  diastolic dysfunction (impaired relaxation).   2. Right ventricular systolic function is normal. The right ventricular  size is normal. There is normal pulmonary artery systolic pressure.   3. Left atrial size was moderately dilated.   4. Right atrial size was mildly dilated.   5. The mitral valve is degenerative. No evidence of mitral valve  regurgitation. No evidence of mitral stenosis.   6. The aortic valve is tricuspid. There is mild calcification of the  aortic valve. Aortic valve regurgitation is not visualized. Aortic valve  sclerosis/calcification is present, without any evidence of aortic  stenosis.   7. Aortic dilatation noted. There is mild dilatation of the ascending  aorta, measuring 41 mm.   Recent Labs: 05/26/2022: ALT 26; BUN 20; Creatinine, Ser 1.01; Hemoglobin 14.2; Platelets 238.0; Potassium 3.2; Sodium 138; TSH 1.00   Recent Lipid Panel    Component Value Date/Time   CHOL 134 05/26/2022 1606   TRIG 377.0 (H) 05/26/2022 1606   HDL 35.10 (L) 05/26/2022 1606   CHOLHDL 4 05/26/2022 1606   VLDL 75.4 (H) 05/26/2022 1606   LDLCALC 45 06/30/2020 1031   LDLDIRECT 74.0 05/26/2022 1606    Physical Exam:   VS:  BP (!) 146/82   Pulse 67   Ht '6\' 2"'$  (1.88 m)   Wt 249 lb 3.2 oz (113 kg)   SpO2 97%   BMI 32.00 kg/m    Wt Readings from Last 3 Encounters:  06/23/22 249 lb 3.2 oz (113 kg)  06/03/22 244 lb 6.4 oz (110.9 kg)  05/26/22 242 lb (109.8 kg)    General: Well nourished, well developed, in no acute distress Head:  Atraumatic, normal size  Eyes: PEERLA, EOMI  Neck: Supple, no JVD Endocrine: No thryomegaly Cardiac: Normal S1, S2; RRR; no murmurs, rubs, or gallops Lungs: Clear to auscultation bilaterally, no wheezing, rhonchi or rales  Abd: Soft, nontender, no hepatomegaly  Ext: No edema, pulses 2+ Musculoskeletal: No deformities, BUE and BLE strength normal and equal Skin: Warm and dry, no rashes   Neuro: Alert and oriented to person, place, time, and situation, CNII-XII grossly intact, no focal deficits  Psych: Normal mood and affect   ASSESSMENT:   Joseph Fowler is a 66 y.o. male who presents for the following: 1. Coronary artery disease of native artery of  native heart with stable angina pectoris ([place])   2. Mixed hyperlipidemia   3. Pre-procedure lab exam    PLAN:   1. Coronary artery disease of native artery of native heart with stable angina pectoris ([place]) 2. Mixed hyperlipidemia 3. Pre-procedure lab exam -with exertional CP. Symptoms were stable. Now with symptoms with less activity. Concerning for unstable angina. On ASA. Lipitor increased. On BB. CCTA with calcium score 7000. FFR positive for mid LAD.  Would recommend to proceed with LHC. Will plan for next week. Return precautions discussed. Echo normal. Will see me back after LHC.   Shared Decision Making/Informed Consent The risks [stroke (1 in 1000), death (1 in 1000), kidney failure [usually temporary] (1 in 500), bleeding (1 in 200), allergic reaction [possibly serious] (1 in 200)], benefits (diagnostic support and management of coronary artery disease) and alternatives of a cardiac catheterization were discussed in detail with Mr. Bloor and he is willing to proceed.  Disposition: Return in about 1 month (around 07/23/2022).  Medication Adjustments/Labs and Tests Ordered: Current medicines are reviewed at length with the patient today.  Concerns regarding medicines are outlined above.  Orders Placed This Encounter  Procedures    Basic metabolic panel   CBC   EKG 12-Lead   No orders of the defined types were placed in this encounter.   Patient Instructions  Medication Instructions:  Your Physician recommend you continue on your current medication as directed.    *If you need a refill on your cardiac medications before your next appointment, please call your pharmacy*   Lab Work: Your physician recommends lab work today (CBC, BMP)  If you have labs (blood work) drawn today and your tests are completely normal, you will receive your results only by: [place] (if you have MyChart) OR A paper copy in the mail If you have any lab test that is abnormal or we need to change your treatment, we will call you to review the results.   Testing/Procedures: Your physician has requested that you have a cardiac catheterization. Cardiac catheterization is used to diagnose and/or treat various heart conditions. Doctors may recommend this procedure for a number of different reasons. The most common reason is to evaluate chest pain. Chest pain can be a symptom of coronary artery disease (CAD), and cardiac catheterization can show whether plaque is narrowing or blocking your heart's arteries. This procedure is also used to evaluate the valves, as well as measure the blood flow and oxygen levels in different parts of your heart. For further information please visit [email or web address]. Please follow instruction sheet, as given. [facility]    Follow-Up: At [facility], you and your health needs are our priority.  As part of our continuing mission to provide you with exceptional heart care, we have created designated Provider Care Teams.  These Care Teams include your primary Cardiologist (physician) and Advanced Practice Providers (APPs -  Physician Assistants and Nurse Practitioners) who all work together to provide you with the care you need, when you need it.  We recommend signing up for the patient portal  called "MyChart".  Sign up information is provided on this After Visit Summary.  MyChart is used to connect with patients for Virtual Visits (Telemedicine).  Patients are able to view lab/test results, encounter notes, upcoming appointments, etc.  Non-urgent messages can be sent to your provider as well.   To learn more about what you can do with MyChart, go to [email or web address].    Your next appointment:  3 week(s)  The format for your next appointment:   In Person  Provider:   Dr. Currie Paris GROUP [facility] CARDIOVASCULAR DIVISION [facility] [place] [address] Dept: [phone] Loc: [place]  06/23/2022  You are scheduled for a Cardiac Catheterization on Thursday, July 6 with Dr. Lauree Chandler.  1. Please arrive at the Main Entrance A at [facility]: [place], [place] [address] at 5:30 AM (This time is two hours before your procedure to ensure your preparation). Free valet parking service is available.   Special note: Every effort is made to have your procedure done on time. Please understand that emergencies sometimes delay scheduled procedures.  2. Diet: Do not eat solid foods after midnight.  You may have clear liquids until 5 AM upon the day of the procedure.  3. Labs: You will need to have blood drawn on today (BMP, CBC)  4. Medication instructions in preparation for your procedure:   Contrast Allergy: No  Hold Triamterene-Hydrochlorothiazide morning of procedure   Do not take Diabetes Med Glucophage (Metformin) on the day of the procedure and HOLD 48 HOURS AFTER THE PROCEDURE.  On the morning of your procedure, take Aspirin and any morning medicines NOT listed above.  You may use sips of water.  5. Plan to go home the same day, you will only stay overnight if medically necessary. 6. You MUST have a responsible adult to drive you home. 7. An adult  MUST be with you the first 24 hours after you arrive home. 8. Bring a current list of your medications, and the last time and date medication taken. 9. Bring ID and current insurance cards. 10.Please wear clothes that are easy to get on and off and wear slip-on shoes.  Thank you for allowing [place] to care for you!   -- [facility]             Time Spent with Patient: I have spent a total of 35 minutes with patient reviewing hospital notes, telemetry, EKGs, labs and examining the patient as well as establishing an assessment and plan that was discussed with the patient.  > 50% of time was spent in direct patient care.  Signed, Addison Naegeli. Audie Box, MD, [place]  [address], [place], [place] [address] [phone]  06/23/2022 5:14 PM

## 2022-06-22 NOTE — H&P (View-Only) (Signed)
Cardiology Office Note:   Date:  06/23/2022  NAME:  Joseph Fowler    MRN: [id number] DOB:  11/15/1956   PCP:  Mosie Lukes, MD  Cardiologist:  None  Electrophysiologist:  None   Referring MD: Mosie Lukes, MD   Chief Complaint  Patient presents with   Follow-up        History of Present Illness:   Joseph Fowler is a 66 y.o. male with a hx of CAD, hypertension, hyperlipidemia, diabetes who presents for follow-up.  Evaluated for chest pain.  Coronary CTA abnormal.  Presents to discuss left heart catheterization. Reports he is still getting CP. Getting it with less activity. Took NTG yesterday with improvement. We discussed CCTA results. Given high calcium score. Would recommend LHC. Willing to proceed. BP improved. Plans for sleep study.   Problem List DM -A1c 6.8 HTN HLD -T chol 134, HDL 35, LDL 74, TG 377 4. Calcium score 7238 -concerns for 3v CAD -mid LAD positive FFR  Past Medical History: Past Medical History:  Diagnosis Date   Allergy    Arthritis 09/06/2013   Blood in stool 12/03/2012   BPH (benign prostatic hyperplasia) 09/12/2012   Chicken pox as a child   Dermatitis 09/12/2012   Diabetes mellitus without complication [facility])    ED (erectile dysfunction) 09/12/2012   Hyperlipidemia    Hypertension 66 yrs old   Nocturia 09/12/2012   Obesity 09/12/2012   Overweight(278.02) 09/12/2012   PONV (postoperative nausea and vomiting)    Has nausea   Preventative health care 09/12/2012   Prostate cancer ([place])    Snoring 09/12/2012    Past Surgical History: Past Surgical History:  Procedure Laterality Date   APPENDECTOMY  66 yrs old   EYE SURGERY  12/27/2003   ,right, injury, decreased visual acuity, photophobia   HERNIA REPAIR     LYMPHADENECTOMY Bilateral 10/15/2020   Procedure: LYMPHADENECTOMY, PELVIC;  Surgeon: Raynelle Bring, MD;  Location: [facility];  Service: Urology;  Laterality: Bilateral;   PROSTATE BIOPSY     ROBOT ASSISTED LAPAROSCOPIC RADICAL  PROSTATECTOMY N/A 10/15/2020   Procedure: XI ROBOTIC ASSISTED LAPAROSCOPIC RADICAL PROSTATECTOMY LEVEL 2;  Surgeon: Raynelle Bring, MD;  Location: [facility];  Service: Urology;  Laterality: N/A;   torn meninscus  12/26/2009   knee- right   VASECTOMY  66 yrs old    Current Medications: No outpatient medications have been marked as taking for the 06/23/22 encounter (Office Visit) with Geralynn Rile, MD.     Allergies:    Patient has no known allergies.   Social History: Social History   Socioeconomic History   Marital status: Married    Spouse name: Not on file   Number of children: 6   Years of education: Not on file   Highest education level: Not on file  Occupational History   Occupation: Retired - Warden/ranger  Tobacco Use   Smoking status: Former    Packs/day: 2.00    Years: 4.00    Total pack years: 8.00    Types: Cigarettes    Quit date: 12/26/1981    Years since quitting: 40.5   Smokeless tobacco: Never  Vaping Use   Vaping Use: Never used  Substance and Sexual Activity   Alcohol use: No    Comment: Very rare   Drug use: No   Sexual activity: Yes  Other Topics Concern   Not on file  Social History Narrative   Not on file   Social Determinants of  Health   Financial Resource Strain: Not on file  Food Insecurity: Not on file  Transportation Needs: Not on file  Physical Activity: Not on file  Stress: Not on file  Social Connections: Not on file     Family History: The patient's family history includes Arthritis in his paternal uncle; Dementia in his mother; Diabetes in his father; Heart disease in his brother; Other in his brother; Stroke in his maternal grandmother. There is no history of Colon cancer.  ROS:   All other ROS reviewed and negative. Pertinent positives noted in the HPI.     EKGs/Labs/Other Studies Reviewed:   The following studies were personally reviewed by me today:  EKG:  EKG is ordered today.  The ekg ordered today demonstrates  normal sinus rhythm right bundle branch block, no acute ischemic changes, and was personally reviewed by me.   TTE 06/17/2022  1. Left ventricular ejection fraction, by estimation, is 60 to 65%. The  left ventricle has normal function. The left ventricle has no regional  wall motion abnormalities. There is mild asymmetric left ventricular  hypertrophy of the basal-septal segment.  Left ventricular diastolic parameters are consistent with Grade I  diastolic dysfunction (impaired relaxation).   2. Right ventricular systolic function is normal. The right ventricular  size is normal. There is normal pulmonary artery systolic pressure.   3. Left atrial size was moderately dilated.   4. Right atrial size was mildly dilated.   5. The mitral valve is degenerative. No evidence of mitral valve  regurgitation. No evidence of mitral stenosis.   6. The aortic valve is tricuspid. There is mild calcification of the  aortic valve. Aortic valve regurgitation is not visualized. Aortic valve  sclerosis/calcification is present, without any evidence of aortic  stenosis.   7. Aortic dilatation noted. There is mild dilatation of the ascending  aorta, measuring 41 mm.   Recent Labs: 05/26/2022: ALT 26; BUN 20; Creatinine, Ser 1.01; Hemoglobin 14.2; Platelets 238.0; Potassium 3.2; Sodium 138; TSH 1.00   Recent Lipid Panel    Component Value Date/Time   CHOL 134 05/26/2022 1606   TRIG 377.0 (H) 05/26/2022 1606   HDL 35.10 (L) 05/26/2022 1606   CHOLHDL 4 05/26/2022 1606   VLDL 75.4 (H) 05/26/2022 1606   LDLCALC 45 06/30/2020 1031   LDLDIRECT 74.0 05/26/2022 1606    Physical Exam:   VS:  BP (!) 146/82   Pulse 67   Ht '6\' 2"'$  (1.88 m)   Wt 249 lb 3.2 oz (113 kg)   SpO2 97%   BMI 32.00 kg/m    Wt Readings from Last 3 Encounters:  06/23/22 249 lb 3.2 oz (113 kg)  06/03/22 244 lb 6.4 oz (110.9 kg)  05/26/22 242 lb (109.8 kg)    General: Well nourished, well developed, in no acute distress Head:  Atraumatic, normal size  Eyes: PEERLA, EOMI  Neck: Supple, no JVD Endocrine: No thryomegaly Cardiac: Normal S1, S2; RRR; no murmurs, rubs, or gallops Lungs: Clear to auscultation bilaterally, no wheezing, rhonchi or rales  Abd: Soft, nontender, no hepatomegaly  Ext: No edema, pulses 2+ Musculoskeletal: No deformities, BUE and BLE strength normal and equal Skin: Warm and dry, no rashes   Neuro: Alert and oriented to person, place, time, and situation, CNII-XII grossly intact, no focal deficits  Psych: Normal mood and affect   ASSESSMENT:   Joseph Fowler is a 66 y.o. male who presents for the following: 1. Coronary artery disease of native artery of  native heart with stable angina pectoris ([place])   2. Mixed hyperlipidemia   3. Pre-procedure lab exam    PLAN:   1. Coronary artery disease of native artery of native heart with stable angina pectoris ([place]) 2. Mixed hyperlipidemia 3. Pre-procedure lab exam -with exertional CP. Symptoms were stable. Now with symptoms with less activity. Concerning for unstable angina. On ASA. Lipitor increased. On BB. CCTA with calcium score 7000. FFR positive for mid LAD.  Would recommend to proceed with LHC. Will plan for next week. Return precautions discussed. Echo normal. Will see me back after LHC.   Shared Decision Making/Informed Consent The risks [stroke (1 in 1000), death (1 in 1000), kidney failure [usually temporary] (1 in 500), bleeding (1 in 200), allergic reaction [possibly serious] (1 in 200)], benefits (diagnostic support and management of coronary artery disease) and alternatives of a cardiac catheterization were discussed in detail with Mr. Bloor and he is willing to proceed.  Disposition: Return in about 1 month (around 07/23/2022).  Medication Adjustments/Labs and Tests Ordered: Current medicines are reviewed at length with the patient today.  Concerns regarding medicines are outlined above.  Orders Placed This Encounter  Procedures    Basic metabolic panel   CBC   EKG 12-Lead   No orders of the defined types were placed in this encounter.   Patient Instructions  Medication Instructions:  Your Physician recommend you continue on your current medication as directed.    *If you need a refill on your cardiac medications before your next appointment, please call your pharmacy*   Lab Work: Your physician recommends lab work today (CBC, BMP)  If you have labs (blood work) drawn today and your tests are completely normal, you will receive your results only by: [place] (if you have MyChart) OR A paper copy in the mail If you have any lab test that is abnormal or we need to change your treatment, we will call you to review the results.   Testing/Procedures: Your physician has requested that you have a cardiac catheterization. Cardiac catheterization is used to diagnose and/or treat various heart conditions. Doctors may recommend this procedure for a number of different reasons. The most common reason is to evaluate chest pain. Chest pain can be a symptom of coronary artery disease (CAD), and cardiac catheterization can show whether plaque is narrowing or blocking your heart's arteries. This procedure is also used to evaluate the valves, as well as measure the blood flow and oxygen levels in different parts of your heart. For further information please visit HugeFiesta.[email or web address]. Please follow instruction sheet, as given. [facility]    Follow-Up: At [facility], you and your health needs are our priority.  As part of our continuing mission to provide you with exceptional heart care, we have created designated Provider Care Teams.  These Care Teams include your primary Cardiologist (physician) and Advanced Practice Providers (APPs -  Physician Assistants and Nurse Practitioners) who all work together to provide you with the care you need, when you need it.  We recommend signing up for the patient portal  called "MyChart".  Sign up information is provided on this After Visit Summary.  MyChart is used to connect with patients for Virtual Visits (Telemedicine).  Patients are able to view lab/test results, encounter notes, upcoming appointments, etc.  Non-urgent messages can be sent to your provider as well.   To learn more about what you can do with MyChart, go to [email or web address].    Your next appointment:  3 week(s)  The format for your next appointment:   In Person  Provider:   Dr. Currie Paris GROUP [facility] CARDIOVASCULAR DIVISION [facility] [place] [address] Dept: [phone] Loc: [place]  06/23/2022  You are scheduled for a Cardiac Catheterization on Thursday, July 6 with Dr. Lauree Chandler.  1. Please arrive at the Main Entrance A at [facility]: [place], [place] [address] at 5:30 AM (This time is two hours before your procedure to ensure your preparation). Free valet parking service is available.   Special note: Every effort is made to have your procedure done on time. Please understand that emergencies sometimes delay scheduled procedures.  2. Diet: Do not eat solid foods after midnight.  You may have clear liquids until 5 AM upon the day of the procedure.  3. Labs: You will need to have blood drawn on today (BMP, CBC)  4. Medication instructions in preparation for your procedure:   Contrast Allergy: No  Hold Triamterene-Hydrochlorothiazide morning of procedure   Do not take Diabetes Med Glucophage (Metformin) on the day of the procedure and HOLD 48 HOURS AFTER THE PROCEDURE.  On the morning of your procedure, take Aspirin and any morning medicines NOT listed above.  You may use sips of water.  5. Plan to go home the same day, you will only stay overnight if medically necessary. 6. You MUST have a responsible adult to drive you home. 7. An adult  MUST be with you the first 24 hours after you arrive home. 8. Bring a current list of your medications, and the last time and date medication taken. 9. Bring ID and current insurance cards. 10.Please wear clothes that are easy to get on and off and wear slip-on shoes.  Thank you for allowing [place] to care for you!   -- [place] [facility]             Time Spent with Patient: I have spent a total of 35 minutes with patient reviewing hospital notes, telemetry, EKGs, labs and examining the patient as well as establishing an assessment and plan that was discussed with the patient.  > 50% of time was spent in direct patient care.  Signed, Addison Naegeli. Audie Box, MD, [place]  [address], [place], [place] [address] [phone]  06/23/2022 5:14 PM

## 2022-06-23 ENCOUNTER — Ambulatory Visit: Payer: Commercial Managed Care - HMO | Admitting: Cardiovascular Disease

## 2022-06-23 ENCOUNTER — Encounter: Payer: Self-pay | Admitting: Cardiovascular Disease

## 2022-06-23 ENCOUNTER — Encounter: Payer: Self-pay | Admitting: *Deleted

## 2022-06-23 VITALS — BP 146/82 | HR 67 | Ht 74.0 in | Wt 249.2 lb

## 2022-06-23 DIAGNOSIS — E782 Mixed hyperlipidemia: Secondary | ICD-10-CM

## 2022-06-23 DIAGNOSIS — Z01812 Encounter for preprocedural laboratory examination: Secondary | ICD-10-CM | POA: Diagnosis not present

## 2022-06-23 DIAGNOSIS — I25118 Atherosclerotic heart disease of native coronary artery with other forms of angina pectoris: Secondary | ICD-10-CM

## 2022-06-23 NOTE — Patient Instructions (Signed)
Medication Instructions:  Your Physician recommend you continue on your current medication as directed.    *If you need a refill on your cardiac medications before your next appointment, please call your pharmacy*   Lab Work: Your physician recommends lab work today (CBC, BMP)  If you have labs (blood work) drawn today and your tests are completely normal, you will receive your results only by: [place] (if you have MyChart) OR A paper copy in the mail If you have any lab test that is abnormal or we need to change your treatment, we will call you to review the results.   Testing/Procedures: Your physician has requested that you have a cardiac catheterization. Cardiac catheterization is used to diagnose and/or treat various heart conditions. Doctors may recommend this procedure for a number of different reasons. The most common reason is to evaluate chest pain. Chest pain can be a symptom of coronary artery disease (CAD), and cardiac catheterization can show whether plaque is narrowing or blocking your heart's arteries. This procedure is also used to evaluate the valves, as well as measure the blood flow and oxygen levels in different parts of your heart. For further information please visit HugeFiesta.[email or web address]. Please follow instruction sheet, as given. [facility]    Follow-Up: At [facility], you and your health needs are our priority.  As part of our continuing mission to provide you with exceptional heart care, we have created designated Provider Care Teams.  These Care Teams include your primary Cardiologist (physician) and Advanced Practice Providers (APPs -  Physician Assistants and Nurse Practitioners) who all work together to provide you with the care you need, when you need it.  We recommend signing up for the patient portal called "MyChart".  Sign up information is provided on this After Visit Summary.  MyChart is used to connect with patients for Virtual Visits  (Telemedicine).  Patients are able to view lab/test results, encounter notes, upcoming appointments, etc.  Non-urgent messages can be sent to your provider as well.   To learn more about what you can do with MyChart, go to [email or web address].    Your next appointment:   3 week(s)  The format for your next appointment:   In Person  Provider:   Dr. Currie Paris GROUP [facility] CARDIOVASCULAR DIVISION [facility] [place] [address] Dept: [place]: [place]  06/23/2022  You are scheduled for a Cardiac Catheterization on Thursday, July 6 with Dr. Lauree Chandler.  1. Please arrive at the Main Entrance A at [facility]: [place], [place] [address] at 5:30 AM (This time is two hours before your procedure to ensure your preparation). Free valet parking service is available.   Special note: Every effort is made to have your procedure done on time. Please understand that emergencies sometimes delay scheduled procedures.  2. Diet: Do not eat solid foods after midnight.  You may have clear liquids until 5 AM upon the day of the procedure.  3. Labs: You will need to have blood drawn on today (BMP, CBC)  4. Medication instructions in preparation for your procedure:   Contrast Allergy: No  Hold Triamterene-Hydrochlorothiazide morning of procedure   Do not take Diabetes Med Glucophage (Metformin) on the day of the procedure and HOLD 48 HOURS AFTER THE PROCEDURE.  On the morning of your procedure, take Aspirin and any morning medicines NOT listed above.  You may use  sips of water.  5. Plan to go home the same day, you will only stay overnight if medically necessary. 6. You MUST have a responsible adult to drive you home. 7. An adult MUST be with you the first 24 hours after you arrive home. 8. Bring a current list of your medications, and the last time and date medication  taken. 9. Bring ID and current insurance cards. 10.Please wear clothes that are easy to get on and off and wear slip-on shoes.  Thank you for allowing [place] to care for you!   -- [place] [facility]

## 2022-06-24 ENCOUNTER — Encounter: Payer: Self-pay | Admitting: Pulmonary Disease

## 2022-06-24 ENCOUNTER — Ambulatory Visit (INDEPENDENT_AMBULATORY_CARE_PROVIDER_SITE_OTHER): Payer: Commercial Managed Care - HMO | Admitting: Pulmonary Disease

## 2022-06-24 VITALS — BP 124/76 | HR 74 | Ht 74.0 in | Wt 247.0 lb

## 2022-06-24 DIAGNOSIS — R0683 Snoring: Secondary | ICD-10-CM | POA: Diagnosis not present

## 2022-06-24 LAB — CBC
Hematocrit: 42.3 % (ref 37.5–51.0)
Hemoglobin: 14 g/dL (ref 13.0–17.7)
MCH: 28.6 pg (ref 26.6–33.0)
MCHC: 33.1 g/dL (ref 31.5–35.7)
MCV: 87 fL (ref 79–97)
Platelets: 252 10*3/uL (ref 150–450)
RBC: 4.89 x10E6/uL (ref 4.14–5.80)
RDW: 13.2 % (ref 11.6–15.4)
WBC: 9.5 10*3/uL (ref 3.4–10.8)

## 2022-06-24 LAB — BASIC METABOLIC PANEL
BUN/Creatinine Ratio: 25 — ABNORMAL HIGH (ref 10–24)
BUN: 24 mg/dL (ref 8–27)
CO2: 22 mmol/L (ref 20–29)
Calcium: 10.2 mg/dL (ref 8.6–10.2)
Chloride: 99 mmol/L (ref 96–106)
Creatinine, Ser: 0.96 mg/dL (ref 0.76–1.27)
Glucose: 119 mg/dL — ABNORMAL HIGH (ref 70–99)
Potassium: 3.6 mmol/L (ref 3.5–5.2)
Sodium: 139 mmol/L (ref 134–144)
eGFR: 88 mL/min/{1.73_m2} (ref >59)

## 2022-06-24 MED ORDER — DOXYCYCLINE HYCLATE 100 MG PO TABS: 100.0000 mg | ORAL_TABLET | Freq: Two times a day (BID) | ORAL | 0 refills | Status: DC

## 2022-06-24 NOTE — Patient Instructions (Signed)
I will see you back in about 3 months  We will set you up with a home sleep study Update your results as soon as reviewed  Continue weight loss efforts  Call us with significant concerns  Graded exercise as tolerated

## 2022-06-24 NOTE — Addendum Note (Signed)
Addended by: Anabel Halon on: 06/24/2022 08:27 AM   Modules accepted: Orders

## 2022-06-24 NOTE — Progress Notes (Signed)
Joseph Fowler    [id number]    26-Apr-1956  Primary Care Physician:Blyth, Bonnita Levan, MD  Referring Physician: Mosie Lukes, MD [address] [place],  [place] [id number]  Chief complaint:   Nonrestorative sleep Daytime sleepiness  HPI:  Patient being seen for daytime sleepiness Longstanding history of snoring Witnessed apneas Snoring is loud enough to have his spouse move into another room Occasional night sweats No morning headaches He does have dryness of his mouth in the morning Usually goes to bed between 1030 and 11 Falls asleep quickly on most days 5-6 awakenings, almost always go use the bathroom Final wake up time about 8 AM  Does not feel well rested  He does consume caffeinated beverages during the day Usually 1 cup of coffee in the morning, may take an energy drink during the day  His memory is good  Reformed smoker about a pack a day quit in 1983 He did work in a [employer] for about 3 years when he was much younger 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 Swallow whole.   atorvastatin (LIPITOR) 40 MG tablet Take 1 tablet (40 mg total) by mouth at bedtime.   blood glucose meter kit and supplies Dispense based on patient and insurance preference. Use up to four times daily as directed. (FOR ICD-10 E10.9, E11.9).   Blood Glucose Monitoring Suppl (ONETOUCH VERIO) w/Device KIT Use up to four times daily as directed.   doxycycline (VIBRA-TABS) 100 MG tablet Take 1 tablet (100 mg total) by mouth 2 (two) times daily.   fluticasone (FLONASE) 50 MCG/ACT nasal spray Place 2 sprays into both nostrils daily.   glucose blood (ONETOUCH VERIO) test strip Use as instructed   loratadine  (CLARITIN) 10 MG tablet Take 1 tablet (10 mg total) by mouth daily.   losartan (COZAAR) 100 MG tablet TAKE 1 TABLET BY MOUTH ONCE DAILY AT BEDTIME   metFORMIN (GLUCOPHAGE) 500 MG tablet TAKE 1 TABLET BY MOUTH TWICE DAILY WITH A MEAL   metoprolol tartrate (LOPRESSOR) 25 MG tablet Take 1 tablet (25 mg total) by mouth 2 (two) times daily.   nitroGLYCERIN (NITROSTAT) 0.4 MG SL tablet Place 1 tablet (0.4 mg total) under the tongue every 5 (five) minutes as needed for chest pain.   OneTouch Delica Lancets 19J MISC Use as instructed   sildenafil (VIAGRA) 100 MG tablet TAKE 1 TABLET BY MOUTH ONCE DAILY AS NEEDED FOR ERECTILE DYSFUNCTION   triamcinolone (NASACORT) 55 MCG/ACT AERO nasal inhaler Place 1 spray into the nose daily as needed (Allergy).   triamterene-hydrochlorothiazide (MAXZIDE-25) 37.5-25 MG tablet Take 1 tablet by mouth once daily   HYDROcodone bit-homatropine (HYCODAN) 5-1.5 MG/5ML syrup Take 5 mLs by mouth every 6 (six) hours as needed for cough. (Patient not taking: Reported on 06/24/2022)   No facility-administered encounter medications on file as of 06/24/2022.    Allergies as of 06/24/2022   (No Known Allergies)    Past Medical History:  Diagnosis Date   Allergy    Arthritis 09/06/2013   Blood in stool 12/03/2012   BPH (benign prostatic hyperplasia) 09/12/2012   Chicken pox as a  child   Dermatitis 09/12/2012   Diabetes mellitus without complication [facility])    ED (erectile dysfunction) 09/12/2012   Hyperlipidemia    Hypertension 66 yrs old   Nocturia 09/12/2012   Obesity 09/12/2012   Overweight(278.02) 09/12/2012   PONV (postoperative nausea and vomiting)    Has nausea   Preventative health care 09/12/2012   Prostate cancer [facility])    Snoring 09/12/2012    Past Surgical History:  Procedure Laterality Date   APPENDECTOMY  66 yrs old   EYE SURGERY  12/27/2003   ,right, injury, decreased visual acuity, photophobia   HERNIA REPAIR     LYMPHADENECTOMY Bilateral 10/15/2020    Procedure: LYMPHADENECTOMY, PELVIC;  Surgeon: Raynelle Bring, MD;  Location: [facility];  Service: Urology;  Laterality: Bilateral;   PROSTATE BIOPSY     ROBOT ASSISTED LAPAROSCOPIC RADICAL PROSTATECTOMY N/A 10/15/2020   Procedure: XI ROBOTIC ASSISTED LAPAROSCOPIC RADICAL PROSTATECTOMY LEVEL 2;  Surgeon: Raynelle Bring, MD;  Location: [facility];  Service: Urology;  Laterality: N/A;   torn meninscus  12/26/2009   knee- right   VASECTOMY  66 yrs old    Family History  Problem Relation Age of Onset   Dementia Mother    Diabetes Father        type 2   Other Brother        whole in heart   Heart disease Brother        sudden cardiac death   Arthritis Paternal Uncle    Stroke Maternal Grandmother        X 2   Colon cancer Neg Hx     Social History   Socioeconomic History   Marital status: Married    Spouse name: Not on file   Number of children: 6   Years of education: Not on file   Highest education level: Not on file  Occupational History   Occupation: Retired - Warden/ranger  Tobacco Use   Smoking status: Former    Packs/day: 2.00    Years: 4.00    Total pack years: 8.00    Types: Cigarettes    Quit date: 12/26/1981    Years since quitting: 40.5   Smokeless tobacco: Never  Vaping Use   Vaping Use: Never used  Substance and Sexual Activity   Alcohol use: No    Comment: Very rare   Drug use: No   Sexual activity: Yes  Other Topics Concern   Not on file  Social History Narrative   Not on file   Social Determinants of Health   Financial Resource Strain: Not on file  Food Insecurity: Not on file  Transportation Needs: Not on file  Physical Activity: Not on file  Stress: Not on file  Social Connections: Not on file  Intimate Partner Violence: Not on file    Review of Systems  Psychiatric/Behavioral:  Positive for sleep disturbance.     Vitals:   06/24/22 1500  BP: 124/76  Pulse: 74  SpO2: 96%     Physical Exam Constitutional:      Appearance: He is obese.   HENT:     Head: Normocephalic.     Mouth/Throat:     Mouth: Mucous membranes are moist.  Eyes:     Pupils: Pupils are equal, round, and reactive to light.  Cardiovascular:     Rate and Rhythm: Normal rate and regular rhythm.     Heart sounds: No murmur heard.    No friction rub.  Pulmonary:  Effort: No respiratory distress.     Breath sounds: No stridor. No wheezing or rhonchi.  Musculoskeletal:     Cervical back: No rigidity or tenderness.  Neurological:     Mental Status: He is alert.  Psychiatric:        Mood and Affect: Mood normal.       06/24/2022    3:00 PM  Results of the Epworth flowsheet  Sitting and reading 2  Watching TV 0  Sitting, inactive in a public place (e.g. a theatre or a meeting) 0  As a passenger in a car for an hour without a break 3  Lying down to rest in the afternoon when circumstances permit 3  Sitting and talking to someone 0  Sitting quietly after a lunch without alcohol 0  In a car, while stopped for a few minutes in traffic 2  Total score 10     Data Reviewed: No previous sleep study available  Assessment:  Moderate probability of significant obstructive sleep apnea  Excessive daytime sleepiness  Nonrestorative sleep  Multiple awakenings  Class I obesity  Pathophysiology of sleep disordered breathing reviewed with the patient Treatment options for sleep disordered breathing discussed with the patient  Plan/Recommendations: Schedule patient for home sleep study  Encourage weight loss efforts  Graded exercise and weight loss efforts encouraged  Follow-up in 3 months  Sherrilyn Rist MD [facility] 06/24/2022, 3:07 PM  CC: Mosie Lukes, MD

## 2022-06-29 ENCOUNTER — Telehealth: Payer: Self-pay | Admitting: *Deleted

## 2022-06-29 NOTE — Telephone Encounter (Signed)
Cardiac Catheterization scheduled at [facility] for: Thursday June 30, 2022 7:30 AM Arrival time and place: [place] Entrance A at: 5:30 AM   Nothing to eat after midnight prior to procedure, clear liquids until 5 AM day of procedure.  Medication instructions: -Hold:  Metformin-day of procedure and 48 hours post procedure  Triamterene/HCT-AM of procedure  -Except hold medications usual morning medications can be taken with sips of water including aspirin 81 mg.  Confirmed patient has responsible adult to drive home post procedure and be with patient first 24 hours after arriving home.  Patient reports no new symptoms concerning for COVID-19 in the past 10 days.  Left message for patient to call back to review procedure instructions.

## 2022-06-29 NOTE — Telephone Encounter (Signed)
Patient reports, starting this morning, he has new symptoms of head/nasal congestion/scratchy/sore throat, dry cough, denies fever. Pt reports over the past several days he has been around a group of people that have had similar symptoms, doesn't know if any have been diagnosed with COVID, his son going to doctor today for evaluation of similar upper respiratory symptoms. After discussion with patient, cardiac cath has been rescheduled to 07/07/22 with Dr Angelena Form 7:30 AM, arrive 5:30 AM. Patient will plan to take it easy, not do any strenuous activity, knows to seek medical care for concerning cardiac symptoms.

## 2022-06-29 NOTE — Telephone Encounter (Signed)
Pt returning nurses call regarding instructions. Please advise

## 2022-07-05 ENCOUNTER — Telehealth: Payer: Self-pay | Admitting: *Deleted

## 2022-07-05 NOTE — Telephone Encounter (Signed)
Cardiac Catheterization scheduled at [facility] for: Thursday July 07, 2022 7:30 AM Arrival time and place: [place] Entrance A at: 5:30 AM   Nothing to eat after midnight prior to procedure, clear liquids until 5 AM day of procedure.  Medication instructions: -Hold:  Metformin-day of procedure and 48 hours post procedure  Triamterene/HCT-AM of procedure -Except hold medication usual morning medications can be taken with sips of water including aspirin 81 mg.  Confirmed patient has responsible adult to drive home post procedure and be with patient first 24 hours after arriving home.  Patient reports no new symptoms concerning for COVID-19 in the past 10 days-pt denies any current upper respiratory/cough symptoms-previous symptoms have resolved.

## 2022-07-07 ENCOUNTER — Encounter: Payer: Self-pay | Admitting: Cardiovascular Disease

## 2022-07-07 ENCOUNTER — Other Ambulatory Visit: Payer: Self-pay

## 2022-07-07 ENCOUNTER — Ambulatory Visit (HOSPITAL_COMMUNITY)
Admission: RE | Admit: 2022-07-07 | Discharge: 2022-07-07 | Disposition: A | Discharge status: Home / Self Care | Payer: Commercial Managed Care - HMO | Attending: Cardiovascular Disease | Admitting: Cardiovascular Disease

## 2022-07-07 ENCOUNTER — Encounter (HOSPITAL_COMMUNITY)
Admission: RE | Disposition: A | Discharge status: Home / Self Care | Payer: Self-pay | Source: Home / Self Care | Attending: Cardiovascular Disease

## 2022-07-07 DIAGNOSIS — Z87891 Personal history of nicotine dependence: Secondary | ICD-10-CM | POA: Diagnosis not present

## 2022-07-07 DIAGNOSIS — I25110 Atherosclerotic heart disease of native coronary artery with unstable angina pectoris: Secondary | ICD-10-CM | POA: Insufficient documentation

## 2022-07-07 DIAGNOSIS — I2584 Coronary atherosclerosis due to calcified coronary lesion: Secondary | ICD-10-CM | POA: Insufficient documentation

## 2022-07-07 DIAGNOSIS — E119 Type 2 diabetes mellitus without complications: Secondary | ICD-10-CM | POA: Insufficient documentation

## 2022-07-07 DIAGNOSIS — E782 Mixed hyperlipidemia: Secondary | ICD-10-CM | POA: Diagnosis not present

## 2022-07-07 DIAGNOSIS — Z7982 Long term (current) use of aspirin: Secondary | ICD-10-CM | POA: Insufficient documentation

## 2022-07-07 DIAGNOSIS — I10 Essential (primary) hypertension: Secondary | ICD-10-CM | POA: Insufficient documentation

## 2022-07-07 DIAGNOSIS — Z79899 Other long term (current) drug therapy: Secondary | ICD-10-CM | POA: Diagnosis not present

## 2022-07-07 DIAGNOSIS — I200 Unstable angina: Secondary | ICD-10-CM

## 2022-07-07 HISTORY — PX: LEFT HEART CATH AND CORONARY ANGIOGRAPHY: CATH118249

## 2022-07-07 LAB — GLUCOSE, CAPILLARY
Glucose-Capillary: 143 mg/dL — ABNORMAL HIGH (ref 70–99)
Glucose-Capillary: 147 mg/dL — ABNORMAL HIGH (ref 70–99)

## 2022-07-07 SURGERY — LEFT HEART CATH AND CORONARY ANGIOGRAPHY
Anesthesia: LOCAL

## 2022-07-07 MED ORDER — SODIUM CHLORIDE 0.9% FLUSH: 3.0000 mL | INTRAVENOUS | Status: DC | PRN

## 2022-07-07 MED ORDER — SODIUM CHLORIDE 0.9% FLUSH
3.0000 mL | INTRAVENOUS | Status: DC | PRN
Start: 2022-07-07 — End: 2022-07-07

## 2022-07-07 MED ORDER — LIDOCAINE HCL (PF) 1 % IJ SOLN
INTRAMUSCULAR | Status: AC
  Filled 2022-07-07: qty 30

## 2022-07-07 MED ORDER — ASPIRIN 81 MG PO CHEW: 81.0000 mg | CHEWABLE_TABLET | ORAL | Status: DC

## 2022-07-07 MED ORDER — SODIUM CHLORIDE 0.9 % IV SOLN: 250.0000 mL | INTRAVENOUS | Status: DC | PRN

## 2022-07-07 MED ORDER — VERAPAMIL HCL 2.5 MG/ML IV SOLN
INTRAVENOUS | Status: DC | PRN
  Administered 2022-07-07: 10 mL via INTRA_ARTERIAL

## 2022-07-07 MED ORDER — SODIUM CHLORIDE 0.9% FLUSH: 3.0000 mL | Freq: Two times a day (BID) | INTRAVENOUS | Status: DC

## 2022-07-07 MED ORDER — LIDOCAINE HCL (PF) 1 % IJ SOLN
INTRAMUSCULAR | Status: DC | PRN
  Administered 2022-07-07: 2 mL

## 2022-07-07 MED ORDER — SODIUM CHLORIDE 0.9 % WEIGHT BASED INFUSION: 1.0000 mL/kg/h | INTRAVENOUS | Status: DC

## 2022-07-07 MED ORDER — HEPARIN (PORCINE) IN NACL 1000-0.9 UT/500ML-% IV SOLN
INTRAVENOUS | Status: DC | PRN
  Administered 2022-07-07 (×2): 500 mL

## 2022-07-07 MED ORDER — MIDAZOLAM HCL 2 MG/2ML IJ SOLN
INTRAMUSCULAR | Status: DC | PRN
  Administered 2022-07-07: 2 mg via INTRAVENOUS

## 2022-07-07 MED ORDER — HEPARIN SODIUM (PORCINE) 1000 UNIT/ML IJ SOLN
INTRAMUSCULAR | Status: DC | PRN
  Administered 2022-07-07: 6000 [IU] via INTRAVENOUS

## 2022-07-07 MED ORDER — SODIUM CHLORIDE 0.9 % WEIGHT BASED INFUSION
3.0000 mL/kg/h | INTRAVENOUS | Status: AC
  Administered 2022-07-07: 3 mL/kg/h via INTRAVENOUS

## 2022-07-07 MED ORDER — HYDRALAZINE HCL 20 MG/ML IJ SOLN: 10.0000 mg | INTRAMUSCULAR | Status: DC | PRN

## 2022-07-07 MED ORDER — IOHEXOL 350 MG/ML SOLN
INTRAVENOUS | Status: DC | PRN
  Administered 2022-07-07: 40 mL

## 2022-07-07 MED ORDER — MIDAZOLAM HCL 2 MG/2ML IJ SOLN
INTRAMUSCULAR | Status: AC
  Filled 2022-07-07: qty 2

## 2022-07-07 MED ORDER — VERAPAMIL HCL 2.5 MG/ML IV SOLN
INTRAVENOUS | Status: AC
  Filled 2022-07-07: qty 2

## 2022-07-07 MED ORDER — ONDANSETRON HCL 4 MG/2ML IJ SOLN: 4.0000 mg | Freq: Four times a day (QID) | INTRAMUSCULAR | Status: DC | PRN

## 2022-07-07 MED ORDER — FENTANYL CITRATE (PF) 100 MCG/2ML IJ SOLN
INTRAMUSCULAR | Status: AC
  Filled 2022-07-07: qty 2

## 2022-07-07 MED ORDER — HEPARIN SODIUM (PORCINE) 1000 UNIT/ML IJ SOLN
INTRAMUSCULAR | Status: AC
  Filled 2022-07-07: qty 10

## 2022-07-07 MED ORDER — FENTANYL CITRATE (PF) 100 MCG/2ML IJ SOLN
INTRAMUSCULAR | Status: DC | PRN
  Administered 2022-07-07: 50 ug via INTRAVENOUS

## 2022-07-07 MED ORDER — SODIUM CHLORIDE 0.9 % IV SOLN
INTRAVENOUS | Status: AC
Start: 2022-07-07 — End: 2022-07-07

## 2022-07-07 MED ORDER — LABETALOL HCL 5 MG/ML IV SOLN: 10.0000 mg | INTRAVENOUS | Status: DC | PRN

## 2022-07-07 MED ORDER — ACETAMINOPHEN 325 MG PO TABS: 650.0000 mg | ORAL_TABLET | ORAL | Status: DC | PRN

## 2022-07-07 SURGICAL SUPPLY — 10 items
BAND CMPR LRG ZPHR (HEMOSTASIS) ×1
BAND ZEPHYR COMPRESS 30 LONG (HEMOSTASIS) ×1 IMPLANT
CATH 5FR JL3.5 JR4 ANG PIG MP (CATHETERS) ×1 IMPLANT
GLIDESHEATH SLEND SS 6F .021 (SHEATH) ×1 IMPLANT
GUIDEWIRE INQWIRE 1.5J.035X260 (WIRE) IMPLANT
INQWIRE 1.5J .035X260CM (WIRE) ×2
KIT HEART LEFT (KITS) ×3 IMPLANT
PACK CARDIAC CATHETERIZATION (CUSTOM PROCEDURE TRAY) ×3 IMPLANT
TRANSDUCER W/STOPCOCK (MISCELLANEOUS) ×3 IMPLANT
TUBING CIL FLEX 10 FLL-RA (TUBING) ×3 IMPLANT

## 2022-07-07 NOTE — Interval H&P Note (Signed)
History and Physical Interval Note:  07/07/2022 7:12 AM  Joseph Fowler  has presented today for surgery, with the diagnosis of abnormal cta.  The various methods of treatment have been discussed with the patient and family. After consideration of risks, benefits and other options for treatment, the patient has consented to  Procedure(s): LEFT HEART CATH AND CORONARY ANGIOGRAPHY (N/A) as a surgical intervention.  The patient's history has been reviewed, patient examined, no change in status, stable for surgery.  I have reviewed the patient's chart and labs.  Questions were answered to the patient's satisfaction.    Cath Lab Visit (complete for each Cath Lab visit)  Clinical Evaluation Leading to the Procedure:   ACS: No.  Non-ACS:    Anginal Classification: CCS III  Anti-ischemic medical therapy: Maximal Therapy (2 or more classes of medications)  Non-Invasive Test Results: High-risk stress test findings: cardiac mortality >3%/year (coronary CTA with multi-vessel CAD)  Prior CABG: No previous CABG        Lauree Chandler

## 2022-07-07 NOTE — Discharge Instructions (Signed)
Hold metformin 48 hours post cath

## 2022-07-08 ENCOUNTER — Encounter (HOSPITAL_COMMUNITY): Payer: Self-pay | Admitting: Cardiovascular Disease

## 2022-07-11 ENCOUNTER — Telehealth: Payer: Self-pay | Admitting: Cardiovascular Disease

## 2022-07-11 ENCOUNTER — Encounter: Payer: Self-pay | Admitting: Medical

## 2022-07-11 DIAGNOSIS — I2510 Atherosclerotic heart disease of native coronary artery without angina pectoris: Secondary | ICD-10-CM

## 2022-07-11 NOTE — Telephone Encounter (Signed)
Referral placed-ok per MD (see mychart message).  Will fax referral to [facility]

## 2022-07-11 NOTE — Telephone Encounter (Signed)
New Messaage:      Patient's wife called. She said they still would like for Dr Audie Box to refer patient to Dr Druscilla Brownie at [facility] please.

## 2022-07-11 NOTE — Telephone Encounter (Signed)
Thank you :)

## 2022-07-22 ENCOUNTER — Encounter: Payer: Commercial Managed Care - HMO | Admitting: Thoracic Surgery (Cardiothoracic Vascular Surgery)

## 2022-07-23 ENCOUNTER — Encounter: Payer: Self-pay | Admitting: Cardiovascular Disease

## 2022-07-26 ENCOUNTER — Ambulatory Visit: Payer: Commercial Managed Care - HMO | Admitting: Cardiovascular Disease

## 2022-08-03 ENCOUNTER — Encounter: Payer: Self-pay | Admitting: Family Medicine

## 2022-08-03 ENCOUNTER — Encounter: Payer: Self-pay | Admitting: Medical

## 2022-08-04 ENCOUNTER — Inpatient Hospital Stay: Payer: Commercial Managed Care - HMO | Admitting: Family

## 2022-08-04 NOTE — Telephone Encounter (Signed)
Nurse Assessment Nurse: Ellery Plunk, RN, [place] Date/Time (Eastern Time): 08/03/2022 12:44:40 PM Confirm and document reason for call. If symptomatic, describe symptoms. ---Caller states his eye is red, swollen, and has eye discharge. Crusted shut when he woke up. Discharge is yellow. Denies fever at this time. Just had triple bypass surgery less than 10 days ago. at sons house and cant go anywhere. Does the patient have any new or worsening symptoms? ---Yes Will a triage be completed? ---Yes Related visit to physician within the last 2 weeks? ---No Does the PT have any chronic conditions? (i.e. diabetes, asthma, this includes High risk factors for pregnancy, etc.) ---Yes List chronic conditions. ---heart blockage Is this a behavioral health or substance abuse call? ---No Guidelines Guideline Title Affirmed Question Affirmed Notes Nurse Date/Time Eilene Ghazi Time) Eye - Pus or Discharge Blurred vision Bringas, RN, Fredric Dine 08/03/2022 12:46:11 PM PLEASE NOTE: All timestamps contained within this report are represented as [place] Standard Time. CONFIDENTIALTY NOTICE: This fax transmission is intended only for the addressee. It contains information that is legally privileged, confidential or otherwise protected from use or disclosure. If you are not the intended recipient, you are strictly prohibited from reviewing, disclosing, copying using or disseminating any of this information or taking any action in reliance on or regarding this information. If you have received this fax in error, please notify us immediately by telephone so that we can arrange for its return to [place]. Phone: [phone], Toll-Free: [phone], Fax: [phone] Page: 2 of 2 Call Id: [id number] [place]. Time Eilene Ghazi Time) Disposition Final User 08/03/2022 12:30:11 PM Attempt made - message left Bringas, RN, Fredric Dine 08/03/2022 12:47:25 PM See HCP within 4 Hours (or PCP triage) Yes Ellery Plunk, RN, [place] Final Disposition 08/03/2022  12:47:25 PM See HCP within 4 Hours (or PCP triage) Yes Bringas, RN, Fredric Dine Caller Disagree/Comply Disagree Caller Understands Yes PreDisposition Call Doctor Care Advice Given Per Guideline SEE HCP (OR PCP TRIAGE) WITHIN 4 HOURS: * IF OFFICE WILL BE OPEN: You need to be seen within the next 3 or 4 hours. Call your doctor (or NP/PA) now or as soon as the office opens. CARE ADVICE given per Eye - Pus or Discharge (Adult) guideline. Comments User: Harriette Bouillon, RN Date/Time Eilene Ghazi Time): 08/03/2022 12:53:52 PM caller states he is recovering from surgery and cannot go anywhere - attempted to reach office backline multiple times with no response. told caller to call back in 30 min- 1 hr to see if they can be reached then, might be on lunch Referrals [place] REFUSED

## 2022-08-05 ENCOUNTER — Ambulatory Visit: Payer: Commercial Managed Care - HMO | Admitting: Cardiology

## 2022-08-16 ENCOUNTER — Encounter: Payer: Self-pay | Admitting: Family Medicine

## 2022-08-17 ENCOUNTER — Other Ambulatory Visit: Payer: Self-pay

## 2022-08-17 ENCOUNTER — Other Ambulatory Visit: Payer: Self-pay | Admitting: Family Medicine

## 2022-08-17 ENCOUNTER — Encounter: Payer: Self-pay | Admitting: Pulmonary Disease

## 2022-08-17 MED ORDER — DIAZEPAM 10 MG PO TABS: 5.0000 mg | ORAL_TABLET | Freq: Every evening | ORAL | 1 refills | Status: DC | PRN

## 2022-08-17 NOTE — Telephone Encounter (Signed)
Called pt was advised  

## 2022-08-22 ENCOUNTER — Other Ambulatory Visit: Payer: Self-pay | Admitting: Family Medicine

## 2022-08-22 MED ORDER — ZOLPIDEM TARTRATE 10 MG PO TABS: 5.0000 mg | ORAL_TABLET | Freq: Every evening | ORAL | 1 refills | Status: DC | PRN

## 2022-09-14 ENCOUNTER — Encounter: Payer: Self-pay | Admitting: Cardiovascular Disease

## 2022-09-14 MED ORDER — METOPROLOL TARTRATE 25 MG PO TABS: 25.0000 mg | ORAL_TABLET | Freq: Two times a day (BID) | ORAL | 3 refills | Status: DC

## 2022-09-14 MED ORDER — METOPROLOL TARTRATE 25 MG PO TABS: 37.5000 mg | ORAL_TABLET | Freq: Two times a day (BID) | ORAL | 3 refills | Status: DC

## 2022-09-15 ENCOUNTER — Ambulatory Visit: Payer: Commercial Managed Care - HMO | Admitting: Family Medicine

## 2022-09-22 ENCOUNTER — Ambulatory Visit: Payer: Commercial Managed Care - HMO | Admitting: Pulmonary Disease

## 2022-09-23 NOTE — Progress Notes (Unsigned)
Cardiology Office Note:    Date:  09/23/2022   ID:  Joseph Fowler, DOB [birth year], MRN [id number]  PCP:  Mosie Lukes, MD   [place] Providers Cardiologist:  None { Click to update primary MD,subspecialty MD or APP then REFRESH:1}    Referring MD: Mosie Lukes, MD   No chief complaint on file. ***  History of Present Illness:    Joseph Fowler is a 66 y.o. male with a hx of CAD s/p CABG, hypertension, hyperlipidemia, DM, and hyperlipidemia.  He underwent L HC following an abnormal coronary CTA performed for chest pain.  Coronary angiography revealed severe diffuse multivessel CAD with heavily calcified coronary arteries.  In the setting of diabetes, he was referred to CT surgery for evaluation for CABG revascularization.  He opted to have the surgery completed at [facility]: CABG x 3 on 07/22/22.    He presents today for hospital follow up.      CAD s/p CABG x 3  At [facility] 07/22/22 Continue ?325 mg ASA Continue BB and statin   Hypertension    Hyperlipidemia with LDL goal < 70 05/26/2022: Cholesterol 134; HDL 35.10; Triglycerides 377.0; VLDL 75.4 Consider repeating this ?Vascepa for triglycerides ?40 mg lipitor   DM Consider SGLT2i       Past Medical History:  Diagnosis Date   Allergy    Arthritis 09/06/2013   Blood in stool 12/03/2012   BPH (benign prostatic hyperplasia) 09/12/2012   Chicken pox as a child   Dermatitis 09/12/2012   Diabetes mellitus without complication [facility])    ED (erectile dysfunction) 09/12/2012   Hyperlipidemia    Hypertension 66 yrs old   Nocturia 09/12/2012   Obesity 09/12/2012   Overweight(278.02) 09/12/2012   PONV (postoperative nausea and vomiting)    Has nausea   Preventative health care 09/12/2012   Prostate cancer ([place])    Snoring 09/12/2012    Past Surgical History:  Procedure Laterality Date   APPENDECTOMY  66 yrs old   EYE SURGERY  12/27/2003   ,right, injury, decreased visual acuity,  photophobia   HERNIA REPAIR     LEFT HEART CATH AND CORONARY ANGIOGRAPHY N/A 07/07/2022   Procedure: LEFT HEART CATH AND CORONARY ANGIOGRAPHY;  Surgeon: Burnell Blanks, MD;  Location: [place] [facility];  Service: Cardiovascular;  Laterality: N/A;   LYMPHADENECTOMY Bilateral 10/15/2020   Procedure: LYMPHADENECTOMY, PELVIC;  Surgeon: Raynelle Bring, MD;  Location: [facility];  Service: Urology;  Laterality: Bilateral;   PROSTATE BIOPSY     ROBOT ASSISTED LAPAROSCOPIC RADICAL PROSTATECTOMY N/A 10/15/2020   Procedure: XI ROBOTIC ASSISTED LAPAROSCOPIC RADICAL PROSTATECTOMY LEVEL 2;  Surgeon: Raynelle Bring, MD;  Location: [facility];  Service: Urology;  Laterality: N/A;   torn meninscus  12/26/2009   knee- right   VASECTOMY  66 yrs old    Current Medications: No outpatient medications have been marked as taking for the 09/26/22 encounter (Appointment) with Ledora Bottcher, [place].     Allergies:   Patient has no known allergies.   Social History   Socioeconomic History   Marital status: Married    Spouse name: Not on file   Number of children: 6   Years of education: Not on file   Highest education level: Not on file  Occupational History   Occupation: Retired - Warden/ranger  Tobacco Use   Smoking status: Former    Packs/day: 2.00    Years: 4.00    Total pack years: 8.00  Types: Cigarettes    Quit date: 12/26/1981    Years since quitting: 40.7   Smokeless tobacco: Never  Vaping Use   Vaping Use: Never used  Substance and Sexual Activity   Alcohol use: No    Comment: Very rare   Drug use: No   Sexual activity: Yes  Other Topics Concern   Not on file  Social History Narrative   Not on file   Social Determinants of Health   Financial Resource Strain: Not on file  Food Insecurity: Not on file  Transportation Needs: Not on file  Physical Activity: Not on file  Stress: Not on file  Social Connections: Not on file     Family History: The patient's ***family history  includes Arthritis in his paternal uncle; Dementia in his mother; Diabetes in his father; Heart disease in his brother; Other in his brother; Stroke in his maternal grandmother. There is no history of Colon cancer.  ROS:   Please see the history of present illness.    *** All other systems reviewed and are negative.  EKGs/Labs/Other Studies Reviewed:    The following studies were reviewed today: ***  EKG:  EKG is *** ordered today.  The ekg ordered today demonstrates ***  Recent Labs: 05/26/2022: ALT 26; TSH 1.00 06/23/2022: BUN 24; Creatinine, Ser 0.96; Hemoglobin 14.0; Platelets 252; Potassium 3.6; Sodium 139  Recent Lipid Panel    Component Value Date/Time   CHOL 134 05/26/2022 1606   TRIG 377.0 (H) 05/26/2022 1606   HDL 35.10 (L) 05/26/2022 1606   CHOLHDL 4 05/26/2022 1606   VLDL 75.4 (H) 05/26/2022 1606   LDLCALC 45 06/30/2020 1031   LDLDIRECT 74.0 05/26/2022 1606     Risk Assessment/Calculations:   {Does this patient have ATRIAL FIBRILLATION?:[phone]}  No BP recorded.  {Refresh Note OR Click here to enter BP  :1}***         Physical Exam:    VS:  There were no vitals taken for this visit.    Wt Readings from Last 3 Encounters:  07/07/22 240 lb (108.9 kg)  06/24/22 247 lb (112 kg)  06/23/22 249 lb 3.2 oz (113 kg)     GEN: *** Well nourished, well developed in no acute distress HEENT: Normal NECK: No JVD; No carotid bruits LYMPHATICS: No lymphadenopathy CARDIAC: ***RRR, no murmurs, rubs, gallops RESPIRATORY:  Clear to auscultation without rales, wheezing or rhonchi  ABDOMEN: Soft, non-tender, non-distended MUSCULOSKELETAL:  No edema; No deformity  SKIN: Warm and dry NEUROLOGIC:  Alert and oriented x 3 PSYCHIATRIC:  Normal affect   ASSESSMENT:    No diagnosis found. PLAN:    In order of problems listed above:  ***  {The patient has an active order for outpatient cardiac rehabilitation.   Please indicate if the patient is ready to start. Do NOT  delete this.  It will auto delete.  Refresh note, then sign.              Click here to document readiness and see contraindications.  :1}  Cardiac Rehabilitation Eligibility Assessment       {Are you ordering a CV Procedure (e.g. stress test, cath, DCCV, TEE, etc)?   Press F2        :[id number]}    Medication Adjustments/Labs and Tests Ordered: Current medicines are reviewed at length with the patient today.  Concerns regarding medicines are outlined above.  No orders of the defined types were placed in this encounter.  No orders of the defined types  were placed in this encounter.   There are no Patient Instructions on file for this visit.   Signed, Tami Lin Tura Roller, PA  09/23/2022 7:22 PM    [facility]

## 2022-09-26 ENCOUNTER — Encounter: Payer: Self-pay | Admitting: Physician Assistant

## 2022-09-26 ENCOUNTER — Ambulatory Visit: Payer: Commercial Managed Care - HMO | Attending: Physician Assistant | Admitting: Physician Assistant

## 2022-09-26 VITALS — BP 118/76 | HR 79 | Ht 74.0 in | Wt 231.0 lb

## 2022-09-26 DIAGNOSIS — E782 Mixed hyperlipidemia: Secondary | ICD-10-CM

## 2022-09-26 DIAGNOSIS — I25118 Atherosclerotic heart disease of native coronary artery with other forms of angina pectoris: Secondary | ICD-10-CM

## 2022-09-26 DIAGNOSIS — E1169 Type 2 diabetes mellitus with other specified complication: Secondary | ICD-10-CM

## 2022-09-26 DIAGNOSIS — I10 Essential (primary) hypertension: Secondary | ICD-10-CM

## 2022-09-26 NOTE — Patient Instructions (Signed)
Medication Instructions:  STOP Losartan 100 mg STOP Maxzide 37.5 mg *If you need a refill on your cardiac medications before your next appointment, please call your pharmacy*   Lab Work: Lipid Panel and Lipoprotein A. If you have labs (blood work) drawn today and your tests are completely normal, you will receive your results only by: [place] (if you have MyChart) OR A paper copy in the mail If you have any lab test that is abnormal or we need to change your treatment, we will call you to review the results.   Follow-Up: At [facility] you and your health needs are our priority.  As part of our continuing mission to provide you with exceptional heart care, we have created designated Provider Care Teams.  These Care Teams include your primary Cardiologist (physician) and Advanced Practice Providers (APPs -  Physician Assistants and Nurse Practitioners) who all work together to provide you with the care you need, when you need it.  We recommend signing up for the patient portal called "MyChart".  Sign up information is provided on this After Visit Summary.  MyChart is used to connect with patients for Virtual Visits (Telemedicine).  Patients are able to view lab/test results, encounter notes, upcoming appointments, etc.  Non-urgent messages can be sent to your provider as well.   To learn more about what you can do with MyChart, go to [email or web address].    Your next appointment:   DEC 03/2022 at 08:20 AM   Provider:   Evalina Field, MD

## 2022-09-27 ENCOUNTER — Other Ambulatory Visit (HOSPITAL_COMMUNITY): Payer: Self-pay | Admitting: *Deleted

## 2022-09-27 DIAGNOSIS — Z951 Presence of aortocoronary bypass graft: Secondary | ICD-10-CM

## 2022-09-28 ENCOUNTER — Telehealth (HOSPITAL_COMMUNITY): Payer: Self-pay | Admitting: *Deleted

## 2022-09-28 NOTE — Telephone Encounter (Signed)
-----   Message from Geralynn Rile, MD sent at 09/27/2022  7:56 PM EDT ----- Regarding: RE: Cardiac rehab referral Agree with cardiac rehab. Can send to office for signature. -W ----- Message ----- From: Rowe Pavy, RN Sent: 09/27/2022   1:06 PM EDT To: Geralynn Rile, MD Subject: Cardiac rehab referral                         Dr. Marisue Ivan,  The above pt is very interested in participating in cardiac rehab.  He is s/p CABG x 3 on 7/28 at [facility] by Dr. Norm Parcel.  Completed his post op visit with Dr. Norm Parcel and also seen on yesterday locally by Fabian Sharp doe cardiology needs.  Pt continue to recover very nicely and is eager to participate in Cardiac rehab.  I have place a referral pending your co signature in agreement  Thanks so much for your support of Cardiac Rehab  Maurice Small RN, BSN Cardiac and Pulmonary Rehab Nurse Navigator

## 2022-11-01 ENCOUNTER — Encounter (HOSPITAL_COMMUNITY): Payer: Self-pay

## 2022-11-01 ENCOUNTER — Telehealth (HOSPITAL_COMMUNITY): Payer: Self-pay

## 2022-11-01 NOTE — Telephone Encounter (Signed)
Attempted to call patient in regards to Cardiac Rehab - LM on VM Mailed letter 

## 2022-11-23 ENCOUNTER — Telehealth (HOSPITAL_COMMUNITY): Payer: Self-pay

## 2022-11-23 NOTE — Telephone Encounter (Signed)
Pt unable to do the cardiac rehab program at this time and will call back at a later time. Closed referral.

## 2022-11-24 NOTE — Progress Notes (Signed)
Cardiology Office Note:   Date:  11/28/2022  NAME:  Joseph Fowler    MRN: [id number] DOB:  [birth year]   PCP:  Mosie Lukes, MD  Cardiologist:  Evalina Field, MD  Electrophysiologist:  None   Referring MD: Mosie Lukes, MD   Chief Complaint  Patient presents with   Follow-up        History of Present Illness:   Joseph Fowler is a 66 y.o. male with a hx of diabetes, hypertension, CAD status post CABG who presents for follow-up.  Underwent bypass surgery in July of this year at [facility].  Echocardiogram showed normal LV function. He reports he is doing well since surgery.  Needs repeat lipids as well as A1c.  He is walking up to 1.5 miles per day.  No chest pain or trouble breathing.  Some soreness in the chest but nothing similar to his prior angina.  Blood pressures have been running around 140.  We discussed going back on amlodipine.  Overall seems to be doing well.  Needs further titration of medical therapy.  Problem List DM -A1c 6.8 HTN HLD -T chol 134, HDL 35, LDL 74, TG 377 4. CAD s/p CABG  -Calcium score 7238 -CABG x 3 @ [facility] 07/22/2022 [facility])  Past Medical History: Past Medical History:  Diagnosis Date   Allergy    Arthritis 09/06/2013   Blood in stool 12/03/2012   BPH (benign prostatic hyperplasia) 09/12/2012   Chicken pox as a child   Dermatitis 09/12/2012   Diabetes mellitus without complication ([place])    ED (erectile dysfunction) 09/12/2012   Hyperlipidemia    Hypertension 66 yrs old   Nocturia 09/12/2012   Obesity 09/12/2012   Overweight(278.02) 09/12/2012   PONV (postoperative nausea and vomiting)    Has nausea   Preventative health care 09/12/2012   Prostate cancer ([place])    Snoring 09/12/2012    Past Surgical History: Past Surgical History:  Procedure Laterality Date   APPENDECTOMY  66 yrs old   EYE SURGERY  12/27/2003   ,right, injury, decreased visual acuity, photophobia   HERNIA REPAIR     LEFT HEART CATH AND CORONARY ANGIOGRAPHY N/A  07/07/2022   Procedure: LEFT HEART CATH AND CORONARY ANGIOGRAPHY;  Surgeon: Burnell Blanks, MD;  Location: [place] [facility];  Service: Cardiovascular;  Laterality: N/A;   LYMPHADENECTOMY Bilateral 10/15/2020   Procedure: LYMPHADENECTOMY, PELVIC;  Surgeon: Raynelle Bring, MD;  Location: [facility];  Service: Urology;  Laterality: Bilateral;   PROSTATE BIOPSY     ROBOT ASSISTED LAPAROSCOPIC RADICAL PROSTATECTOMY N/A 10/15/2020   Procedure: XI ROBOTIC ASSISTED LAPAROSCOPIC RADICAL PROSTATECTOMY LEVEL 2;  Surgeon: Raynelle Bring, MD;  Location: [facility];  Service: Urology;  Laterality: N/A;   torn meninscus  12/26/2009   knee- right   VASECTOMY  66 yrs old    Current Medications: Current Meds  Medication Sig   Ascorbic Acid (VITAMIN C) 1000 MG tablet Take 1,000 mg by mouth daily.   aspirin EC 81 MG tablet Take 1 tablet (81 mg total) by mouth daily. Swallow whole.   atorvastatin (LIPITOR) 40 MG tablet Take 1 tablet (40 mg total) by mouth at bedtime.   Bioflavonoid Products (BIOFLEX) TABS Take 2 tablets by mouth daily.   blood glucose meter kit and supplies Dispense based on patient and insurance preference. Use up to four times daily as directed. (FOR ICD-10 E10.9, E11.9).   Blood Glucose Monitoring Suppl (ONETOUCH VERIO) w/Device KIT Use up to four times  daily as directed.   Calcium Polycarbophil (FIBER-CAPS PO) Take 2 capsules by mouth daily.   Cholecalciferol (VITAMIN D) 50 MCG (2000 UT) tablet Take 2,000 Units by mouth daily.   doxycycline (VIBRA-TABS) 100 MG tablet Take 1 tablet (100 mg total) by mouth 2 (two) times daily.   fluticasone (FLONASE) 50 MCG/ACT nasal spray Place 2 sprays into both nostrils daily. (Patient taking differently: Place 2 sprays into both nostrils at bedtime as needed for allergies.)   glucose blood (ONETOUCH VERIO) test strip Use as instructed   loratadine (CLARITIN) 10 MG tablet Take 1 tablet (10 mg total) by mouth daily.   magnesium oxide (MAG-OX) 400 (240  Mg) MG tablet Take 400 mg by mouth at bedtime.   metFORMIN (GLUCOPHAGE) 500 MG tablet TAKE 1 TABLET BY MOUTH TWICE DAILY WITH A MEAL   metoprolol tartrate (LOPRESSOR) 25 MG tablet Take 1.5 tablets (37.5 mg total) by mouth 2 (two) times daily.   Multiple Vitamins-Minerals (MULTIVITAMIN WITH MINERALS) tablet Take 1 tablet by mouth daily.   nitroGLYCERIN (NITROSTAT) 0.4 MG SL tablet Place 1 tablet (0.4 mg total) under the tongue every 5 (five) minutes as needed for chest pain.   OneTouch Delica Lancets 48A MISC Use as instructed   sildenafil (VIAGRA) 100 MG tablet TAKE 1 TABLET BY MOUTH ONCE DAILY AS NEEDED FOR ERECTILE DYSFUNCTION   Vibegron (GEMTESA) 75 MG TABS Take 75 mg by mouth daily.   [DISCONTINUED] amLODipine (NORVASC) 5 MG tablet TAKE 1 TABLET BY MOUTH IN THE MORNING AND AT BEDTIME     Allergies:    Patient has no known allergies.   Social History: Social History   Socioeconomic History   Marital status: Married    Spouse name: Not on file   Number of children: 6   Years of education: Not on file   Highest education level: Not on file  Occupational History   Occupation: Retired - Warden/ranger  Tobacco Use   Smoking status: Former    Packs/day: 2.00    Years: 4.00    Total pack years: 8.00    Types: Cigarettes    Quit date: 12/26/1981    Years since quitting: 40.9   Smokeless tobacco: Never  Vaping Use   Vaping Use: Never used  Substance and Sexual Activity   Alcohol use: No    Comment: Very rare   Drug use: No   Sexual activity: Yes  Other Topics Concern   Not on file  Social History Narrative   Not on file   Social Determinants of Health   Financial Resource Strain: Not on file  Food Insecurity: Not on file  Transportation Needs: Not on file  Physical Activity: Not on file  Stress: Not on file  Social Connections: Not on file     Family History: The patient's family history includes Arthritis in his paternal uncle; Dementia in his mother; Diabetes in his  father; Heart disease in his brother; Other in his brother; Stroke in his maternal grandmother. There is no history of Colon cancer.  ROS:   All other ROS reviewed and negative. Pertinent positives noted in the HPI.     EKGs/Labs/Other Studies Reviewed:   The following studies were personally reviewed by me today:  TTE 06/17/2022  1. Left ventricular ejection fraction, by estimation, is 60 to 65%. The  left ventricle has normal function. The left ventricle has no regional  wall motion abnormalities. There is mild asymmetric left ventricular  hypertrophy of the basal-septal segment.  Left ventricular diastolic parameters are consistent with Grade I  diastolic dysfunction (impaired relaxation).   2. Right ventricular systolic function is normal. The right ventricular  size is normal. There is normal pulmonary artery systolic pressure.   3. Left atrial size was moderately dilated.   4. Right atrial size was mildly dilated.   5. The mitral valve is degenerative. No evidence of mitral valve  regurgitation. No evidence of mitral stenosis.   6. The aortic valve is tricuspid. There is mild calcification of the  aortic valve. Aortic valve regurgitation is not visualized. Aortic valve  sclerosis/calcification is present, without any evidence of aortic  stenosis.   7. Aortic dilatation noted. There is mild dilatation of the ascending  aorta, measuring 41 mm.   8. The inferior vena cava is normal in size with greater than 50%  respiratory variability, suggesting right atrial pressure of 3 mmHg.   Recent Labs: 05/26/2022: ALT 26; TSH 1.00 06/23/2022: BUN 24; Creatinine, Ser 0.96; Hemoglobin 14.0; Platelets 252; Potassium 3.6; Sodium 139   Recent Lipid Panel    Component Value Date/Time   CHOL 134 05/26/2022 1606   TRIG 377.0 (H) 05/26/2022 1606   HDL 35.10 (L) 05/26/2022 1606   CHOLHDL 4 05/26/2022 1606   VLDL 75.4 (H) 05/26/2022 1606   LDLCALC 45 06/30/2020 1031   LDLDIRECT 74.0 05/26/2022  1606    Physical Exam:   VS:  BP (!) 142/80   Pulse (!) 58   Ht _0  (1.88 m)   Wt 227 lb (103 kg)   SpO2 97%   BMI 29.15 kg/m    Wt Readings from Last 3 Encounters:  11/28/22 227 lb (103 kg)  09/26/22 231 lb (104.8 kg)  07/07/22 240 lb (108.9 kg)    General: Well nourished, well developed, in no acute distress Head: Atraumatic, normal size  Eyes: PEERLA, EOMI  Neck: Supple, no JVD Endocrine: No thryomegaly Cardiac: Normal S1, S2; RRR; no murmurs, rubs, or gallops Lungs: Clear to auscultation bilaterally, no wheezing, rhonchi or rales  Abd: Soft, nontender, no hepatomegaly  Ext: No edema, pulses 2+ Musculoskeletal: No deformities, BUE and BLE strength normal and equal Skin: Warm and dry, no rashes   Neuro: Alert and oriented to person, place, time, and situation, CNII-XII grossly intact, no focal deficits  Psych: Normal mood and affect   ASSESSMENT:   ADIEN KIMMEL is a 66 y.o. male who presents for the following: 1. Coronary artery disease involving coronary bypass graft of native heart without angina pectoris   2. Mixed hyperlipidemia   3. Primary hypertension     PLAN:   1. Coronary artery disease involving coronary bypass graft of native heart without angina pectoris 2. Mixed hyperlipidemia -Status post CABG x3 at [facility] on 07/22/2022.  Doing well.  No symptoms of angina.  Continue aspirin.  No restrictions.  He should continue with activity as he sees fit.  We will recheck lipids today.  On Lipitor 40 mg daily.  I would also like to recheck an A1c.  Diabetes and cholesterol are his biggest risk factors.  See discussion on blood pressure below.  Echocardiogram showed normal LV function.  He will see [place] yearly.  He may need to see an APP in a few months for lipid titration.  3. Primary hypertension -Blood pressure not at goal.  Add amlodipine 5 mg daily.  Continue metoprolol tartrate 37.5 mg twice daily.  He will continue to check a log.  Goal is less than 130/80.  Disposition: Return in about 1 year (around 11/29/2023).  Medication Adjustments/Labs and Tests Ordered: Current medicines are reviewed at length with the patient today.  Concerns regarding medicines are outlined above.  Orders Placed This Encounter  Procedures   Lipid panel   Hemoglobin A1c   Meds ordered this encounter  Medications   DISCONTD: amLODipine (NORVASC) 5 MG tablet    Sig: TAKE 1 TABLET BY MOUTH IN THE MORNING AND AT BEDTIME    Dispense:  180 tablet    Refill:  1   amLODipine (NORVASC) 5 MG tablet    Sig: TAKE 1 TABLET BY MOUTH DAILY    Dispense:  180 tablet    Refill:  1    Updated prescription    Patient Instructions  Medication Instructions:  START Amlodipine 5 mg daily   *If you need a refill on your cardiac medications before your next appointment, please call your pharmacy*  Lab Work: LIPID, A1C today   If you have labs (blood work) drawn today and your tests are completely normal, you will receive your results only by: MyChart Message (if you have MyChart) OR A paper copy in the mail If you have any lab test that is abnormal or we need to change your treatment, we will call you to review the results.   Follow-Up: At [facility], you and your health needs are our priority.  As part of our continuing mission to provide you with exceptional heart care, we have created designated Provider Care Teams.  These Care Teams include your primary Cardiologist (physician) and Advanced Practice Providers (APPs -  Physician Assistants and Nurse Practitioners) who all work together to provide you with the care you need, when you need it.  We recommend signing up for the patient portal called "MyChart".  Sign up information is provided on this After Visit Summary.  MyChart is used to connect with patients for Virtual Visits (Telemedicine).  Patients are able to view lab/test results, encounter notes, upcoming appointments, etc.  Non-urgent messages can be sent  to your provider as well.   To learn more about what you can do with MyChart, go to [email or web address].    Your next appointment:   12 month(s)  The format for your next appointment:   In Person  Provider:   Evalina Field, MD          Time Spent with Patient: I have spent a total of 35 minutes with patient reviewing hospital notes, telemetry, EKGs, labs and examining the patient as well as establishing an assessment and plan that was discussed with the patient.  > 50% of time was spent in direct patient care.  Signed, Addison Naegeli. Audie Box, MD, [place]  [address], [place], [place] [address] [phone]  11/28/2022 8:47 AM

## 2022-11-28 ENCOUNTER — Ambulatory Visit: Payer: Commercial Managed Care - HMO | Attending: Cardiovascular Disease | Admitting: Cardiovascular Disease

## 2022-11-28 ENCOUNTER — Encounter: Payer: Self-pay | Admitting: Cardiovascular Disease

## 2022-11-28 VITALS — BP 142/80 | HR 58 | Ht 74.0 in | Wt 227.0 lb

## 2022-11-28 DIAGNOSIS — E782 Mixed hyperlipidemia: Secondary | ICD-10-CM | POA: Diagnosis not present

## 2022-11-28 DIAGNOSIS — I25810 Atherosclerosis of coronary artery bypass graft(s) without angina pectoris: Secondary | ICD-10-CM | POA: Diagnosis not present

## 2022-11-28 DIAGNOSIS — I10 Essential (primary) hypertension: Secondary | ICD-10-CM | POA: Diagnosis not present

## 2022-11-28 MED ORDER — AMLODIPINE BESYLATE 5 MG PO TABS: ORAL_TABLET | ORAL | 1 refills | Status: DC

## 2022-11-28 NOTE — Patient Instructions (Signed)
Medication Instructions:  START Amlodipine 5 mg daily   *If you need a refill on your cardiac medications before your next appointment, please call your pharmacy*  Lab Work: LIPID, A1C today   If you have labs (blood work) drawn today and your tests are completely normal, you will receive your results only by: [place] (if you have MyChart) OR A paper copy in the mail If you have any lab test that is abnormal or we need to change your treatment, we will call you to review the results.   Follow-Up: At [facility], you and your health needs are our priority.  As part of our continuing mission to provide you with exceptional heart care, we have created designated Provider Care Teams.  These Care Teams include your primary Cardiologist (physician) and Advanced Practice Providers (APPs -  Physician Assistants and Nurse Practitioners) who all work together to provide you with the care you need, when you need it.  We recommend signing up for the patient portal called "MyChart".  Sign up information is provided on this After Visit Summary.  MyChart is used to connect with patients for Virtual Visits (Telemedicine).  Patients are able to view lab/test results, encounter notes, upcoming appointments, etc.  Non-urgent messages can be sent to your provider as well.   To learn more about what you can do with MyChart, go to [email or web address].    Your next appointment:   12 month(s)  The format for your next appointment:   In Person  Provider:   Evalina Field, MD

## 2022-11-29 LAB — LIPID PANEL
Chol/HDL Ratio: 2.9 ratio (ref 0.0–5.0)
Cholesterol, Total: 127 mg/dL (ref 100–199)
HDL: 44 mg/dL (ref >39)
LDL Chol Calc (NIH): 65 mg/dL (ref 0–99)
Triglycerides: 93 mg/dL (ref 0–149)
VLDL Cholesterol Cal: 18 mg/dL (ref 5–40)

## 2022-11-29 LAB — HEMOGLOBIN A1C
Est. average glucose Bld gHb Est-mCnc: 143 mg/dL
Hgb A1c MFr Bld: 6.6 % — ABNORMAL HIGH (ref 4.8–5.6)

## 2022-12-01 ENCOUNTER — Ambulatory Visit (HOSPITAL_COMMUNITY): Payer: Commercial Managed Care - HMO

## 2022-12-05 ENCOUNTER — Ambulatory Visit (HOSPITAL_COMMUNITY): Payer: Commercial Managed Care - HMO

## 2022-12-06 ENCOUNTER — Other Ambulatory Visit: Payer: Self-pay | Admitting: Cardiovascular Disease

## 2022-12-07 ENCOUNTER — Ambulatory Visit (HOSPITAL_COMMUNITY): Payer: Commercial Managed Care - HMO

## 2022-12-09 ENCOUNTER — Ambulatory Visit (HOSPITAL_COMMUNITY): Payer: Commercial Managed Care - HMO

## 2022-12-11 ENCOUNTER — Other Ambulatory Visit: Payer: Self-pay | Admitting: Family Medicine

## 2022-12-12 ENCOUNTER — Ambulatory Visit (HOSPITAL_COMMUNITY): Payer: Commercial Managed Care - HMO

## 2022-12-12 NOTE — Telephone Encounter (Signed)
Requesting: zolpidem '10mg'$   Contract: None UDS: None Last Visit: 05/26/22 Next Visit: None Last Refill: 08/22/22 #30 and 1RF  Please Advise

## 2022-12-14 ENCOUNTER — Ambulatory Visit (HOSPITAL_COMMUNITY): Payer: Commercial Managed Care - HMO

## 2022-12-16 ENCOUNTER — Ambulatory Visit (HOSPITAL_COMMUNITY): Payer: Commercial Managed Care - HMO

## 2022-12-21 ENCOUNTER — Ambulatory Visit (HOSPITAL_COMMUNITY): Payer: Commercial Managed Care - HMO

## 2022-12-23 ENCOUNTER — Ambulatory Visit (HOSPITAL_COMMUNITY): Payer: Commercial Managed Care - HMO

## 2022-12-26 ENCOUNTER — Encounter: Payer: Self-pay | Admitting: Cardiovascular Disease

## 2022-12-28 ENCOUNTER — Ambulatory Visit (HOSPITAL_COMMUNITY): Payer: Commercial Managed Care - HMO

## 2022-12-28 ENCOUNTER — Other Ambulatory Visit: Payer: Self-pay | Admitting: Family Medicine

## 2022-12-29 MED ORDER — METFORMIN HCL 500 MG PO TABS: 500.0000 mg | ORAL_TABLET | Freq: Two times a day (BID) | ORAL | 0 refills | Status: DC

## 2022-12-30 ENCOUNTER — Ambulatory Visit (HOSPITAL_COMMUNITY): Payer: Commercial Managed Care - HMO

## 2023-01-02 ENCOUNTER — Ambulatory Visit (HOSPITAL_COMMUNITY): Payer: Commercial Managed Care - HMO

## 2023-01-04 ENCOUNTER — Ambulatory Visit (HOSPITAL_COMMUNITY): Payer: Commercial Managed Care - HMO

## 2023-01-06 ENCOUNTER — Ambulatory Visit (HOSPITAL_COMMUNITY): Payer: Commercial Managed Care - HMO

## 2023-01-08 ENCOUNTER — Other Ambulatory Visit: Payer: Self-pay | Admitting: Family Medicine

## 2023-01-08 DIAGNOSIS — I10 Essential (primary) hypertension: Secondary | ICD-10-CM

## 2023-01-09 ENCOUNTER — Ambulatory Visit (HOSPITAL_COMMUNITY): Payer: Commercial Managed Care - HMO

## 2023-01-09 ENCOUNTER — Encounter: Payer: Self-pay | Admitting: *Deleted

## 2023-01-11 ENCOUNTER — Ambulatory Visit (HOSPITAL_COMMUNITY): Payer: Commercial Managed Care - HMO

## 2023-01-12 ENCOUNTER — Telehealth: Payer: Self-pay | Admitting: Cardiovascular Disease

## 2023-01-12 NOTE — Telephone Encounter (Signed)
Spoke to patient he stated he wanted to ask Dr.O'Neal if he needs antibiotics before dental work.I will send message to Dr.O'Neal for advice.

## 2023-01-12 NOTE — Telephone Encounter (Signed)
Patient stated he has a dental appointment this morning and his dentist wants to give him an antibiotic prior to his dental work.  Patient would like advice if this would be OK.

## 2023-01-12 NOTE — Telephone Encounter (Signed)
Called patient left message on personal voice mail Dr.O'Neal advised no antibiotics needed for dental work.

## 2023-01-13 ENCOUNTER — Ambulatory Visit (HOSPITAL_COMMUNITY): Payer: Commercial Managed Care - HMO

## 2023-01-16 ENCOUNTER — Ambulatory Visit (HOSPITAL_COMMUNITY): Payer: Commercial Managed Care - HMO

## 2023-01-18 ENCOUNTER — Ambulatory Visit (HOSPITAL_COMMUNITY): Payer: Commercial Managed Care - HMO

## 2023-01-20 ENCOUNTER — Ambulatory Visit (HOSPITAL_COMMUNITY): Payer: Commercial Managed Care - HMO

## 2023-01-23 ENCOUNTER — Ambulatory Visit (HOSPITAL_COMMUNITY): Payer: Commercial Managed Care - HMO

## 2023-01-25 ENCOUNTER — Other Ambulatory Visit: Payer: Self-pay | Admitting: Family Medicine

## 2023-01-25 ENCOUNTER — Ambulatory Visit (HOSPITAL_COMMUNITY): Payer: Commercial Managed Care - HMO

## 2023-01-27 ENCOUNTER — Ambulatory Visit (HOSPITAL_COMMUNITY): Payer: Commercial Managed Care - HMO

## 2023-01-30 ENCOUNTER — Ambulatory Visit (HOSPITAL_COMMUNITY): Payer: Commercial Managed Care - HMO

## 2023-01-31 ENCOUNTER — Encounter: Payer: Self-pay | Admitting: Family Medicine

## 2023-01-31 NOTE — Telephone Encounter (Signed)
Requesting: Ambien '10mg'$   Contract: None UDS: None  Last Visit: 05/26/22 Next Visit: None Last Refill: 12/12/22 #30 and 0RF   Please Advise

## 2023-02-01 ENCOUNTER — Ambulatory Visit (HOSPITAL_COMMUNITY): Payer: Commercial Managed Care - HMO

## 2023-02-01 NOTE — Telephone Encounter (Signed)
Requesting: zolpidem Contract: No UDS: none Last Visit: 05/26/22 Next Visit: 06/12/22  (next available for f/u was May, so just made appt for cpe) Last Refill: 12/12/22  Please Advise

## 2023-02-02 ENCOUNTER — Other Ambulatory Visit: Payer: Self-pay | Admitting: Family Medicine

## 2023-02-02 MED ORDER — ZOLPIDEM TARTRATE 10 MG PO TABS: ORAL_TABLET | ORAL | 0 refills | Status: DC

## 2023-02-02 NOTE — Telephone Encounter (Signed)
Called pt and has appt  Coming up.

## 2023-02-03 ENCOUNTER — Ambulatory Visit (HOSPITAL_COMMUNITY): Payer: Commercial Managed Care - HMO

## 2023-02-06 ENCOUNTER — Ambulatory Visit (HOSPITAL_COMMUNITY): Payer: Commercial Managed Care - HMO

## 2023-02-08 ENCOUNTER — Ambulatory Visit (HOSPITAL_COMMUNITY): Payer: Commercial Managed Care - HMO

## 2023-02-10 ENCOUNTER — Ambulatory Visit (HOSPITAL_COMMUNITY): Payer: Commercial Managed Care - HMO

## 2023-02-20 ENCOUNTER — Other Ambulatory Visit: Payer: Self-pay | Admitting: Family Medicine

## 2023-02-23 ENCOUNTER — Telehealth: Payer: Self-pay | Admitting: Family Medicine

## 2023-02-23 NOTE — Telephone Encounter (Signed)
Pt states his cardiologist recommended that he takes metformin 2 tablets twice daily. So he needs rx changed and sent as soon as possible as he is completely out.

## 2023-02-24 NOTE — Telephone Encounter (Signed)
Message was sent to provider.

## 2023-02-24 NOTE — Telephone Encounter (Signed)
Pt called to follow up on Rx request for Metformin. Advised pt that the original message had been routed back to Dr. Charlett Blake as high priority and we are waiting on her to rewrite and send in the script. Advised also that Dr. Charlett Blake is not in the office today and it might take a little time for her to look at it. Pt asked if a note could be sent back to look into this matter as he is currently out of medication. Pt stated that his BS is still currently in normal range but is unsure how long that will stay that way.

## 2023-02-27 ENCOUNTER — Other Ambulatory Visit: Payer: Self-pay

## 2023-02-27 MED ORDER — METFORMIN HCL 500 MG PO TABS: 500.0000 mg | ORAL_TABLET | Freq: Two times a day (BID) | ORAL | 3 refills | Status: DC

## 2023-02-27 NOTE — Telephone Encounter (Signed)
Medication sent.

## 2023-03-06 ENCOUNTER — Other Ambulatory Visit: Payer: Self-pay | Admitting: Cardiovascular Disease

## 2023-03-26 ENCOUNTER — Other Ambulatory Visit: Payer: Self-pay | Admitting: Family Medicine

## 2023-04-05 ENCOUNTER — Other Ambulatory Visit: Payer: Self-pay | Admitting: Family Medicine

## 2023-04-05 DIAGNOSIS — I10 Essential (primary) hypertension: Secondary | ICD-10-CM

## 2023-05-29 ENCOUNTER — Other Ambulatory Visit: Payer: Self-pay | Admitting: Family Medicine

## 2023-05-29 NOTE — Telephone Encounter (Signed)
Requesting: Ambien 10mg  Contract: None UDS: None Last Visit: 05/26/2022 Next Visit: 06/13/2023 Last Refill: 03/27/2023, # 30 x 0RF  Please Advise

## 2023-06-13 ENCOUNTER — Ambulatory Visit (INDEPENDENT_AMBULATORY_CARE_PROVIDER_SITE_OTHER): Payer: Medicare HMO | Admitting: Family Medicine

## 2023-06-13 ENCOUNTER — Encounter: Payer: Self-pay | Admitting: Family Medicine

## 2023-06-13 VITALS — BP 136/82 | HR 63 | Temp 98.0°F | Resp 16 | Ht 74.0 in | Wt 225.8 lb

## 2023-06-13 DIAGNOSIS — R0683 Snoring: Secondary | ICD-10-CM

## 2023-06-13 DIAGNOSIS — Z1211 Encounter for screening for malignant neoplasm of colon: Secondary | ICD-10-CM | POA: Diagnosis not present

## 2023-06-13 DIAGNOSIS — Z79899 Other long term (current) drug therapy: Secondary | ICD-10-CM

## 2023-06-13 DIAGNOSIS — I25810 Atherosclerosis of coronary artery bypass graft(s) without angina pectoris: Secondary | ICD-10-CM | POA: Diagnosis not present

## 2023-06-13 DIAGNOSIS — Z0000 Encounter for general adult medical examination without abnormal findings: Secondary | ICD-10-CM

## 2023-06-13 DIAGNOSIS — N529 Male erectile dysfunction, unspecified: Secondary | ICD-10-CM

## 2023-06-13 DIAGNOSIS — L570 Actinic keratosis: Secondary | ICD-10-CM

## 2023-06-13 DIAGNOSIS — G4700 Insomnia, unspecified: Secondary | ICD-10-CM

## 2023-06-13 DIAGNOSIS — G8929 Other chronic pain: Secondary | ICD-10-CM

## 2023-06-13 DIAGNOSIS — E782 Mixed hyperlipidemia: Secondary | ICD-10-CM

## 2023-06-13 DIAGNOSIS — E1169 Type 2 diabetes mellitus with other specified complication: Secondary | ICD-10-CM

## 2023-06-13 DIAGNOSIS — M25561 Pain in right knee: Secondary | ICD-10-CM

## 2023-06-13 DIAGNOSIS — I10 Essential (primary) hypertension: Secondary | ICD-10-CM | POA: Diagnosis not present

## 2023-06-13 DIAGNOSIS — Z7984 Long term (current) use of oral hypoglycemic drugs: Secondary | ICD-10-CM

## 2023-06-13 DIAGNOSIS — C61 Malignant neoplasm of prostate: Secondary | ICD-10-CM

## 2023-06-13 LAB — COMPREHENSIVE METABOLIC PANEL
ALT: 17 U/L (ref 0–53)
AST: 16 U/L (ref 0–37)
Albumin: 4.5 g/dL (ref 3.5–5.2)
Alkaline Phosphatase: 55 U/L (ref 39–117)
BUN: 11 mg/dL (ref 6–23)
CO2: 29 mEq/L (ref 19–32)
Calcium: 9.4 mg/dL (ref 8.4–10.5)
Chloride: 100 mEq/L (ref 96–112)
Creatinine, Ser: 0.82 mg/dL (ref 0.40–1.50)
GFR: 92.38 mL/min (ref >60.00)
Glucose, Bld: 101 mg/dL — ABNORMAL HIGH (ref 70–99)
Potassium: 3.7 mEq/L (ref 3.5–5.1)
Sodium: 139 mEq/L (ref 135–145)
Total Bilirubin: 0.9 mg/dL (ref 0.2–1.2)
Total Protein: 7.4 g/dL (ref 6.0–8.3)

## 2023-06-13 LAB — LIPID PANEL
Cholesterol: 110 mg/dL (ref 0–200)
HDL: 37.9 mg/dL — ABNORMAL LOW (ref >39.00)
LDL Cholesterol: 48 mg/dL (ref 0–99)
NonHDL: 72.24
Total CHOL/HDL Ratio: 3
Triglycerides: 121 mg/dL (ref 0.0–149.0)
VLDL: 24.2 mg/dL (ref 0.0–40.0)

## 2023-06-13 LAB — BASIC METABOLIC PANEL
BUN: 11 mg/dL (ref 6–23)
CO2: 29 mEq/L (ref 19–32)
Calcium: 9.4 mg/dL (ref 8.4–10.5)
Chloride: 100 mEq/L (ref 96–112)
Creatinine, Ser: 0.82 mg/dL (ref 0.40–1.50)
GFR: 92.38 mL/min (ref >60.00)
Glucose, Bld: 101 mg/dL — ABNORMAL HIGH (ref 70–99)
Potassium: 3.7 mEq/L (ref 3.5–5.1)
Sodium: 139 mEq/L (ref 135–145)

## 2023-06-13 LAB — CBC WITH DIFFERENTIAL/PLATELET
Basophils Absolute: 0.1 10*3/uL (ref 0.0–0.1)
Basophils Relative: 0.9 % (ref 0.0–3.0)
Eosinophils Absolute: 0.2 10*3/uL (ref 0.0–0.7)
Eosinophils Relative: 2.9 % (ref 0.0–5.0)
HCT: 42.8 % (ref 39.0–52.0)
Hemoglobin: 14.2 g/dL (ref 13.0–17.0)
Lymphocytes Relative: 33.3 % (ref 12.0–46.0)
Lymphs Abs: 2.6 10*3/uL (ref 0.7–4.0)
MCHC: 33.2 g/dL (ref 30.0–36.0)
MCV: 87.8 fl (ref 78.0–100.0)
Monocytes Absolute: 0.6 10*3/uL (ref 0.1–1.0)
Monocytes Relative: 7.6 % (ref 3.0–12.0)
Neutro Abs: 4.3 10*3/uL (ref 1.4–7.7)
Neutrophils Relative %: 55.3 % (ref 43.0–77.0)
Platelets: 219 10*3/uL (ref 150.0–400.0)
RBC: 4.87 Mil/uL (ref 4.22–5.81)
RDW: 14.4 % (ref 11.5–15.5)
WBC: 7.8 10*3/uL (ref 4.0–10.5)

## 2023-06-13 LAB — TSH: TSH: 1.16 u[IU]/mL (ref 0.35–5.50)

## 2023-06-13 MED ORDER — METFORMIN HCL 500 MG PO TABS: 1000.0000 mg | ORAL_TABLET | Freq: Two times a day (BID) | ORAL | 1 refills | Status: DC

## 2023-06-13 MED ORDER — METOPROLOL TARTRATE 25 MG PO TABS: 37.5000 mg | ORAL_TABLET | Freq: Two times a day (BID) | ORAL | 1 refills | Status: DC

## 2023-06-13 NOTE — Assessment & Plan Note (Signed)
Had 3 vessels grafted 9 months ago

## 2023-06-13 NOTE — Assessment & Plan Note (Signed)
Had a meniscal tear repaired years ago now having daily pain, saw ortho this past year and is now at end stage arthritis and he is trying to avoid surgery. Sees [facility]

## 2023-06-13 NOTE — Assessment & Plan Note (Signed)
Never proceeded with sleep study. He is still snoring but he thinks it is better.

## 2023-06-13 NOTE — Patient Instructions (Addendum)
Prevnar 20 at pharmacy or at office   RSV, Respiratory Syncitial Virus vaccine, Arexvy at pharmacy  Shingrix is the new shingles shot, 2 shots over 2-6 months, confirm coverage with insurance and document, then can return here for shots with nurse appt or at pharmacy   Covid and flu boosters in fall   Preventive Care 65 Years and Older, Male Preventive care refers to lifestyle choices and visits with your health care provider that can promote health and wellness. Preventive care visits are also called wellness exams. What can I expect for my preventive care visit? Counseling During your preventive care visit, your health care provider may ask about your: Medical history, including: Past medical problems. Family medical history. History of falls. Current health, including: Emotional well-being. Home life and relationship well-being. Sexual activity. Memory and ability to understand (cognition). Lifestyle, including: Alcohol, nicotine or tobacco, and drug use. Access to firearms. Diet, exercise, and sleep habits. Work and work Astronomer. Sunscreen use. Safety issues such as seatbelt and bike helmet use. Physical exam Your health care provider will check your: Height and weight. These may be used to calculate your BMI (body mass index). BMI is a measurement that tells if you are at a healthy weight. Waist circumference. This measures the distance around your waistline. This measurement also tells if you are at a healthy weight and may help predict your risk of certain diseases, such as type 2 diabetes and high blood pressure. Heart rate and blood pressure. Body temperature. Skin for abnormal spots. What immunizations do I need?  Vaccines are usually given at various ages, according to a schedule. Your health care provider will recommend vaccines for you based on your age, medical history, and lifestyle or other factors, such as travel or where you work. What tests do I  need? Screening Your health care provider may recommend screening tests for certain conditions. This may include: Lipid and cholesterol levels. Diabetes screening. This is done by checking your blood sugar (glucose) after you have not eaten for a while (fasting). Hepatitis C test. Hepatitis B test. HIV (human immunodeficiency virus) test. STI (sexually transmitted infection) testing, if you are at risk. Lung cancer screening. Colorectal cancer screening. Prostate cancer screening. Abdominal aortic aneurysm (AAA) screening. You may need this if you are a current or former smoker. Talk with your health care provider about your test results, treatment options, and if necessary, the need for more tests. Follow these instructions at home: Eating and drinking  Eat a diet that includes fresh fruits and vegetables, whole grains, lean protein, and low-fat dairy products. Limit your intake of foods with high amounts of sugar, saturated fats, and salt. Take vitamin and mineral supplements as recommended by your health care provider. Do not drink alcohol if your health care provider tells you not to drink. If you drink alcohol: Limit how much you have to 0-2 drinks a day. Know how much alcohol is in your drink. In the [place] one drink equals one 12 oz bottle of beer (355 mL), one 5 oz glass of wine (148 mL), or one 1 oz glass of hard liquor (44 mL). Lifestyle Brush your teeth every morning and night with fluoride toothpaste. Floss one time each day. Exercise for at least 30 minutes 5 or more days each week. Do not use any products that contain nicotine or tobacco. These products include cigarettes, chewing tobacco, and vaping devices, such as e-cigarettes. If you need help quitting, ask your health care provider. Do not use  drugs. If you are sexually active, practice safe sex. Use a condom or other form of protection to prevent STIs. Take aspirin only as told by your health care provider. Make sure  that you understand how much to take and what form to take. Work with your health care provider to find out whether it is safe and beneficial for you to take aspirin daily. Ask your health care provider if you need to take a cholesterol-lowering medicine (statin). Find healthy ways to manage stress, such as: Meditation, yoga, or listening to music. Journaling. Talking to a trusted person. Spending time with friends and family. Safety Always wear your seat belt while driving or riding in a vehicle. Do not drive: If you have been drinking alcohol. Do not ride with someone who has been drinking. When you are tired or distracted. While texting. If you have been using any mind-altering substances or drugs. Wear a helmet and other protective equipment during sports activities. If you have firearms in your house, make sure you follow all gun safety procedures. Minimize exposure to UV radiation to reduce your risk of skin cancer. What's next? Visit your health care provider once a year for an annual wellness visit. Ask your health care provider how often you should have your eyes and teeth checked. Stay up to date on all vaccines. This information is not intended to replace advice given to you by your health care provider. Make sure you discuss any questions you have with your health care provider. Document Revised: 06/09/2021 Document Reviewed: 06/09/2021 Elsevier Patient Education  2024 Elsevier Inc.    2-3 weeks between shots  Preventive Care 65 Years and Older, Male Preventive care refers to lifestyle choices and visits with your health care provider that can promote health and wellness. Preventive care visits are also called wellness exams. What can I expect for my preventive care visit? Counseling During your preventive care visit, your health care provider may ask about your: Medical history, including: Past medical problems. Family medical history. History of falls. Current  health, including: Emotional well-being. Home life and relationship well-being. Sexual activity. Memory and ability to understand (cognition). Lifestyle, including: Alcohol, nicotine or tobacco, and drug use. Access to firearms. Diet, exercise, and sleep habits. Work and work Astronomer. Sunscreen use. Safety issues such as seatbelt and bike helmet use. Physical exam Your health care provider will check your: Height and weight. These may be used to calculate your BMI (body mass index). BMI is a measurement that tells if you are at a healthy weight. Waist circumference. This measures the distance around your waistline. This measurement also tells if you are at a healthy weight and may help predict your risk of certain diseases, such as type 2 diabetes and high blood pressure. Heart rate and blood pressure. Body temperature. Skin for abnormal spots. What immunizations do I need?  Vaccines are usually given at various ages, according to a schedule. Your health care provider will recommend vaccines for you based on your age, medical history, and lifestyle or other factors, such as travel or where you work. What tests do I need? Screening Your health care provider may recommend screening tests for certain conditions. This may include: Lipid and cholesterol levels. Diabetes screening. This is done by checking your blood sugar (glucose) after you have not eaten for a while (fasting). Hepatitis C test. Hepatitis B test. HIV (human immunodeficiency virus) test. STI (sexually transmitted infection) testing, if you are at risk. Lung cancer screening. Colorectal cancer screening. Prostate  cancer screening. Abdominal aortic aneurysm (AAA) screening. You may need this if you are a current or former smoker. Talk with your health care provider about your test results, treatment options, and if necessary, the need for more tests. Follow these instructions at home: Eating and drinking  Eat a  diet that includes fresh fruits and vegetables, whole grains, lean protein, and low-fat dairy products. Limit your intake of foods with high amounts of sugar, saturated fats, and salt. Take vitamin and mineral supplements as recommended by your health care provider. Do not drink alcohol if your health care provider tells you not to drink. If you drink alcohol: Limit how much you have to 0-2 drinks a day. Know how much alcohol is in your drink. In the [place] one drink equals one 12 oz bottle of beer (355 mL), one 5 oz glass of wine (148 mL), or one 1 oz glass of hard liquor (44 mL). Lifestyle Brush your teeth every morning and night with fluoride toothpaste. Floss one time each day. Exercise for at least 30 minutes 5 or more days each week. Do not use any products that contain nicotine or tobacco. These products include cigarettes, chewing tobacco, and vaping devices, such as e-cigarettes. If you need help quitting, ask your health care provider. Do not use drugs. If you are sexually active, practice safe sex. Use a condom or other form of protection to prevent STIs. Take aspirin only as told by your health care provider. Make sure that you understand how much to take and what form to take. Work with your health care provider to find out whether it is safe and beneficial for you to take aspirin daily. Ask your health care provider if you need to take a cholesterol-lowering medicine (statin). Find healthy ways to manage stress, such as: Meditation, yoga, or listening to music. Journaling. Talking to a trusted person. Spending time with friends and family. Safety Always wear your seat belt while driving or riding in a vehicle. Do not drive: If you have been drinking alcohol. Do not ride with someone who has been drinking. When you are tired or distracted. While texting. If you have been using any mind-altering substances or drugs. Wear a helmet and other protective equipment during sports  activities. If you have firearms in your house, make sure you follow all gun safety procedures. Minimize exposure to UV radiation to reduce your risk of skin cancer. What's next? Visit your health care provider once a year for an annual wellness visit. Ask your health care provider how often you should have your eyes and teeth checked. Stay up to date on all vaccines. This information is not intended to replace advice given to you by your health care provider. Make sure you discuss any questions you have with your health care provider. Document Revised: 06/09/2021 Document Reviewed: 06/09/2021 Elsevier Patient Education  2024 [employer].

## 2023-06-13 NOTE — Progress Notes (Unsigned)
Subjective:    Patient ID: Joseph Fowler, male    DOB: [birth year], 67 y.o.   MRN: [id number]  No chief complaint on file.   HPI Discussed the use of AI scribe software for clinical note transcription with the patient, who gave verbal consent to proceed.  History of Present Illness            Past Medical History:  Diagnosis Date  . Allergy   . Arthritis 09/06/2013  . Blood in stool 12/03/2012  . BPH (benign prostatic hyperplasia) 09/12/2012  . Chicken pox as a child  . Dermatitis 09/12/2012  . Diabetes mellitus without complication (HCC)   . ED (erectile dysfunction) 09/12/2012  . Hyperlipidemia   . Hypertension 67 yrs old  . Nocturia 09/12/2012  . Obesity 09/12/2012  . Overweight(278.02) 09/12/2012  . PONV (postoperative nausea and vomiting)    Has nausea  . Preventative health care 09/12/2012  . Prostate cancer (HCC)   . Snoring 09/12/2012    Past Surgical History:  Procedure Laterality Date  . APPENDECTOMY  67 yrs old  . EYE SURGERY  12/27/2003   ,right, injury, decreased visual acuity, photophobia  . HERNIA REPAIR    . LEFT HEART CATH AND CORONARY ANGIOGRAPHY N/A 07/07/2022   Procedure: LEFT HEART CATH AND CORONARY ANGIOGRAPHY;  Surgeon: Kathleene Hazel, MD;  Location: [facility];  Service: Cardiovascular;  Laterality: N/A;  . LYMPHADENECTOMY Bilateral 10/15/2020   Procedure: LYMPHADENECTOMY, PELVIC;  Surgeon: Heloise Purpura, MD;  Location: [facility];  Service: Urology;  Laterality: Bilateral;  . PROSTATE BIOPSY    . ROBOT ASSISTED LAPAROSCOPIC RADICAL PROSTATECTOMY N/A 10/15/2020   Procedure: XI ROBOTIC ASSISTED LAPAROSCOPIC RADICAL PROSTATECTOMY LEVEL 2;  Surgeon: Heloise Purpura, MD;  Location: [facility];  Service: Urology;  Laterality: N/A;  . torn meninscus  12/26/2009   knee- right  . VASECTOMY  67 yrs old    Family History  Problem Relation Age of Onset  . Dementia Mother   . Diabetes Father        type 2  . Other Brother        whole  in heart  . Heart disease Brother        sudden cardiac death  . Arthritis Paternal Uncle   . Stroke Maternal Grandmother        X 2  . Colon cancer Neg Hx     Social History   Socioeconomic History  . Marital status: Married    Spouse name: Not on file  . Number of children: 6  . Years of education: Not on file  . Highest education level: Not on file  Occupational History  . Occupation: Retired - Actor  Tobacco Use  . Smoking status: Former    Packs/day: 2.00    Years: 4.00    Additional pack years: 0.00    Total pack years: 8.00    Types: Cigarettes    Quit date: 12/26/1981    Years since quitting: 41.4  . Smokeless tobacco: Never  Vaping Use  . Vaping Use: Never used  Substance and Sexual Activity  . Alcohol use: No    Comment: Very rare  . Drug use: No  . Sexual activity: Yes  Other Topics Concern  . Not on file  Social History Narrative  . Not on file   Social Determinants of Health   Financial Resource Strain: Not on file  Food Insecurity: Not on file  Transportation Needs: Not on file  Physical Activity: Not on file  Stress: Not on file  Social Connections: Not on file  Intimate Partner Violence: Not on file    Outpatient Medications Prior to Visit  Medication Sig Dispense Refill  . amLODipine (NORVASC) 5 MG tablet TAKE 1 TABLET BY MOUTH IN THE MORNING AND AT BEDTIME 180 tablet 0  . Ascorbic Acid (VITAMIN C) 1000 MG tablet Take 1,000 mg by mouth daily.    Marland Kitchen aspirin EC 81 MG tablet Take 1 tablet (81 mg total) by mouth daily. Swallow whole. 90 tablet 3  . atorvastatin (LIPITOR) 40 MG tablet TAKE 1 TABLET BY MOUTH AT BEDTIME 90 tablet 2  . Bioflavonoid Products (BIOFLEX) TABS Take 2 tablets by mouth daily.    . blood glucose meter kit and supplies Dispense based on patient and insurance preference. Use up to four times daily as directed. (FOR ICD-10 E10.9, E11.9). 1 each 0  . Blood Glucose Monitoring Suppl (ONETOUCH VERIO) w/Device KIT Use up to four  times daily as directed. 1 kit 1  . Calcium Polycarbophil (FIBER-CAPS PO) Take 2 capsules by mouth daily.    . Cholecalciferol (VITAMIN D) 50 MCG (2000 UT) tablet Take 2,000 Units by mouth daily.    . fluticasone (FLONASE) 50 MCG/ACT nasal spray Place 2 sprays into both nostrils daily. (Patient taking differently: Place 2 sprays into both nostrils at bedtime as needed for allergies.) 16 g 1  . glucose blood (ONETOUCH VERIO) test strip Use as instructed 100 each 12  . loratadine (CLARITIN) 10 MG tablet Take 1 tablet (10 mg total) by mouth daily. 90 tablet 1  . magnesium oxide (MAG-OX) 400 (240 Mg) MG tablet Take 400 mg by mouth at bedtime.    . metFORMIN (GLUCOPHAGE) 500 MG tablet TAKE 1 TABLET BY MOUTH TWICE DAILY WITH A MEAL 180 tablet 1  . metFORMIN (GLUCOPHAGE) 500 MG tablet Take 1 tablet (500 mg total) by mouth 2 (two) times daily with a meal. 180 tablet 3  . metoprolol tartrate (LOPRESSOR) 25 MG tablet Take 1.5 tablets (37.5 mg total) by mouth 2 (two) times daily. 270 tablet 3  . Multiple Vitamins-Minerals (MULTIVITAMIN WITH MINERALS) tablet Take 1 tablet by mouth daily.    . nitroGLYCERIN (NITROSTAT) 0.4 MG SL tablet Place 1 tablet (0.4 mg total) under the tongue every 5 (five) minutes as needed for chest pain. 50 tablet 3  . OneTouch Delica Lancets 33G MISC Use as instructed 100 each 11  . sildenafil (VIAGRA) 100 MG tablet TAKE 1 TABLET BY MOUTH ONCE DAILY AS NEEDED FOR ERECTILE DYSFUNCTION 4 tablet 0  . Vibegron (GEMTESA) 75 MG TABS Take 75 mg by mouth daily.    Marland Kitchen zolpidem (AMBIEN) 10 MG tablet TAKE 1/2 TO 1 (ONE-HALF TO ONE) TABLET BY MOUTH AT BEDTIME AS NEEDED FOR SLEEP 30 tablet 0  . doxycycline (VIBRA-TABS) 100 MG tablet Take 1 tablet (100 mg total) by mouth 2 (two) times daily. 20 tablet 0   No facility-administered medications prior to visit.    No Known Allergies  Review of Systems  Constitutional:  Negative for chills, fever and malaise/fatigue.  HENT:  Negative for  congestion and hearing loss.   Eyes:  Negative for discharge.  Respiratory:  Negative for cough, sputum production and shortness of breath.   Cardiovascular:  Negative for chest pain, palpitations and leg swelling.  Gastrointestinal:  Negative for abdominal pain, blood in stool, constipation, diarrhea, heartburn, nausea and vomiting.  Genitourinary:  Negative for dysuria, frequency, hematuria and urgency.  Musculoskeletal:  Negative for back pain, falls and myalgias.  Skin:  Negative for rash.  Neurological:  Negative for dizziness, sensory change, loss of consciousness, weakness and headaches.  Endo/Heme/Allergies:  Negative for environmental allergies. Does not bruise/bleed easily.  Psychiatric/Behavioral:  Negative for depression and suicidal ideas. The patient is not nervous/anxious and does not have insomnia.       Objective:    Physical Exam Vitals reviewed.  Constitutional:      General: He is not in acute distress.    Appearance: Normal appearance. He is not ill-appearing or diaphoretic.  HENT:     Head: Normocephalic and atraumatic.     Right Ear: Tympanic membrane, ear canal and external ear normal. There is no impacted cerumen.     Left Ear: Tympanic membrane, ear canal and external ear normal. There is no impacted cerumen.     Nose: Nose normal. No rhinorrhea.     Mouth/Throat:     Pharynx: Oropharynx is clear.  Eyes:     General: No scleral icterus.    Extraocular Movements: Extraocular movements intact.     Conjunctiva/sclera: Conjunctivae normal.     Pupils: Pupils are equal, round, and reactive to light.  Neck:     Thyroid: No thyroid mass or thyroid tenderness.  Cardiovascular:     Rate and Rhythm: Normal rate and regular rhythm.     Pulses: Normal pulses.     Heart sounds: Normal heart sounds. No murmur heard. Pulmonary:     Effort: Pulmonary effort is normal.     Breath sounds: Normal breath sounds. No wheezing.  Abdominal:     General: Bowel sounds are  normal.     Palpations: Abdomen is soft. There is no mass.     Tenderness: There is no guarding.  Musculoskeletal:        General: No swelling. Normal range of motion.     Cervical back: Normal range of motion and neck supple. No rigidity.     Right lower leg: No edema.     Left lower leg: No edema.  Lymphadenopathy:     Cervical: No cervical adenopathy.  Skin:    General: Skin is warm and dry.     Findings: No rash.  Neurological:     General: No focal deficit present.     Mental Status: He is alert and oriented to person, place, and time.     Cranial Nerves: No cranial nerve deficit.     Deep Tendon Reflexes: Reflexes normal.  Psychiatric:        Mood and Affect: Mood normal.        Behavior: Behavior normal.   There were no vitals taken for this visit. Wt Readings from Last 3 Encounters:  11/28/22 227 lb (103 kg)  09/26/22 231 lb (104.8 kg)  07/07/22 240 lb (108.9 kg)    Diabetic Foot Exam - Simple   No data filed    Lab Results  Component Value Date   WBC 9.5 06/23/2022   HGB 14.0 06/23/2022   HCT 42.3 06/23/2022   PLT 252 06/23/2022   GLUCOSE 119 (H) 06/23/2022   CHOL 127 11/28/2022   TRIG 93 11/28/2022   HDL 44 11/28/2022   LDLDIRECT 74.0 05/26/2022   LDLCALC 65 11/28/2022   ALT 26 05/26/2022   AST 21 05/26/2022   NA 139 06/23/2022   K 3.6 06/23/2022   CL 99 06/23/2022   CREATININE 0.96 06/23/2022   BUN 24 06/23/2022   CO2 22  06/23/2022   TSH 1.00 05/26/2022   PSA 6.36 06/18/2020   HGBA1C 6.6 (H) 11/28/2022   MICROALBUR 3.0 (H) 05/26/2022    Lab Results  Component Value Date   TSH 1.00 05/26/2022   Lab Results  Component Value Date   WBC 9.5 06/23/2022   HGB 14.0 06/23/2022   HCT 42.3 06/23/2022   MCV 87 06/23/2022   PLT 252 06/23/2022   Lab Results  Component Value Date   NA 139 06/23/2022   K 3.6 06/23/2022   CO2 22 06/23/2022   GLUCOSE 119 (H) 06/23/2022   BUN 24 06/23/2022   CREATININE 0.96 06/23/2022   BILITOT 0.9 05/26/2022    ALKPHOS 46 05/26/2022   AST 21 05/26/2022   ALT 26 05/26/2022   PROT 7.2 05/26/2022   ALBUMIN 4.5 05/26/2022   CALCIUM 10.2 06/23/2022   ANIONGAP 10 10/05/2020   EGFR 88 06/23/2022   GFR 78.78 05/26/2022   Lab Results  Component Value Date   CHOL 127 11/28/2022   Lab Results  Component Value Date   HDL 44 11/28/2022   Lab Results  Component Value Date   LDLCALC 65 11/28/2022   Lab Results  Component Value Date   TRIG 93 11/28/2022   Lab Results  Component Value Date   CHOLHDL 2.9 11/28/2022   Lab Results  Component Value Date   HGBA1C 6.6 (H) 11/28/2022       Assessment & Plan:  Primary hypertension Assessment & Plan: Mildly elevated but patient very stressed with running around today, no changes to meds. Encouraged heart healthy diet such as the DASH diet and exercise as tolerated. He is encouraged to monitor his resting BP at home and report them to [place] so we can adjust meds if they remain elevated at home   Erectile dysfunction, unspecified erectile dysfunction type Assessment & Plan: Can continue Viagra prn   Preventative health care Assessment & Plan: Patient encouraged to maintain heart healthy diet, regular exercise, adequate sleep. Consider daily probiotics. Take medications as prescribed. Labs ordered and reviewed. Encouraged to consider shingrix shots but so far he declines this and covid shots. Will consider Shingrix shots later. Declines colonoscopy for now. Given and reviewed copy of ACP documents from [facility] [facility] Secretary of State and encouraged to complete and return    DM type 2 with diabetic mixed hyperlipidemia (HCC) Assessment & Plan: hgba1c acceptable, minimize simple carbs. Increase exercise as tolerated. Continue current meds    Prostate cancer [facility]) Assessment & Plan: Following with urology   Hyperlipidemia, mixed Assessment & Plan: Encourage heart healthy diet such as MIND or DASH diet, increase exercise, avoid trans fats, simple  carbohydrates and processed foods, consider a krill or fish or flaxseed oil cap daily. Tolerating Lipitor     Assessment and Plan              Danise Edge, MD

## 2023-06-13 NOTE — Assessment & Plan Note (Signed)
Encouraged good sleep hygiene such as dark, quiet room. No blue/green glowing lights such as computer screens in bedroom. No alcohol or stimulants in evening. Cut down on caffeine as able. Regular exercise is helpful but not just prior to bed time.  He is using Ambien prn and does not need it most days

## 2023-06-13 NOTE — Assessment & Plan Note (Addendum)
hgba1c acceptable, minimize simple carbs. Increase exercise as tolerated. Continue current meds, his Metformin was increased to 1000 mg twice daily. Refill given

## 2023-06-13 NOTE — Assessment & Plan Note (Addendum)
Struggles worse since his prostectomy. Viagra no longer effective

## 2023-06-13 NOTE — Assessment & Plan Note (Signed)
Mildly elevated but patient very stressed with running around today, no changes to meds. Encouraged heart healthy diet such as the DASH diet and exercise as tolerated. He is encouraged to monitor his resting BP at home and report them to us so we can adjust meds if they remain elevated at home 

## 2023-06-13 NOTE — Assessment & Plan Note (Addendum)
Following with urology, Dr Laverle Patter is s/p prostatectomy and is doing well

## 2023-06-13 NOTE — Assessment & Plan Note (Signed)
Encourage heart healthy diet such as MIND or DASH diet, increase exercise, avoid trans fats, simple carbohydrates and processed foods, consider a krill or fish or flaxseed oil cap daily. Tolerating Lipitor 

## 2023-06-13 NOTE — Assessment & Plan Note (Signed)
Patient encouraged to maintain heart healthy diet, regular exercise, adequate sleep. Consider daily probiotics. Take medications as prescribed. Labs ordered and reviewed. Encouraged to consider shingrix shots but so far he declines this and covid shots. Will consider Shingrix shots later. Declines colonoscopy for now. Given and reviewed copy of ACP documents from [employer] and encouraged to complete and return

## 2023-06-14 ENCOUNTER — Other Ambulatory Visit: Payer: Self-pay | Admitting: Family Medicine

## 2023-06-14 ENCOUNTER — Encounter: Payer: Self-pay | Admitting: Family Medicine

## 2023-06-14 LAB — DRUG MONITORING PANEL 376104, URINE
Amphetamines: NEGATIVE ng/mL (ref <500)
Barbiturates: NEGATIVE ng/mL (ref <300)
Benzodiazepines: NEGATIVE ng/mL (ref <100)
Cocaine Metabolite: NEGATIVE ng/mL (ref <150)
Desmethyltramadol: NEGATIVE ng/mL (ref <100)
Opiates: NEGATIVE ng/mL (ref <100)
Oxycodone: NEGATIVE ng/mL (ref <100)
Tramadol: NEGATIVE ng/mL (ref <100)

## 2023-06-14 LAB — DM TEMPLATE

## 2023-06-14 MED ORDER — OXYBUTYNIN CHLORIDE ER 10 MG PO TB24
10.0000 mg | ORAL_TABLET | Freq: Every day | ORAL | 3 refills | Status: DC
Start: 2023-06-14 — End: 2024-01-04

## 2023-06-20 ENCOUNTER — Encounter: Payer: Self-pay | Admitting: Family Medicine

## 2023-07-12 ENCOUNTER — Other Ambulatory Visit: Payer: Self-pay | Admitting: Family Medicine

## 2023-07-12 DIAGNOSIS — I10 Essential (primary) hypertension: Secondary | ICD-10-CM

## 2023-07-25 DIAGNOSIS — C61 Malignant neoplasm of prostate: Secondary | ICD-10-CM | POA: Diagnosis not present

## 2023-07-25 DIAGNOSIS — N5201 Erectile dysfunction due to arterial insufficiency: Secondary | ICD-10-CM | POA: Diagnosis not present

## 2023-07-25 DIAGNOSIS — N393 Stress incontinence (female) (male): Secondary | ICD-10-CM | POA: Diagnosis not present

## 2023-08-04 ENCOUNTER — Other Ambulatory Visit: Payer: Self-pay | Admitting: Family Medicine

## 2023-08-27 DIAGNOSIS — D099 Carcinoma in situ, unspecified: Secondary | ICD-10-CM

## 2023-08-27 DIAGNOSIS — C4491 Basal cell carcinoma of skin, unspecified: Secondary | ICD-10-CM

## 2023-08-27 HISTORY — DX: Carcinoma in situ, unspecified: D09.9

## 2023-08-27 HISTORY — DX: Basal cell carcinoma of skin, unspecified: C44.91

## 2023-09-04 IMAGING — CT CT HEART MORP W/ CTA COR W/ SCORE W/ CA W/CM &/OR W/O CM
4 of 7 series · 8 of 20 positions shown, 9 images · IV contrast (APPLIED)
Comparison: None Available.
COMPARISON: None Available.

Addendum:
EXAM:
OVER-READ INTERPRETATION  CT CHEST

The following report is a limited chest CT over-read performed by
06/08/2022. The CTA interpretation by the cardiologist is attached.
HISTORY: Chest pain/anginal equiv, intermediate CAD risk, not treadmill
candidate
Cardiac/Coronary CT
TECHNIQUE: The patient was scanned on a Siemens Force scanner.
PROTOCOL: A 100 kV prospective scan was triggered in the descending thoracic
aorta at 111 HU's. Axial non-contrast 3 mm slices were carried out
through the heart. The data set was analyzed on a dedicated work
station and scored using the Agatston method. Gantry rotation speed
was 250 msecs and collimation was .6 mm. Heart rate was optimized
medically and sl NTG was given. The 3D data set was reconstructed in
5% intervals of the 35-75 % of the R-R cycle. Systolic and diastolic
phases were analyzed on a dedicated work station using MPR, MIP and
VRT modes. The patient received 100mL OMNIPAQUE IOHEXOL 350 MG/ML
SOLN of contrast.

[Series 6: ts diast sharp · axial · 0.39mm/px · z∈[-46,-11]mm · 2 of 263 slices shown]
[im 88/263  lung]
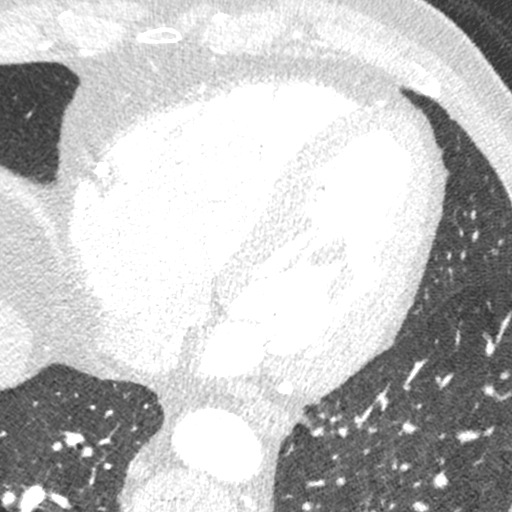
[im 175/263  lung]
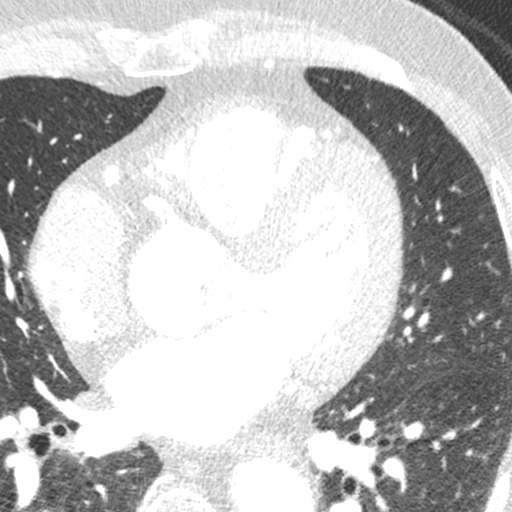

[Series 7: best diast · axial · 0.39mm/px · z∈[-46,-11]mm · 2 of 263 slices shown, 3 images]
[im 88/263  vessel]
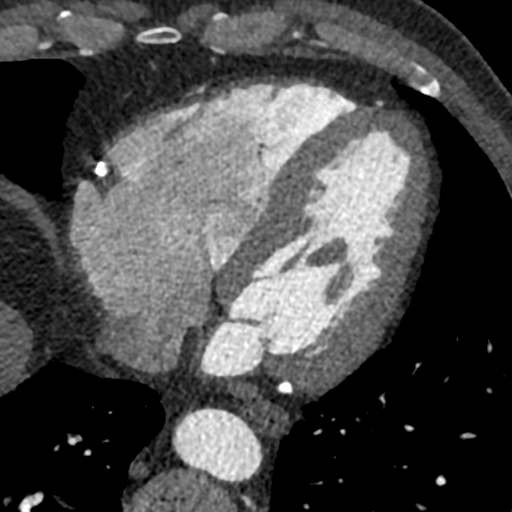
[im 88/263  lung]
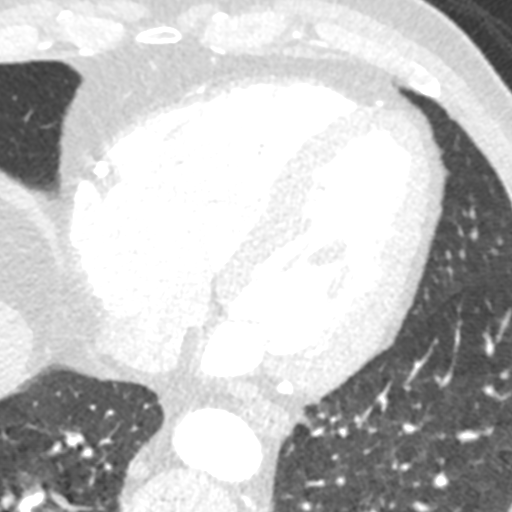
[im 175/263  vessel]
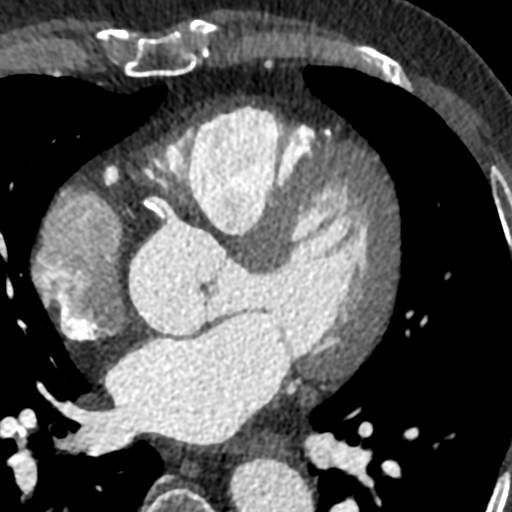

[Series 8: ts syst sharp · axial · 0.39mm/px · z∈[-46,-11]mm · 2 of 263 slices shown]
[im 88/263  lung]
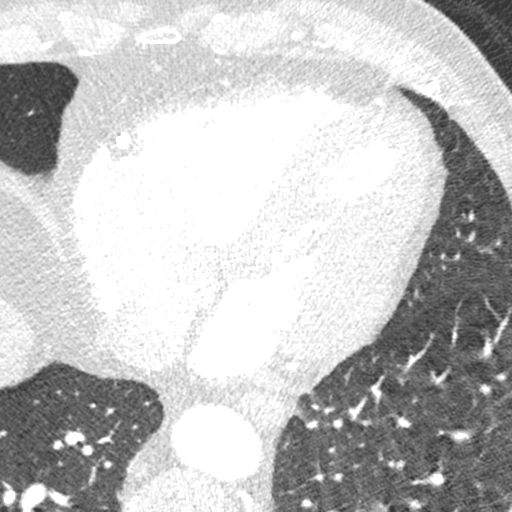
[im 175/263  lung]
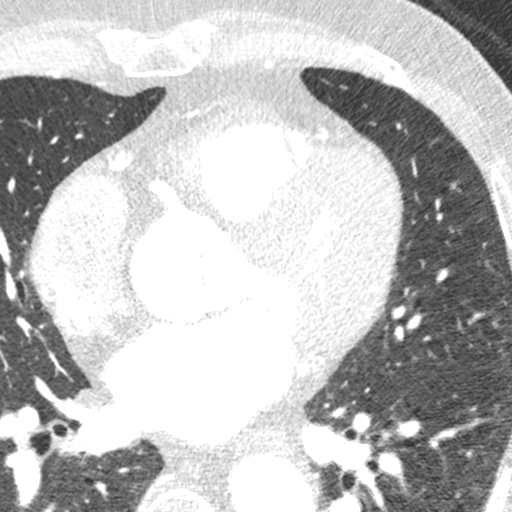

[Series 9: best syst · axial · 0.39mm/px · z∈[-46,-11]mm · 2 of 263 slices shown]
[im 88/263  vessel]
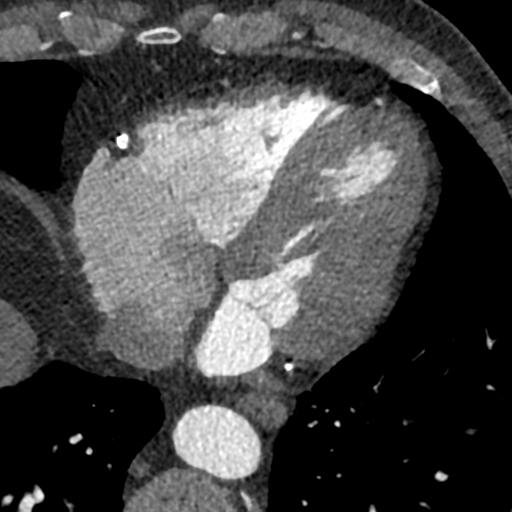
[im 175/263  vessel]
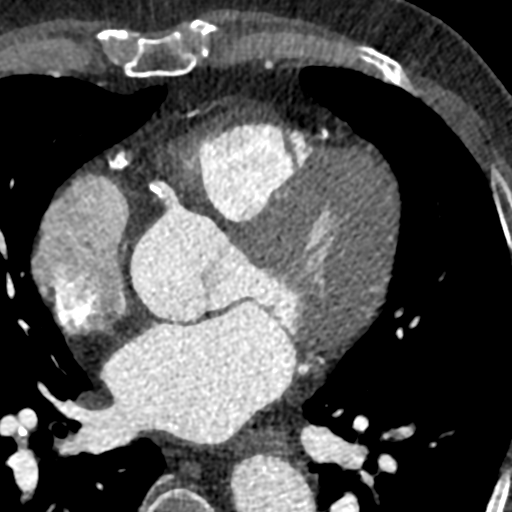

[8 of 20 positions shown; findings below may reference images not displayed]

FINDINGS: Limited view of the lung parenchyma demonstrates no suspicious
nodularity. Airways are normal.

Limited view of the mediastinum demonstrates no adenopathy.
Esophagus normal.

Limited view of the upper abdomen unremarkable.

Limited view of the skeleton and chest wall is unremarkable.
IMPRESSION: No significant extracardiac findings.
FINDINGS: Coronary calcium score: The patient's coronary artery calcium score
is 1881, which places the patient in the 99th percentile.

Coronary arteries: Normal coronary origins.  Right dominance.

Right Coronary Artery: Normal caliber vessel, gives rise to PDA.
Diffuse calcified plaque throughout. Concern for 70-99% lesion in
proximal RCA. Heavy calcification focally and at least 50-69%
stenosis in both mid and distal RCA.

Left Main Coronary Artery: Normal caliber vessel. Noncalcified
plaque noted with 25-49% stenosis distally.

Left Anterior Descending Coronary Artery: Normal caliber vessel.
Diffuse calcified plaque throughout. Maximum stenosis of 70-99% in
mid LAD. Gives rise to branching first and regular second diagonal
branches, with significant stenosis in both branches (each with >50%
stenosis).

Left Circumflex Artery: Normal caliber vessel. Diffuse calcified
plaque throughout. Maximum stenosis 70-99% in distal LCx. Gives rise
to 2 small OM branches.

Aorta: Normal size, 37.7 mm at the mid ascending aorta (level of the
PA bifurcation) measured double oblique. Diffuse aortic
atherosclerosis. No dissection seen in visualized portions of the
aorta.

Aortic Valve: No calcifications. Trileaflet.

Other findings:

Normal pulmonary vein drainage into the left atrium.

Normal left atrial appendage without a thrombus.

Normal size of the pulmonary artery.

Normal appearance of the pericardium.
IMPRESSION: 1. Severe calcified 3 vessel CAD, CADRADS=4. CT FFR will be
performed and reported separately.

2. Coronary calcium score of 1881. This was 99th percentile for age
and sex matched control.

3. Normal coronary origin with right dominance.

INTERPRETATION:

1. CAD-RADS 0: No evidence of CAD (0%). Consider non-atherosclerotic
causes of chest pain.

2. CAD-RADS 1: Minimal non-obstructive CAD (0-24%). Consider
non-atherosclerotic causes of chest pain. Consider preventive
therapy and risk factor modification.

3. CAD-RADS 2: Mild non-obstructive CAD (25-49%). Consider
non-atherosclerotic causes of chest pain. Consider preventive
therapy and risk factor modification.

4. CAD-RADS 3: Moderate stenosis (50-69%). Consider symptom-guided
anti-ischemic pharmacotherapy as well as risk factor modification
per guideline directed care. Additional analysis with CT FFR will be
submitted.

5. CAD-RADS 4: Severe stenosis. (70-99% or > 50% left main). Cardiac
catheterization or CT FFR is recommended. Consider symptom-guided
anti-ischemic pharmacotherapy as well as risk factor modification
per guideline directed care. Invasive coronary angiography
recommended with revascularization per published guideline
statements.

6. CAD-RADS 5: Total coronary occlusion (100%). Consider cardiac
catheterization or viability assessment. Consider symptom-guided
anti-ischemic pharmacotherapy as well as risk factor modification
per guideline directed care.

7. CAD-RADS N: Non-diagnostic study. Obstructive CAD can't be
excluded. Alternative evaluation is recommended.

*** End of Addendum ***
EXAM:
OVER-READ INTERPRETATION  CT CHEST

The following report is a limited chest CT over-read performed by
06/08/2022. The CTA interpretation by the cardiologist is attached.
FINDINGS: Limited view of the lung parenchyma demonstrates no suspicious
nodularity. Airways are normal.

Limited view of the mediastinum demonstrates no adenopathy.
Esophagus normal.

Limited view of the upper abdomen unremarkable.

Limited view of the skeleton and chest wall is unremarkable.
IMPRESSION: No significant extracardiac findings.

## 2023-09-14 ENCOUNTER — Ambulatory Visit (INDEPENDENT_AMBULATORY_CARE_PROVIDER_SITE_OTHER): Payer: Medicare HMO | Admitting: Dermatology

## 2023-09-14 ENCOUNTER — Encounter: Payer: Self-pay | Admitting: Dermatology

## 2023-09-14 DIAGNOSIS — D0439 Carcinoma in situ of skin of other parts of face: Secondary | ICD-10-CM

## 2023-09-14 DIAGNOSIS — W908XXA Exposure to other nonionizing radiation, initial encounter: Secondary | ICD-10-CM

## 2023-09-14 DIAGNOSIS — L821 Other seborrheic keratosis: Secondary | ICD-10-CM

## 2023-09-14 DIAGNOSIS — C4441 Basal cell carcinoma of skin of scalp and neck: Secondary | ICD-10-CM | POA: Diagnosis not present

## 2023-09-14 DIAGNOSIS — L570 Actinic keratosis: Secondary | ICD-10-CM

## 2023-09-14 DIAGNOSIS — D1801 Hemangioma of skin and subcutaneous tissue: Secondary | ICD-10-CM

## 2023-09-14 DIAGNOSIS — L814 Other melanin hyperpigmentation: Secondary | ICD-10-CM

## 2023-09-14 DIAGNOSIS — Z1283 Encounter for screening for malignant neoplasm of skin: Secondary | ICD-10-CM

## 2023-09-14 DIAGNOSIS — L820 Inflamed seborrheic keratosis: Secondary | ICD-10-CM | POA: Diagnosis not present

## 2023-09-14 DIAGNOSIS — D485 Neoplasm of uncertain behavior of skin: Secondary | ICD-10-CM

## 2023-09-14 DIAGNOSIS — B360 Pityriasis versicolor: Secondary | ICD-10-CM | POA: Diagnosis not present

## 2023-09-14 DIAGNOSIS — L578 Other skin changes due to chronic exposure to nonionizing radiation: Secondary | ICD-10-CM

## 2023-09-14 DIAGNOSIS — D229 Melanocytic nevi, unspecified: Secondary | ICD-10-CM

## 2023-09-14 NOTE — Patient Instructions (Addendum)

## 2023-09-14 NOTE — Progress Notes (Signed)
New Patient Visit   Subjective  Joseph Fowler is a 67 y.o. male who presents for the following: Skin Cancer Screening and Upper Body Skin Exam - No personal or family history of skin cancer. He has a spot on his left cheek that has been for about a year.  The patient presents for Upper Body Skin Exam (UBSE) for skin cancer screening and mole check. The patient has spots, moles and lesions to be evaluated, some may be new or changing and the patient may have concern these could be cancer.  The following portions of the chart were reviewed this encounter and updated as appropriate: medications, allergies, medical history  Review of Systems:  No other skin or systemic complaints except as noted in HPI or Assessment and Plan.  Objective  Well appearing patient in no apparent distress; mood and affect are within normal limits.  All skin waist up examined. Relevant physical exam findings are noted in the Assessment and Plan.  Left cheek 0.8 x 0.8 cm pink gritty papule with central scale     Right nose 0.5 x 0.5 cm pink papule with hyperkeratosis     Left lat neck 0.5 cm pink pearly papule     Right elbow x 1, left elbow x 1 (2) Erythematous stuck-on, waxy papule or plaque  Left arm/hand x 2, right arm/hand x 2 (4) Erythematous thin papules/macules with gritty scale.    Assessment & Plan   Neoplasm of uncertain behavior of skin (3) Left cheek  Skin / nail biopsy Type of biopsy: tangential   Informed consent: discussed and consent obtained   Timeout: patient name, date of birth, surgical site, and procedure verified   Procedure prep:  Patient was prepped and draped in usual sterile fashion Prep type:  Isopropyl alcohol Anesthesia: the lesion was anesthetized in a standard fashion   Anesthetic:  1% lidocaine w/ epinephrine 1-100,000 buffered w/ 8.4% NaHCO3 Instrument used: flexible razor blade   Hemostasis achieved with: pressure, aluminum chloride and  electrodesiccation   Outcome: patient tolerated procedure well   Post-procedure details: sterile dressing applied and wound care instructions given   Dressing type: bandage and petrolatum    Specimen 1 - Surgical pathology Differential Diagnosis: R/O SCC Check Margins: No  Right nose  Skin / nail biopsy Type of biopsy: tangential   Informed consent: discussed and consent obtained   Timeout: patient name, date of birth, surgical site, and procedure verified   Procedure prep:  Patient was prepped and draped in usual sterile fashion Prep type:  Isopropyl alcohol Anesthesia: the lesion was anesthetized in a standard fashion   Anesthetic:  1% lidocaine w/ epinephrine 1-100,000 buffered w/ 8.4% NaHCO3 Instrument used: flexible razor blade   Hemostasis achieved with: pressure, aluminum chloride and electrodesiccation   Outcome: patient tolerated procedure well   Post-procedure details: sterile dressing applied and wound care instructions given   Dressing type: bandage and petrolatum    Left lat neck  Skin / nail biopsy Type of biopsy: tangential   Informed consent: discussed and consent obtained   Timeout: patient name, date of birth, surgical site, and procedure verified   Procedure prep:  Patient was prepped and draped in usual sterile fashion Prep type:  Isopropyl alcohol Anesthesia: the lesion was anesthetized in a standard fashion   Anesthetic:  1% lidocaine w/ epinephrine 1-100,000 buffered w/ 8.4% NaHCO3 Instrument used: flexible razor blade   Hemostasis achieved with: pressure, aluminum chloride and electrodesiccation   Outcome: patient tolerated  procedure well   Post-procedure details: sterile dressing applied and wound care instructions given   Dressing type: bandage and petrolatum    Inflamed seborrheic keratosis (2) Right elbow x 1, left elbow x 1  Symptomatic, irritating, patient would like treated.  Benign-appearing.  Call clinic for new or changing lesions.    Destruction of lesion - Right elbow x 1, left elbow x 1 (2) Complexity: simple   Destruction method: cryotherapy   Informed consent: discussed and consent obtained   Timeout:  patient name, date of birth, surgical site, and procedure verified Lesion destroyed using liquid nitrogen: Yes   Region frozen until ice ball extended beyond lesion: Yes   Outcome: patient tolerated procedure well with no complications   Post-procedure details: wound care instructions given    AK (actinic keratosis) (4) Left arm/hand x 2, right arm/hand x 2  Destruction of lesion - Left arm/hand x 2, right arm/hand x 2 (4) Complexity: simple   Destruction method: cryotherapy   Informed consent: discussed and consent obtained   Timeout:  patient name, date of birth, surgical site, and procedure verified Lesion destroyed using liquid nitrogen: Yes   Region frozen until ice ball extended beyond lesion: Yes   Outcome: patient tolerated procedure well with no complications   Post-procedure details: wound care instructions given    Skin cancer screening performed today.  Actinic Damage - Chronic condition, secondary to cumulative UV/sun exposure - diffuse scaly erythematous macules with underlying dyspigmentation - Recommend daily broad spectrum sunscreen SPF 30+ to sun-exposed areas, reapply every 2 hours as needed.  - Staying in the shade or wearing long sleeves, sun glasses (UVA+UVB protection) and wide brim hats (4-inch brim around the entire circumference of the hat) are also recommended for sun protection.  - Call for new or changing lesions.  Lentigines, Seborrheic Keratoses, Cherry angiomas - Benign normal skin lesions - Benign-appearing - Call for any changes  Melanocytic Nevi - Tan-brown and/or pink-flesh-colored symmetric macules and papules - Benign appearing on exam today - Observation - Call clinic for new or changing moles - Recommend daily use of broad spectrum spf 30+ sunscreen to  sun-exposed areas.   Tinea Versicolor of neck   Tinea versicolor is a chronic recurrent skin rash causing discolored scaly spots most commonly seen on back, chest, and/or shoulders.  It is generally asymptomatic. The rash is due to overgrowth of a common type of yeast present on everyone's skin and it is not contagious.  It tends to flare more in the summer due to increased sweating on trunk.  After rash is treated, the scaliness will resolve, but the discoloration will take longer to return to normal pigmentation. The periodic use of an OTC medicated soap/shampoo with zinc or selenium sulfide can be helpful to prevent yeast overgrowth and recurrence. - Offered ketoconazole shampoo Rx, pt declines for now  Return in about 6 months (around 03/13/2024) for AK follow up.  I, Joanie Coddington, CMA, am acting as scribe for Gwenith Daily, MD .   Documentation: I have reviewed the above documentation for accuracy and completeness, and I agree with the above.  Gwenith Daily, MD

## 2023-09-15 LAB — SURGICAL PATHOLOGY

## 2023-09-19 ENCOUNTER — Telehealth: Payer: Self-pay

## 2023-09-19 NOTE — Telephone Encounter (Signed)
-----   Message from [facility] PACI sent at 09/18/2023 11:02 AM EDT ----- 1. 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 AK- right nose- return for cryo 3. BCC-left lateral neck- Mohs with me  Please call patient to discuss diagnosis and schedule for Mohs surgery and cryo for the other 2 spots.

## 2023-09-19 NOTE — Telephone Encounter (Signed)
Spoke with pt and gave him bx results and recommendation. Pt is requesting anxiety med pre surgery

## 2023-09-28 ENCOUNTER — Encounter: Payer: Medicare HMO | Admitting: Dermatology

## 2023-09-29 ENCOUNTER — Other Ambulatory Visit: Payer: Self-pay | Admitting: Family Medicine

## 2023-09-29 DIAGNOSIS — Z1212 Encounter for screening for malignant neoplasm of rectum: Secondary | ICD-10-CM

## 2023-09-29 DIAGNOSIS — Z1211 Encounter for screening for malignant neoplasm of colon: Secondary | ICD-10-CM

## 2023-10-05 ENCOUNTER — Ambulatory Visit: Payer: Medicare HMO | Admitting: Dermatology

## 2023-10-05 ENCOUNTER — Encounter: Payer: Self-pay | Admitting: Dermatology

## 2023-10-05 VITALS — BP 142/86 | HR 65 | Temp 98.2°F

## 2023-10-05 DIAGNOSIS — L814 Other melanin hyperpigmentation: Secondary | ICD-10-CM | POA: Diagnosis not present

## 2023-10-05 DIAGNOSIS — D0439 Carcinoma in situ of skin of other parts of face: Secondary | ICD-10-CM | POA: Diagnosis not present

## 2023-10-05 DIAGNOSIS — C4491 Basal cell carcinoma of skin, unspecified: Secondary | ICD-10-CM | POA: Diagnosis not present

## 2023-10-05 DIAGNOSIS — L570 Actinic keratosis: Secondary | ICD-10-CM | POA: Diagnosis not present

## 2023-10-05 DIAGNOSIS — D099 Carcinoma in situ, unspecified: Secondary | ICD-10-CM

## 2023-10-05 DIAGNOSIS — L579 Skin changes due to chronic exposure to nonionizing radiation, unspecified: Secondary | ICD-10-CM

## 2023-10-05 DIAGNOSIS — G8918 Other acute postprocedural pain: Secondary | ICD-10-CM

## 2023-10-05 MED ORDER — OXYCODONE HCL 5 MG PO CAPS: 5.0000 mg | ORAL_CAPSULE | Freq: Four times a day (QID) | ORAL | 0 refills | Status: DC | PRN

## 2023-10-05 NOTE — Progress Notes (Signed)
Follow-Up Visit   Subjective  Joseph Fowler is a 67 y.o. male who presents for the following: Mohs for [facility] on the left lateral neck  He is accompanied by his wife.   He also has an SCCIS in AK on the left cheek and an AK on the right nasal dorsum that need to be treated with cryotherapy today  The following portions of the chart were reviewed this encounter and updated as appropriate: medications, allergies, medical history  Review of Systems:  No other skin or systemic complaints except as noted in HPI or Assessment and Plan.  Objective  Well appearing patient in no apparent distress; mood and affect are within normal limits.  A focused examination was performed of the following areas: Left lateral neck Relevant physical exam findings are noted in the Assessment and Plan.   left lateral neck Pink pearly papule or plaque with arborizing vessels. Pink pearly papule or plaque with arborizing vessels.            Left Malar Cheek Pink scaly papule with biopsy scar  Right Nasal Sidewall Pink gritty papule with biopsy scar    Assessment & Plan   Basal cell carcinoma (BCC), unspecified site left lateral neck  Mohs surgery  Consent obtained: written  Anticoagulation: Is the patient taking prescription anticoagulant and/or aspirin prescribed/recommended by a physician? No   Was the anticoagulation regimen changed prior to Mohs? No    Procedure Details: Surgical site (from skin exam): left lateral neck Pre-operative length (cm): 1.5 Pre-operative width (cm): 0.9 Mohs Appropriate Use Criteria Score: 1  Micrographic Surgery Details: Post-operative length (cm): 1.6 Post-operative width (cm): 1 Number of Mohs stages: 1  Related Medications oxycodone (OXY-IR) 5 MG capsule Take 1 capsule (5 mg total) by mouth every 6 (six) hours as needed for up to 10 doses.  Postoperative pain  Related Medications oxycodone (OXY-IR) 5 MG capsule Take 1 capsule (5 mg  total) by mouth every 6 (six) hours as needed for up to 10 doses.  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  Owens Shark, CMA, am acting as scribe for Gwenith Daily, MD.    10/05/2023  HISTORY OF PRESENT ILLNESS  Joseph Fowler is seen in consultation at the request of Dr. Caralyn Guile for biopsy-proven [facility] on the left neck. They note that the area has been present for about 1 year increasing in size.  There is no history of previous treatment.  Reports no other new or changing lesions and has no other complaints today.  He is accompanied by his wife. He also has a bx proven SCCIS in AK on the left cheek and an AK on the left nasal sidewall.  Medications and allergies: see patient chart.  Review of systems: Reviewed 8 systems and notable for the above skin cancer.  All other systems reviewed are unremarkable/negative, unless noted in the HPI. Past medical history, surgical history, family history,  social history were also reviewed and are noted in the chart/questionnaire.    PHYSICAL EXAMINATION  General: Well-appearing, in no acute distress, alert and oriented x 4. Vitals reviewed in chart (if available).   Skin: Exam reveals a 1.5 x 0.9 cm erythematous papule and biopsy scar on the left neck. There are rhytids, telangiectasias, and lentigines, consistent with photodamage.  Biopsy report(s) reviewed, confirming the  diagnosis.  ASSESSMENT  1) Basal Cell Carcinoma on the left neck 2) photodamage 3) solar lentigines   PLAN   1. Due to location, size, histology, or recurrence and the likelihood of subclinical extension as well as the need to conserve normal surrounding tissue, the patient was deemed acceptable for Mohs micrographic surgery (MMS).  The nature and purpose of the procedure, associated benefits and risks including recurrence and scarring, possible complications such as pain, infection, and bleeding, and alternative methods of treatment if appropriate were discussed with the patient during consent. The lesion location was verified by the patient, by reviewing previous notes, pathology reports, and by photographs as well as angulation measurements if available.  Informed consent was reviewed and signed by the patient, and timeout was performed at 10:45 AM. See op note below.  2. For the photodamage and solar lentigines, sun protection discussed/information given on OTC sunscreens, and we recommend continued regular follow-up with primary dermatologist every 6 months or sooner for any growing, bleeding, or changing lesions. 3. Prognosis and future surveillance discussed. 4. Letter with treatment outcome sent to referring provider. 5. Pain acetaminophen/ibuprofen/oxycodone 5 mg 6. LN to AK on right nasal dorsum and left cheek  MOHS MICROGRAPHIC SURGERY AND RECONSTRUCTION  Initial size:   1.5 cm x 0.9 Surgical defect/wound size: 1.6 x 1.0 cm Anesthesia:    0.33% lidocaine with 1:200,000 epinephrine EBL:    <5 mL Complications:  None Repair type:   Complex SQ suture:   5-0 Monocryl Cutaneous suture:  6-0 Plain gut Final size of the repair: 4.8 cm  Stages: 1  STAGE I: Anesthesia achieved with 0.5% lidocaine with 1:200,000 epinephrine. ChloraPrep applied. 1 section(s) excised using Mohs technique (this includes total peripheral and deep tissue margin excision and evaluation with frozen sections,  excised and interpreted by the same physician). The tumor was first debulked and then excised with an approx. 2mm margin.  Hemostasis was achieved with electrocautery as needed.  The specimen was then oriented, subdivided/relaxed, inked, and processed using Mohs technique.    Frozen section analysis revealed a clear margin.   Reconstruction  The surgical wound was then cleaned, prepped, and re-anesthetized as above. Wound edges were undermined extensively along at least one entire edge and at a distance equal to or greater than the width of the defect (see wound defect size above) in order to achieve closure and decrease wound tension and anatomic distortion. Redundant tissue repair including standing cone removal was performed. Hemostasis was achieved with electrocautery. Subcutaneous and epidermal tissues were approximated with the above sutures. The surgical site was then lightly scrubbed with sterile, saline-soaked gauze. Steri-strips were applied, and the area was then bandaged using Vaseline ointment, non-adherent gauze, gauze pads, and tape to provide an adequate pressure dressing. The patient tolerated the procedure well, was given detailed written and verbal wound care instructions, and was discharged in good condition.   The patient will follow-up: 4 weeks.   Documentation: I have reviewed the above documentation for accuracy and completeness, and I agree with the above.  Gwenith Daily, MD

## 2023-10-11 ENCOUNTER — Other Ambulatory Visit: Payer: Self-pay | Admitting: Family

## 2023-10-17 DIAGNOSIS — Z0100 Encounter for examination of eyes and vision without abnormal findings: Secondary | ICD-10-CM | POA: Diagnosis not present

## 2023-10-17 LAB — HM DIABETES EYE EXAM

## 2023-10-19 ENCOUNTER — Other Ambulatory Visit: Payer: Self-pay | Admitting: Emergency Medicine

## 2023-10-20 ENCOUNTER — Other Ambulatory Visit: Payer: Self-pay | Admitting: Family Medicine

## 2023-10-20 DIAGNOSIS — I10 Essential (primary) hypertension: Secondary | ICD-10-CM

## 2023-10-25 ENCOUNTER — Other Ambulatory Visit (HOSPITAL_COMMUNITY): Payer: Self-pay

## 2023-10-25 ENCOUNTER — Other Ambulatory Visit: Payer: Self-pay

## 2023-10-25 MED ORDER — ATORVASTATIN CALCIUM 40 MG PO TABS
40.0000 mg | ORAL_TABLET | Freq: Every day | ORAL | 2 refills | Status: DC
  Filled 2023-10-25: qty 90, 90d supply, fill #0

## 2023-10-26 DIAGNOSIS — J020 Streptococcal pharyngitis: Secondary | ICD-10-CM | POA: Diagnosis not present

## 2023-10-26 DIAGNOSIS — Z03818 Encounter for observation for suspected exposure to other biological agents ruled out: Secondary | ICD-10-CM | POA: Diagnosis not present

## 2023-10-26 DIAGNOSIS — R0981 Nasal congestion: Secondary | ICD-10-CM | POA: Diagnosis not present

## 2023-10-26 DIAGNOSIS — R519 Headache, unspecified: Secondary | ICD-10-CM | POA: Diagnosis not present

## 2023-10-30 ENCOUNTER — Other Ambulatory Visit: Payer: Self-pay

## 2023-10-30 MED ORDER — ATORVASTATIN CALCIUM 40 MG PO TABS: 40.0000 mg | ORAL_TABLET | Freq: Every day | ORAL | 0 refills | Status: DC

## 2023-11-01 ENCOUNTER — Other Ambulatory Visit: Payer: Self-pay | Admitting: *Deleted

## 2023-11-01 ENCOUNTER — Other Ambulatory Visit: Payer: Self-pay | Admitting: Family Medicine

## 2023-11-01 DIAGNOSIS — I10 Essential (primary) hypertension: Secondary | ICD-10-CM

## 2023-11-01 MED ORDER — METOPROLOL TARTRATE 25 MG PO TABS: 37.5000 mg | ORAL_TABLET | Freq: Two times a day (BID) | ORAL | 0 refills | Status: DC

## 2023-11-01 MED ORDER — METFORMIN HCL 500 MG PO TABS: 1000.0000 mg | ORAL_TABLET | Freq: Two times a day (BID) | ORAL | 0 refills | Status: DC

## 2023-11-01 MED ORDER — ZOLPIDEM TARTRATE 10 MG PO TABS: ORAL_TABLET | ORAL | 1 refills | Status: DC

## 2023-11-01 MED ORDER — AMLODIPINE BESYLATE 5 MG PO TABS: ORAL_TABLET | ORAL | 0 refills | Status: DC

## 2023-11-01 NOTE — Telephone Encounter (Signed)
[facility] requesting refill for zolpidem  Requesting: zolpidem 10mg  Contract: No UDS: 06/13/23 Last Visit: 06/13/23 Next Visit: Visit date not found Last Refill: 08/04/23  Please Advise

## 2023-11-01 NOTE — Telephone Encounter (Signed)
Called [facility] and canceled prescriptions that were sent in earlier

## 2023-11-02 ENCOUNTER — Encounter: Payer: Self-pay | Admitting: Dermatology

## 2023-11-02 ENCOUNTER — Ambulatory Visit: Payer: Medicare HMO | Admitting: Dermatology

## 2023-11-02 VITALS — BP 130/70 | HR 90

## 2023-11-02 DIAGNOSIS — Z85828 Personal history of other malignant neoplasm of skin: Secondary | ICD-10-CM | POA: Diagnosis not present

## 2023-11-02 DIAGNOSIS — D0439 Carcinoma in situ of skin of other parts of face: Secondary | ICD-10-CM

## 2023-11-02 DIAGNOSIS — L570 Actinic keratosis: Secondary | ICD-10-CM

## 2023-11-02 DIAGNOSIS — L905 Scar conditions and fibrosis of skin: Secondary | ICD-10-CM

## 2023-11-02 DIAGNOSIS — D099 Carcinoma in situ, unspecified: Secondary | ICD-10-CM

## 2023-11-02 NOTE — Progress Notes (Signed)
   Follow-Up Visit   Subjective  Joseph Fowler is a 67 y.o. male who presents for the following: wound check s/p Mohs for [facility] on the left lateral neck, repaired with linear closure.   Pt here today for follow up to Mohs left lateral neck treated on 10/05/2023  AK and SCCIS on face have healed well s/p cryo.  The following portions of the chart were reviewed this encounter and updated as appropriate: medications, allergies, medical history  Review of Systems:  No other skin or systemic complaints except as noted in HPI or Assessment and Plan.  Objective  Well appearing patient in no apparent distress; mood and affect are within normal limits.  A focused examination was performed of the following areas: Left lateral neck  Relevant exam findings are noted in the Assessment and Plan.    Assessment & Plan   Wound check s/p Mohs for BCC on left lateral neck Exam: well healing wound - Reassured that wound is healing well - Scar remodel for up to 1 year post procedure - Recommend UV spf30+ to scar to prevent PIH  Squamous cell carcinoma in situ (SCCIS) in AK- stable Left Malar Cheek - s/p 1 round of cryo - will reassess in 2 months  AK (actinic keratosis)-stable Right Nasal Sidewall - s/p 1 round of cryo - will reassess in 2 months  Return in about 2 months (around 01/02/2024) for follow up to AKs.  I, Tillie Fantasia, CMA, am acting as scribe for Gwenith Daily, MD.   Documentation: I have reviewed the above documentation for accuracy and completeness, and I agree with the above.  Gwenith Daily, MD

## 2023-11-02 NOTE — Patient Instructions (Signed)

## 2023-11-19 ENCOUNTER — Encounter: Payer: Self-pay | Admitting: Family Medicine

## 2023-11-20 ENCOUNTER — Ambulatory Visit (HOSPITAL_BASED_OUTPATIENT_CLINIC_OR_DEPARTMENT_OTHER)
Admission: RE | Admit: 2023-11-20 | Discharge: 2023-11-20 | Disposition: A | Discharge status: Home / Self Care | Payer: Medicare HMO | Source: Ambulatory Visit | Attending: Family Medicine | Admitting: Family Medicine

## 2023-11-20 ENCOUNTER — Ambulatory Visit (INDEPENDENT_AMBULATORY_CARE_PROVIDER_SITE_OTHER): Payer: Medicare HMO | Admitting: Family Medicine

## 2023-11-20 VITALS — BP 131/85 | HR 74 | Temp 97.8°F | Ht 74.0 in | Wt 227.0 lb

## 2023-11-20 DIAGNOSIS — R1909 Other intra-abdominal and pelvic swelling, mass and lump: Secondary | ICD-10-CM | POA: Diagnosis not present

## 2023-11-20 DIAGNOSIS — J029 Acute pharyngitis, unspecified: Secondary | ICD-10-CM | POA: Diagnosis not present

## 2023-11-20 DIAGNOSIS — R051 Acute cough: Secondary | ICD-10-CM | POA: Diagnosis not present

## 2023-11-20 DIAGNOSIS — R1031 Right lower quadrant pain: Secondary | ICD-10-CM | POA: Diagnosis not present

## 2023-11-20 LAB — POCT RAPID STREP A (OFFICE): Rapid Strep A Screen: NEGATIVE

## 2023-11-20 MED ORDER — BENZONATATE 200 MG PO CAPS: 200.0000 mg | ORAL_CAPSULE | Freq: Two times a day (BID) | ORAL | 0 refills | Status: AC | PRN

## 2023-11-20 NOTE — Telephone Encounter (Signed)
Pt came in and saw Hyman Hopes today. He is scheduled for an MRI today at 5:15 pm

## 2023-11-20 NOTE — Progress Notes (Signed)
Acute Office Visit  Subjective:     Patient ID: EARNEST REGINA, male    DOB: [birth year], 67 y.o.   MRN: [id number]  Chief Complaint  Patient presents with   Hernia    HPI Patient is in today for right groin pain.   Discussed the use of AI scribe software for clinical note transcription with the patient, who gave verbal consent to proceed.  History of Present Illness   The patient, a 67 year old with a history of triple bypass surgery and prostate cancer, presents with a new onset right groin swelling and discomfort. They first noticed the issue three days ago, which has progressively worsened. The patient describes the pain as a shooting sensation, rating it as a 3 out of 10 at rest, but it intensifies upon palpation. They recall a similar experience in their twenties, which required surgical intervention for a hernia (cannot recall location of previous hernia repair).  In addition to the groin issue, the patient also reports a recurrence of throat discomfort and swollen glands. They were treated for strep throat three weeks ago with a 10-day course of Amoxicillin 500mg  twice daily. They initially felt better during the treatment, but symptoms have returned in the past few days. The patient also mentions a new productive cough with yellow sputum, particularly noticeable at night.             ROS All review of systems negative except what is listed in the HPI      Objective:    BP 131/85   Pulse 74   Temp 97.8 F (36.6 C) (Oral)   Ht 6\' 2"  (1.88 m)   Wt 227 lb (103 kg)   SpO2 98%   BMI 29.15 kg/m    Physical Exam Vitals reviewed. Exam conducted with a chaperone present.  Constitutional:      Appearance: Normal appearance.  HENT:     Mouth/Throat:     Mouth: Mucous membranes are moist.     Pharynx: Oropharynx is clear. Posterior oropharyngeal erythema present. No oropharyngeal exudate.  Abdominal:    Lymphadenopathy:     Cervical: Cervical adenopathy  present.  Skin:    General: Skin is warm and dry.  Neurological:     Mental Status: He is alert and oriented to person, place, and time.  Psychiatric:        Mood and Affect: Mood normal.        Behavior: Behavior normal.        Thought Content: Thought content normal.        Judgment: Judgment normal.     Results for orders placed or performed in visit on 11/20/23  POCT rapid strep A  Result Value Ref Range   Rapid Strep A Screen Negative Negative        Assessment & Plan:   Problem List Items Addressed This Visit   None Visit Diagnoses     Right groin pain    -  Primary   Relevant Orders   US Pelvis Limited   Sore throat       Relevant Orders   POCT rapid strep A (Completed)   Acute cough       Relevant Medications   benzonatate (TESSALON) 200 MG capsule       Groin Pain Sudden onset of right groin pain and visible swelling 3-4 days ago, possibly related to straining during urination. History of previous hernia repair in the same area in his 25s. -Order ultrasound of the  right groin to confirm diagnosis. -Advise patient to avoid strenuous activities and to seek immediate medical attention if pain worsens significantly.  Upper Respiratory Symptoms Recent history of strep throat treated with amoxicillin, now presenting with sore throat, swollen glands, and cough with yellow sputum. -Perform rapid strep test to rule out recurrent strep throat. Negative -Treat symptoms as viral infection with supportive care (salt water gargles, over-the-counter medications, rest, and hydration). -Follow up if symptoms persist or worsen over the next week.          Meds ordered this encounter  Medications   benzonatate (TESSALON) 200 MG capsule    Sig: Take 1 capsule (200 mg total) by mouth 2 (two) times daily as needed for cough.    Dispense:  20 capsule    Refill:  0    Order Specific Question:   Supervising Provider    Answer:   Danise Edge A [[id number]]    Return if  symptoms worsen or fail to improve.  Clayborne Dana, NP

## 2023-11-21 ENCOUNTER — Encounter: Payer: Self-pay | Admitting: Family Medicine

## 2023-11-21 DIAGNOSIS — E278 Other specified disorders of adrenal gland: Secondary | ICD-10-CM

## 2023-11-21 DIAGNOSIS — R599 Enlarged lymph nodes, unspecified: Secondary | ICD-10-CM

## 2023-11-21 DIAGNOSIS — K4090 Unilateral inguinal hernia, without obstruction or gangrene, not specified as recurrent: Secondary | ICD-10-CM

## 2023-11-21 DIAGNOSIS — R1031 Right lower quadrant pain: Secondary | ICD-10-CM

## 2023-11-21 NOTE — Telephone Encounter (Signed)
Pt had ultrasound done yesterday. Result are in

## 2023-11-22 ENCOUNTER — Other Ambulatory Visit (INDEPENDENT_AMBULATORY_CARE_PROVIDER_SITE_OTHER): Payer: Medicare HMO

## 2023-11-22 ENCOUNTER — Ambulatory Visit (HOSPITAL_BASED_OUTPATIENT_CLINIC_OR_DEPARTMENT_OTHER)
Admission: RE | Admit: 2023-11-22 | Discharge: 2023-11-22 | Disposition: A | Discharge status: Home / Self Care | Payer: Medicare HMO | Source: Ambulatory Visit | Attending: Family Medicine | Admitting: Family Medicine

## 2023-11-22 DIAGNOSIS — R599 Enlarged lymph nodes, unspecified: Secondary | ICD-10-CM | POA: Diagnosis not present

## 2023-11-22 DIAGNOSIS — R1031 Right lower quadrant pain: Secondary | ICD-10-CM | POA: Diagnosis not present

## 2023-11-22 DIAGNOSIS — K7689 Other specified diseases of liver: Secondary | ICD-10-CM | POA: Diagnosis not present

## 2023-11-22 DIAGNOSIS — K5730 Diverticulosis of large intestine without perforation or abscess without bleeding: Secondary | ICD-10-CM | POA: Diagnosis not present

## 2023-11-22 DIAGNOSIS — K429 Umbilical hernia without obstruction or gangrene: Secondary | ICD-10-CM | POA: Diagnosis not present

## 2023-11-22 DIAGNOSIS — E278 Other specified disorders of adrenal gland: Secondary | ICD-10-CM | POA: Insufficient documentation

## 2023-11-22 LAB — BASIC METABOLIC PANEL
BUN: 18 mg/dL (ref 6–23)
CO2: 27 meq/L (ref 19–32)
Calcium: 9.3 mg/dL (ref 8.4–10.5)
Chloride: 104 meq/L (ref 96–112)
Creatinine, Ser: 0.92 mg/dL (ref 0.40–1.50)
GFR: 87.2 mL/min (ref >60.00)
Glucose, Bld: 145 mg/dL — ABNORMAL HIGH (ref 70–99)
Potassium: 3.7 meq/L (ref 3.5–5.1)
Sodium: 140 meq/L (ref 135–145)

## 2023-11-22 MED ORDER — IOHEXOL 300 MG/ML  SOLN
100.0000 mL | Freq: Once | INTRAMUSCULAR | Status: AC | PRN
  Administered 2023-11-22: 100 mL via INTRAVENOUS

## 2023-11-22 NOTE — Progress Notes (Signed)
Looks like the area of concern is a fat-containing hernia. I can go ahead and place a referral to general surgery to discuss options for repair if this remains bothersome for you. Any severe symptoms, please go to the Emergency Department.   Lymph nodes look fine.   Incidentally, there is a 1.5 cm adrenal mass, which is likely benign, but suggested that we look at this again in a year to ensure stable. I will forward to PCP Dr. Abner Greenspan so she can keep this on your chart to repeat next year.   Aortic and coronary artery atherosclerosis (stiffening, plaque-buildup) - you are already following with cardiology and taking cholesterol medication and aspirin. Keeping glucose and blood pressure under control and following heart healthy lifestyle is very important. Keep regular follow-up with cardiology.

## 2023-11-22 NOTE — Addendum Note (Signed)
Addended by: Hyman Hopes B on: 11/22/2023 11:01 PM   Modules accepted: Orders

## 2023-11-22 NOTE — Addendum Note (Signed)
Addended by: Mervin Kung A on: 11/22/2023 02:53 PM   Modules accepted: Orders

## 2023-11-24 ENCOUNTER — Encounter: Payer: Self-pay | Admitting: Family Medicine

## 2023-11-24 DIAGNOSIS — K4090 Unilateral inguinal hernia, without obstruction or gangrene, not specified as recurrent: Secondary | ICD-10-CM

## 2023-11-27 LAB — POCT I-STAT CREATININE: Creatinine, Ser: 1 mg/dL (ref 0.61–1.24)

## 2023-11-27 NOTE — Telephone Encounter (Signed)
Surgery referral pended

## 2023-12-07 ENCOUNTER — Telehealth: Payer: Self-pay

## 2023-12-07 DIAGNOSIS — K4090 Unilateral inguinal hernia, without obstruction or gangrene, not specified as recurrent: Secondary | ICD-10-CM | POA: Diagnosis not present

## 2023-12-07 DIAGNOSIS — E1159 Type 2 diabetes mellitus with other circulatory complications: Secondary | ICD-10-CM | POA: Diagnosis not present

## 2023-12-07 DIAGNOSIS — Z9079 Acquired absence of other genital organ(s): Secondary | ICD-10-CM | POA: Diagnosis not present

## 2023-12-07 DIAGNOSIS — I25810 Atherosclerosis of coronary artery bypass graft(s) without angina pectoris: Secondary | ICD-10-CM | POA: Diagnosis not present

## 2023-12-07 NOTE — Telephone Encounter (Signed)
   Pre-operative Risk Assessment    Patient Name: Joseph Fowler  DOB: 16-May-1956 MRN: [id number]   Last ov:11/28/22 Dr. Flora Lipps  Upcoming ov: Unknown      Request for Surgical Clearance3    Procedure:   Inguinal hernia surgery   Date of Surgery:  Clearance TBD                                 Surgeon:  Feliciana Rossetti, MD Surgeon's Group or Practice Name:  [facility]  Phone number:  [phone] Fax number:  [phone]   Type of Clearance Requested:   - Medical  - Pharmacy:  Hold Aspirin     Type of Anesthesia:  General    Additional requests/questions:    Vance Peper   12/07/2023, 5:21 PM

## 2023-12-08 NOTE — Telephone Encounter (Signed)
Patient returned Pre-op call and scheduled in-office visit on 01/02/24.

## 2023-12-08 NOTE — Telephone Encounter (Signed)
Lvm for pt to call office to schedule in ov appt for preop.

## 2023-12-08 NOTE — Telephone Encounter (Signed)
   Name: Joseph Fowler  DOB: 23-May-1956  MRN: [id number]  Primary Cardiologist: Reatha Harps, MD  Chart reviewed as part of pre-operative protocol coverage. Because of [name] past medical history and time since last visit, he will require a follow-up in-office visit in order to better assess preoperative cardiovascular risk. Last seen by Dr. Flora Lipps 11/28/2022  Pre-op covering staff: - Please schedule appointment and call patient to inform them. If patient already had an upcoming appointment within acceptable timeframe, please add "pre-op clearance" to the appointment notes so provider is aware. - Please contact requesting surgeon's office via preferred method (i.e, phone, fax) to inform them of need for appointment prior to surgery.  Per office protocol, if patient is without any new symptoms or concerns at the time of their virtual visit, he/she may hold ASA for 7 days prior to procedure. Please resume ASA as soon as possible postprocedure, at the discretion of the surgeon.    Joni Reining, NP  12/08/2023, 7:57 AM

## 2023-12-11 NOTE — Telephone Encounter (Signed)
Pt has appt 01/02/24 with Reather Littler, NP for preop clearance.

## 2023-12-15 DIAGNOSIS — C61 Malignant neoplasm of prostate: Secondary | ICD-10-CM | POA: Diagnosis not present

## 2023-12-15 DIAGNOSIS — N393 Stress incontinence (female) (male): Secondary | ICD-10-CM | POA: Diagnosis not present

## 2023-12-15 DIAGNOSIS — N5201 Erectile dysfunction due to arterial insufficiency: Secondary | ICD-10-CM | POA: Diagnosis not present

## 2023-12-31 NOTE — Progress Notes (Signed)
 Cardiology Office Note    Date:  01/02/2024  ID:  Joseph Fowler, DOB [birth year], MRN [id number] PCP:  Domenica Harlene LABOR, MD  Cardiologist:  Darryle ONEIDA Decent, MD  Electrophysiologist:  None   Chief Complaint: Follow up for CAD and preoperative cardiac evaluation   History of Present Illness: .    Joseph Fowler is a 68 y.o. male with visit-pertinent history of hypertension, diabetes mellitus type 2, RBBB and CAD s/p CABG x 3 at [facility] on 07/22/2022.  First evaluated by Dr. Decent on 06/03/2022 for evaluation of chest pain.  Following an abnormal coronary CTA he underwent coronary angiography that revealed severe diffuse multivessel CAD with heavily calcified coronary arteries.  With his history of diabetes he was referred to CT surgery for evaluation for CABG revascularization.  He opted to have surgery completed at [facility] in 06/2022.  He was last seen in clinic by Dr. Decent on 11/28/22, he had remained stable from a cardiac standpoint.   Today he presents for follow-up and preoperative cardiac evaluation.  He reports that he has been doing very well. He has been going to the gym 4-5 days a week and exercises at least an hour. He also walks his Huskies 3-4 times a day.  He denies any chest pain or shortness of breath, palpitations, lower extreme edema, orthopnea or PND.  He is planning to undergo inguinal hernia surgery soon.  Labwork independently reviewed: 11/22/2023: Sodium 140, potassium 3.7, creatinine 0.92 ROS: .   Today he denies chest pain, shortness of breath, lower extremity edema, fatigue, palpitations, melena, hematuria, hemoptysis, diaphoresis, weakness, presyncope, syncope, orthopnea, and PND.  All other systems are reviewed and otherwise negative. Studies Reviewed: SABRA    EKG:  EKG is ordered today, personally reviewed, demonstrating  EKG Interpretation Date/Time:  Tuesday January 02 2024 09:20:28 EST Ventricular Rate:  69 PR Interval:  182 QRS Duration:  140 QT  Interval:  426 QTC Calculation: 456 R Axis:   9  Text Interpretation: Normal sinus rhythm Right bundle branch block No significant change since last tracing Confirmed by Shailyn Weyandt [phone]) on 01/02/2024 9:28:20 AM   CV Studies:  Cardiac Studies & Procedures   CARDIAC CATHETERIZATION  CARDIAC CATHETERIZATION 07/07/2022  Narrative   Prox RCA lesion is 80% stenosed.   Mid RCA lesion is 80% stenosed.   Ost RCA to Prox RCA lesion is 20% stenosed.   Dist RCA lesion is 20% stenosed.   Prox Cx to Mid Cx lesion is 60% stenosed.   Mid Cx lesion is 95% stenosed.   2nd Diag lesion is 60% stenosed.   Mid LAD-1 lesion is 60% stenosed.   Mid LAD-2 lesion is 99% stenosed.  Diffuse moderate heavily calcified mid LAD stenosis. Focal severe/sub-total LAD occlusion. Severe stenosis proximal segment of Diagonal 2. The Circumflex has a moderately severe mid stenosis and a severe distal stenosis The RCA is a large, dominant vessel. The entire vessel is heavily calcified. The mid vessel has two severe lesions, both involving bulky calcification. Normal LVEDP  Recommendations: Severe, diffuse multi-vessel CAD with heavily calcified coronary arteries in a diabetic patient. I think the best strategy for revascularization is CABG. I will make an outpatient referral to CT surgery to discuss bypass. He has normal LV function by echo. Continue ASA, statin and beta blocker.  Findings Coronary Findings Diagnostic  Dominance: Right  Left Anterior Descending Vessel is large. Mid LAD-1 lesion is 60% stenosed. Mid LAD-2 lesion is 99% stenosed. The lesion  is calcified.  First Diagonal Branch  Second Diagonal Branch 2nd Diag lesion is 60% stenosed.  Left Circumflex Vessel is large. Prox Cx to Mid Cx lesion is 60% stenosed. Mid Cx lesion is 95% stenosed.  Right Coronary Artery Vessel is large. Ost RCA to Prox RCA lesion is 20% stenosed. The lesion is calcified. Prox RCA lesion is 80% stenosed. The lesion is  calcified. Mid RCA lesion is 80% stenosed. The lesion is calcified. Dist RCA lesion is 20% stenosed. The lesion is calcified.  Intervention  No interventions have been documented.    ECHOCARDIOGRAM  ECHOCARDIOGRAM COMPLETE 06/17/2022  Narrative ECHOCARDIOGRAM REPORT    Patient Name:   Joseph Fowler Date of Exam: 06/17/2022 Medical Rec #:  [id number]      Height:       74.0 in Accession #:    [id number]     Weight:       244.4 lb Date of Birth:  Sep 07, 1956       BSA:          2.367 m Patient Age:    66 years       BP:           176/96 mmHg Patient Gender: M              HR:           67 bpm. Exam Location:  [address]  Procedure: 2D Echo, 3D Echo, Cardiac Doppler and Color Doppler  Indications:    R07.9 Chest pain  History:        Patient has no prior history of Echocardiogram examinations. Signs/Symptoms:Shortness of Breath; Risk Factors:Hypertension, Dyslipidemia, Diabetes, Family History of Coronary Artery Disease and Former Smoker. Obesity.  Sonographer:    Heather Hawks RDCS Referring Phys: DARRYLE NED O'NEAL  IMPRESSIONS   1. Left ventricular ejection fraction, by estimation, is 60 to 65%. The left ventricle has normal function. The left ventricle has no regional wall motion abnormalities. There is mild asymmetric left ventricular hypertrophy of the basal-septal segment. Left ventricular diastolic parameters are consistent with Grade I diastolic dysfunction (impaired relaxation). 2. Right ventricular systolic function is normal. The right ventricular size is normal. There is normal pulmonary artery systolic pressure. 3. Left atrial size was moderately dilated. 4. Right atrial size was mildly dilated. 5. The mitral valve is degenerative. No evidence of mitral valve regurgitation. No evidence of mitral stenosis. 6. The aortic valve is tricuspid. There is mild calcification of the aortic valve. Aortic valve regurgitation is not visualized. Aortic valve  sclerosis/calcification is present, without any evidence of aortic stenosis. 7. Aortic dilatation noted. There is mild dilatation of the ascending aorta, measuring 41 mm. 8. The inferior vena cava is normal in size with greater than 50% respiratory variability, suggesting right atrial pressure of 3 mmHg.  FINDINGS Left Ventricle: Left ventricular ejection fraction, by estimation, is 60 to 65%. The left ventricle has normal function. The left ventricle has no regional wall motion abnormalities. The left ventricular internal cavity size was normal in size. There is mild asymmetric left ventricular hypertrophy of the basal-septal segment. Left ventricular diastolic parameters are consistent with Grade I diastolic dysfunction (impaired relaxation).  Right Ventricle: The right ventricular size is normal. No increase in right ventricular wall thickness. Right ventricular systolic function is normal. There is normal pulmonary artery systolic pressure. The tricuspid regurgitant velocity is 2.35 m/s, and with an assumed right atrial pressure of 3 mmHg, the estimated right ventricular systolic pressure is 25.1  mmHg.  Left Atrium: Left atrial size was moderately dilated.  Right Atrium: Right atrial size was mildly dilated.  Pericardium: There is no evidence of pericardial effusion.  Mitral Valve: The mitral valve is degenerative in appearance. There is mild calcification of the mitral valve leaflet(s). No evidence of mitral valve regurgitation. No evidence of mitral valve stenosis.  Tricuspid Valve: The tricuspid valve is normal in structure. Tricuspid valve regurgitation is mild . No evidence of tricuspid stenosis.  Aortic Valve: The aortic valve is tricuspid. There is mild calcification of the aortic valve. Aortic valve regurgitation is not visualized. Aortic valve sclerosis/calcification is present, without any evidence of aortic stenosis.  Pulmonic Valve: The pulmonic valve was normal in structure.  Pulmonic valve regurgitation is trivial. No evidence of pulmonic stenosis.  Aorta: The aortic root is normal in size and structure and aortic dilatation noted. Ascending aorta measurements are within normal limits for age when indexed to body surface area. There is mild dilatation of the ascending aorta, measuring 41 mm.  Venous: The inferior vena cava is normal in size with greater than 50% respiratory variability, suggesting right atrial pressure of 3 mmHg.  IAS/Shunts: No atrial level shunt detected by color flow Doppler.   LEFT VENTRICLE PLAX 2D LVIDd:         4.20 cm   Diastology LVIDs:         2.70 cm   LV e' medial:    6.64 cm/s LV PW:         1.00 cm   LV E/e' medial:  8.1 LV IVS:        1.20 cm   LV e' lateral:   11.30 cm/s LVOT diam:     2.40 cm   LV E/e' lateral: 4.7 LV SV:         103 LV SV Index:   43 LVOT Area:     4.52 cm  3D Volume EF: 3D EF:        64 % LV EDV:       174 ml LV ESV:       62 ml LV SV:        112 ml  RIGHT VENTRICLE RV Basal diam:  4.60 cm RV Mid diam:    3.20 cm RV S prime:     15.30 cm/s TAPSE (M-mode): 2.4 cm  LEFT ATRIUM              Index        RIGHT ATRIUM           Index LA diam:        4.70 cm  1.99 cm/m   RA Area:     24.60 cm LA Vol (A2C):   93.3 ml  39.42 ml/m  RA Volume:   75.70 ml  31.98 ml/m LA Vol (A4C):   109.0 ml 46.05 ml/m LA Biplane Vol: 103.0 ml 43.52 ml/m AORTIC VALVE LVOT Vmax:   115.67 cm/s LVOT Vmean:  73.900 cm/s LVOT VTI:    0.227 m  AORTA Ao Root diam: 4.50 cm Ao Asc diam:  4.10 cm  MITRAL VALVE               TRICUSPID VALVE MV Area (PHT)  cm         TR Peak grad:   22.1 mmHg MV Decel Time: 240 msec    TR Vmax:        235.00 cm/s MV E velocity: 53.50 cm/s MV A velocity:  69.95 cm/s  SHUNTS MV E/A ratio:  0.76        Systemic VTI:  0.23 m Systemic Diam: 2.40 cm  Toribio Fuel MD Electronically signed by Toribio Fuel MD Signature Date/Time: 06/17/2022/6:43:48 PM    Final    CT  SCANS  CT CORONARY FRACTIONAL FLOW RESERVE DATA PREP 06/10/2022  Narrative EXAM: CT FFR ANALYSIS  CLINICAL DATA:  abnormal findings of cardiovascular imaging  FINDINGS: FFRct analysis was performed on the original cardiac CT angiogram dataset. Diagrammatic representation of the FFRct analysis is provided in a separate PDF document in PACS. This dictation was created using the PDF document and an interactive 3D model of the results. 3D model is not available in the EMR/PACS. Normal FFR range is >0.80.  1. Left Main:  No significant stenosis. FFR = 1.00  2. LAD: Significant stenosis in the mid LAD. Proximal FFR = 0.98, Mid FFR = 0.68, Distal FFR = 0.61 3. LCX: No significant stenosis. Proximal FFR = 0.99, Distal FFR = 0.83 4. RCA: No significant stenosis. Proximal FFR = 0.98, Mid FFR = 0.94, Distal FFR = 0.87  IMPRESSION: 1. CT FFR analysis shows significant stenosis in the mid LAD (FFR 0.68).   Electronically Signed By: Shelda Bruckner M.D. On: 06/10/2022 08:24   CT SCANS  CT CORONARY MORPH W/CTA COR W/SCORE 06/08/2022  Addendum 06/09/2022  8:35 PM ADDENDUM REPORT: 06/09/2022 20:32  HISTORY: Chest pain/anginal equiv, intermediate CAD risk, not treadmill candidate  EXAM: Cardiac/Coronary CT  TECHNIQUE: The patient was scanned on a [employer].  PROTOCOL: A 100 kV prospective scan was triggered in the descending thoracic aorta at 111 HU's. Axial non-contrast 3 mm slices were carried out through the heart. The data set was analyzed on a dedicated work station and scored using the Agatston method. Gantry rotation speed was 250 msecs and collimation was .6 mm. Heart rate was optimized medically and sl NTG was given. The 3D data set was reconstructed in 5% intervals of the 35-75 % of the R-R cycle. Systolic and diastolic phases were analyzed on a dedicated work station using MPR, MIP and VRT modes. The patient received OMNIPAQUE  IOHEXOL  350  MG/ML SOLN of contrast.  FINDINGS: Coronary calcium  score: The patient's coronary artery calcium  score is 7238, which places the patient in the 99th percentile.  Coronary arteries: Normal coronary origins.  Right dominance.  Right Coronary Artery: Normal caliber vessel, gives rise to PDA. Diffuse calcified plaque throughout. Concern for 70-99% lesion in proximal RCA. Heavy calcification focally and at least 50-69% stenosis in both mid and distal RCA.  Left Main Coronary Artery: Normal caliber vessel. Noncalcified plaque noted with 25-49% stenosis distally.  Left Anterior Descending Coronary Artery: Normal caliber vessel. Diffuse calcified plaque throughout. Maximum stenosis of 70-99% in mid LAD. Gives rise to branching first and regular second diagonal branches, with significant stenosis in both branches (each with >50% stenosis).  Left Circumflex Artery: Normal caliber vessel. Diffuse calcified plaque throughout. Maximum stenosis 70-99% in distal LCx. Gives rise to 2 small OM branches.  Aorta: Normal size, 37.7 mm at the mid ascending aorta (level of the PA bifurcation) measured double oblique. Diffuse aortic atherosclerosis. No dissection seen in visualized portions of the aorta.  Aortic Valve: No calcifications. Trileaflet.  Other findings:  Normal pulmonary vein drainage into the left atrium.  Normal left atrial appendage without a thrombus.  Normal size of the pulmonary artery.  Normal appearance of the pericardium.  IMPRESSION: 1. Severe calcified 3 vessel  CAD, CADRADS=4. CT FFR will be performed and reported separately.  2. Coronary calcium  score of 7238. This was 99th percentile for age and sex matched control.  3. Normal coronary origin with right dominance.  INTERPRETATION:  1. CAD-RADS 0: No evidence of CAD (0%). Consider non-atherosclerotic causes of chest pain.  2. CAD-RADS 1: Minimal non-obstructive CAD (0-24%). Consider non-atherosclerotic  causes of chest pain. Consider preventive therapy and risk factor modification.  3. CAD-RADS 2: Mild non-obstructive CAD (25-49%). Consider non-atherosclerotic causes of chest pain. Consider preventive therapy and risk factor modification.  4. CAD-RADS 3: Moderate stenosis (50-69%). Consider symptom-guided anti-ischemic pharmacotherapy as well as risk factor modification per guideline directed care. Additional analysis with CT FFR will be submitted.  5. CAD-RADS 4: Severe stenosis. (70-99% or > 50% left main). Cardiac catheterization or CT FFR is recommended. Consider symptom-guided anti-ischemic pharmacotherapy as well as risk factor modification per guideline directed care. Invasive coronary angiography recommended with revascularization per published guideline statements.  6. CAD-RADS 5: Total coronary occlusion (100%). Consider cardiac catheterization or viability assessment. Consider symptom-guided anti-ischemic pharmacotherapy as well as risk factor modification per guideline directed care.  7. CAD-RADS N: Non-diagnostic study. Obstructive CAD can't be excluded. Alternative evaluation is recommended.   Electronically Signed By: Shelda Bruckner M.D. On: 06/09/2022 20:32  Narrative EXAM: OVER-READ INTERPRETATION  CT CHEST  The following report is a limited chest CT over-read performed by radiologist Dr. Jackquline Boxer of [facility] on 06/08/2022. The CTA interpretation by the cardiologist is attached.  COMPARISON:  None Available.  FINDINGS: Limited view of the lung parenchyma demonstrates no suspicious nodularity. Airways are normal.  Limited view of the mediastinum demonstrates no adenopathy. Esophagus normal.  Limited view of the upper abdomen unremarkable.  Limited view of the skeleton and chest wall is unremarkable.  IMPRESSION: No significant extracardiac findings.  Electronically Signed: By: Jackquline Boxer M.D. On: 06/08/2022  16:03          Current Reported Medications:.    Current Meds  Medication Sig   amLODipine  (NORVASC ) 5 MG tablet TAKE 1 TABLET BY MOUTH IN THE MORNING AND AT BEDTIME   Ascorbic Acid (VITAMIN C) 1000 MG tablet Take 1,000 mg by mouth daily.   aspirin  EC 81 MG tablet Take 1 tablet (81 mg total) by mouth daily. Swallow whole.   atorvastatin  (LIPITOR) 40 MG tablet Take 1 tablet (40 mg total) by mouth at bedtime.   blood glucose meter kit and supplies Dispense based on patient and insurance preference. Use up to four times daily as directed. (FOR ICD-10 E10.9, E11.9).   Blood Glucose Monitoring Suppl (ONETOUCH VERIO) w/Device KIT Use up to four times daily as directed.   Calcium  Polycarbophil (FIBER-CAPS PO) Take 2 capsules by mouth daily.   Cholecalciferol (VITAMIN D) 50 MCG (2000 UT) tablet Take 2,000 Units by mouth daily.   fluticasone  (FLONASE ) 50 MCG/ACT nasal spray Place 2 sprays into both nostrils daily. (Patient taking differently: Place 2 sprays into both nostrils at bedtime as needed for allergies.)   glucose blood (ONETOUCH VERIO) test strip Use as instructed   loratadine  (CLARITIN ) 10 MG tablet Take 1 tablet (10 mg total) by mouth daily.   magnesium  oxide (MAG-OX) 400 (240 Mg) MG tablet Take 400 mg by mouth at bedtime.   metFORMIN  (GLUCOPHAGE ) 500 MG tablet Take 2 tablets (1,000 mg total) by mouth 2 (two) times daily with a meal.   metoprolol  tartrate (LOPRESSOR ) 25 MG tablet Take 1.5 tablets (37.5 mg total) by  mouth 2 (two) times daily.   Multiple Vitamins-Minerals (MULTIVITAMIN WITH MINERALS) tablet Take 1 tablet by mouth daily.   nitroGLYCERIN  (NITROSTAT ) 0.4 MG SL tablet Place 1 tablet (0.4 mg total) under the tongue every 5 (five) minutes as needed for chest pain.   OneTouch Delica Lancets 33G MISC Use as instructed   sildenafil  (VIAGRA ) 100 MG tablet TAKE 1 TABLET BY MOUTH ONCE DAILY AS NEEDED FOR ERECTILE DYSFUNCTION   zolpidem  (AMBIEN ) 10 MG tablet TAKE 1/2 TO 1 (ONE-HALF TO  ONE) TABLET BY MOUTH AT BEDTIME AS NEEDED FOR SLEEP   Physical Exam:    VS:  BP 124/88   Pulse 69   Ht 6' 2 (1.88 m)   Wt 227 lb 3.2 oz (103.1 kg)   SpO2 97%   BMI 29.17 kg/m    Wt Readings from Last 3 Encounters:  01/02/24 227 lb 3.2 oz (103.1 kg)  11/20/23 227 lb (103 kg)  06/13/23 225 lb 12.8 oz (102.4 kg)    GEN: Well nourished, well developed in no acute distress NECK: No JVD; No carotid bruits CARDIAC: RRR, no murmurs, rubs, gallops RESPIRATORY:  Clear to auscultation without rales, wheezing or rhonchi  ABDOMEN: Soft, non-tender, non-distended EXTREMITIES:  No edema; No acute deformity   Asessement and Plan:.    Preoperative cardiac evaluation: Patient to have inguinal hernia surgery with Dr. Herlene Bureau, date TBD. Mr. Senat perioperative risk of a major cardiac event is 0.9% according to the Revised Cardiac Risk Index (RCRI).  Therefore, he is at low risk for perioperative complications.   His functional capacity is excellent at 9.89 METs according to the Duke Activity Status Index (DASI). Recommendations:According to ACC/AHA guidelines, no further cardiovascular testing needed.  The patient may proceed to surgery at acceptable risk.  Antiplatelet and/or Anticoagulation Recommendations: Aspirin  can be held for 5-7 days prior to his surgery.  Please resume Aspirin  post operatively when it is felt to be safe from a bleeding standpoint.   CAD: s/p CABG x[facility] 07/22/22. Stable with no anginal symptoms. No indication for ischemic evaluation.  He is able to exceed 4 METS of activity without any anginal symptoms.  Heart healthy diet and regular cardiovascular exercise encouraged. Reviewed ED precautions. Continue amlodipine  5 mg daily, aspirin  81 mg daily, Lipitor 40 mg daily, metoprolol  tartrate 37.5 mg twice daily.  Hyperlipidemia: Last lipid profile on 06/13/2023 indicated total cholesterol 110, HDL 37.9, triglycerides 121 and LDL 48. Continue atorvastatin  40 mg daily,    Hypertension: Blood pressure today 124/88. Continue current antihypertensive regimen    Disposition: F/u with Dr. Barbaraann or Alyanna Stoermer, NP in one year or sooner if needed.   Signed, Kasean Denherder D Iaan Oregel, NP

## 2024-01-02 ENCOUNTER — Encounter: Payer: Self-pay | Admitting: Cardiology

## 2024-01-02 ENCOUNTER — Ambulatory Visit: Payer: Medicare HMO | Attending: Cardiology | Admitting: Cardiology

## 2024-01-02 VITALS — BP 124/88 | HR 69 | Ht 74.0 in | Wt 227.2 lb

## 2024-01-02 DIAGNOSIS — I25810 Atherosclerosis of coronary artery bypass graft(s) without angina pectoris: Secondary | ICD-10-CM

## 2024-01-02 DIAGNOSIS — I10 Essential (primary) hypertension: Secondary | ICD-10-CM

## 2024-01-02 DIAGNOSIS — E782 Mixed hyperlipidemia: Secondary | ICD-10-CM | POA: Diagnosis not present

## 2024-01-02 DIAGNOSIS — Z01810 Encounter for preprocedural cardiovascular examination: Secondary | ICD-10-CM

## 2024-01-02 NOTE — Patient Instructions (Signed)
 Medication Instructions:  No changes *If you need a refill on your cardiac medications before your next appointment, please call your pharmacy*  Lab Work: No labs  Testing/Procedures: No testing  Follow-Up: At [facility], you and your health needs are our priority.  As part of our continuing mission to provide you with exceptional heart care, we have created designated Provider Care Teams.  These Care Teams include your primary Cardiologist (physician) and Advanced Practice Providers (APPs -  Physician Assistants and Nurse Practitioners) who all work together to provide you with the care you need, when you need it.  We recommend signing up for the patient portal called MyChart.  Sign up information is provided on this After Visit Summary.  MyChart is used to connect with patients for Virtual Visits (Telemedicine).  Patients are able to view lab/test results, encounter notes, upcoming appointments, etc.  Non-urgent messages can be sent to your provider as well.   To learn more about what you can do with MyChart, go to [email or web address].    Your next appointment:   1 year(s)  Provider:   Darryle ONEIDA Decent, MD

## 2024-01-04 ENCOUNTER — Encounter: Payer: Self-pay | Admitting: Dermatology

## 2024-01-04 ENCOUNTER — Ambulatory Visit: Payer: Medicare HMO | Admitting: Dermatology

## 2024-01-04 VITALS — BP 130/86 | HR 78

## 2024-01-04 DIAGNOSIS — D0439 Carcinoma in situ of skin of other parts of face: Secondary | ICD-10-CM | POA: Diagnosis not present

## 2024-01-04 DIAGNOSIS — L570 Actinic keratosis: Secondary | ICD-10-CM | POA: Diagnosis not present

## 2024-01-04 DIAGNOSIS — Z85828 Personal history of other malignant neoplasm of skin: Secondary | ICD-10-CM

## 2024-01-04 DIAGNOSIS — L905 Scar conditions and fibrosis of skin: Secondary | ICD-10-CM

## 2024-01-04 DIAGNOSIS — D099 Carcinoma in situ, unspecified: Secondary | ICD-10-CM

## 2024-01-04 NOTE — Progress Notes (Signed)
   Follow-Up Visit   Subjective  Joseph Fowler is a 68 y.o. male who presents for the following: SCCIS on left malar cheek Pt here to reevaluate SCCIS within AK on left malar cheek and right nasal sidewall which were treated with LN2. He is not worried about anything today. He is also s/p Mohs on the left lateral neck, treated on 10/05/2023.   The following portions of the chart were reviewed this encounter and updated as appropriate: medications, allergies, medical history  Review of Systems:  No other skin or systemic complaints except as noted in HPI or Assessment and Plan.  Objective  Well appearing patient in no apparent distress; mood and affect are within normal limits.  A focused examination was performed of the following areas: Left malar cheek  Relevant exam findings are noted in the Assessment and Plan.    Assessment & Plan   SCCIS within AK and left cheek and AK on right nasal sideweall- Wound check Exam: well healed wounds s/p LN2 treatment - Continue to monitor  Scar s/p Mohs for [facility] on the left lateral neck, treated on 10/05/2023, repaired with linear closure - Reassured that wound has healed well - Discussed that scars take up to 12 months to mature from the date of surgery - Recommend SPF 30+ to scar daily to prevent purple color - OK to start scar massage at 4-6 weeks post-op - Can consider silicone based products for scar healing   Return in about 6 months (around 07/03/2024) for face and neck check.  I, Darice Smock, CMA, am acting as scribe for RUFUS CHRISTELLA HOLY, MD.   Documentation: I have reviewed the above documentation for accuracy and completeness, and I agree with the above.  RUFUS CHRISTELLA HOLY, MD

## 2024-01-04 NOTE — Patient Instructions (Signed)

## 2024-01-15 ENCOUNTER — Other Ambulatory Visit: Payer: Self-pay | Admitting: Cardiovascular Disease

## 2024-01-15 ENCOUNTER — Other Ambulatory Visit: Payer: Self-pay | Admitting: Family Medicine

## 2024-02-01 ENCOUNTER — Ambulatory Visit: Payer: Self-pay | Admitting: General Surgery

## 2024-02-09 NOTE — Progress Notes (Addendum)
COVID Vaccine received:  []  No [x]  Yes Date of any COVID positive Test in last 90 days: no PCP - Danise Edge MD Cardiologist - Lennie Odor MD  Chest x-ray -  EKG -  01/02/24 Epic Stress Test -  ECHO - 06/17/22 Epic Cardiac Cath - 07/07/22 Epic  Bowel Prep - [x]  No  []   Yes ______  Pacemaker / ICD device [x]  No []  Yes   Spinal Cord Stimulator:[x]  No []  Yes       History of Sleep Apnea? [x]  No [x]  Yes   CPAP used?- [x]  No []  Yes    Does the patient monitor blood sugar?          []  No [x]  Yes  []  N/A  Patient has: []  NO Hx DM   []  Pre-DM                 []  DM1  [x]   DM2 Does patient have a [employer] or Dexacom? [x]  No []  Yes   Fasting Blood Sugar Ranges- 128-142 Checks Blood Sugar ___1__ times a month  GLP1 agonist / usual dose - no GLP1 instructions:  SGLT-2 inhibitors / usual dose - no SGLT-2 instructions:   Blood Thinner / Instructions:no Aspirin Instructions:ASA 81 mg- Stop taking 7 days prior to surgery.  Comments:   Activity level: Patient is able  to climb a flight of stairs without difficulty; [x]  No CP  [x]  No SOB, b   Patient can perform ADLs without assistance.   Anesthesia review: CAD,HTN,DM,RBBB,CABG 2023  Patient denies shortness of breath, fever, cough and chest pain at PAT appointment.  Patient verbalized understanding and agreement to the Pre-Surgical Instructions that were given to them at this PAT appointment. Patient was also educated of the need to review these PAT instructions again prior to his/her surgery.I reviewed the appropriate phone numbers to call if they have any and questions or concerns.

## 2024-02-09 NOTE — Patient Instructions (Signed)
SURGICAL WAITING ROOM VISITATION  Patients having surgery or a procedure may have no more than 2 support people in the waiting area - these visitors may rotate.    Children under the age of 67 must have an adult with them who is not the patient.  Due to an increase in RSV and influenza rates and associated hospitalizations, children ages 21 and under may not visit patients in [facility] hospitals.  Visitors with respiratory illnesses are discouraged from visiting and should remain at home.  If the patient needs to stay at the hospital during part of their recovery, the visitor guidelines for inpatient rooms apply. Pre-op nurse will coordinate an appropriate time for 1 support person to accompany patient in pre-op.  This support person may not rotate.    Please refer to the [facility] website for the visitor guidelines for Inpatients (after your surgery is over and you are in a regular room).       Your procedure is scheduled on: 03/01/24   Report to [facility] Main Entrance    Report to admitting at  5:15AM   Call this number if you have problems the morning of surgery [phone]   Do not eat food :After Midnight.   After Midnight you may have the following liquids until 4:30 AM  DAY OF SURGERY  Water Non-Citrus Juices (without pulp, NO RED-Apple, White grape, White cranberry) Black Coffee (NO MILK/CREAM OR CREAMERS, sugar ok)  Clear Tea (NO MILK/CREAM OR CREAMERS, sugar ok) regular and decaf                             Plain Jell-O (NO RED)                                           Fruit ices (not with fruit pulp, NO RED)                                     Popsicles (NO RED)                                                               Sports drinks like Gatorade (NO RED)                  Oral Hygiene is also important to reduce your risk of infection.                                    Remember - BRUSH YOUR TEETH THE MORNING OF SURGERY WITH YOUR REGULAR  TOOTHPASTE  DENTURES WILL BE REMOVED PRIOR TO SURGERY PLEASE DO NOT APPLY "Poly grip" OR ADHESIVES!!!   Stop all vitamins and herbal supplements 7 days before surgery.   Take these medicines the morning of surgery with A SIP OF WATER: Amlodipine, Atorvastatin, Claritin, Metoprolol.  DO NOT TAKE ANY ORAL DIABETIC MEDICATIONS DAY OF YOUR SURGERY Hold Metformin the morning of surgery.  You may not have any metal on your body including hair pins, jewelry, and body piercing             Do not wear make-up, lotions, powders, perfumes/cologne, or deodorant              Men may shave face and neck.   Do not bring valuables to the hospital. [facility] IS NOT             RESPONSIBLE   FOR VALUABLES.   Contacts, glasses, dentures or bridgework may not be worn into surgery.  DO NOT BRING YOUR HOME MEDICATIONS TO THE HOSPITAL. PHARMACY WILL DISPENSE MEDICATIONS LISTED ON YOUR MEDICATION LIST TO YOU DURING YOUR ADMISSION IN THE HOSPITAL!    Patients discharged on the day of surgery will not be allowed to drive home.  Someone NEEDS to stay with you for the first 24 hours after anesthesia.   Special Instructions: Bring a copy of your healthcare power of attorney and living will documents the day of surgery if you haven't scanned them before.              Please read over the following fact sheets you were given: IF YOU HAVE QUESTIONS ABOUT YOUR PRE-OP INSTRUCTIONS PLEASE CALL [phone] Rosey Bath  If you received a COVID test during your pre-op visit  it is requested that you wear a mask when out in public, stay away from anyone that may not be feeling well and notify your surgeon if you develop symptoms. If you test positive for Covid or have been in contact with anyone that has tested positive in the last 10 days please notify you surgeon.    [facility] - Preparing for Surgery Before surgery, you can play an important role.  Because skin is not sterile, your skin needs to be as free  of germs as possible.  You can reduce the number of germs on your skin by washing with CHG (chlorahexidine gluconate) soap before surgery.  CHG is an antiseptic cleaner which kills germs and bonds with the skin to continue killing germs even after washing. Please DO NOT use if you have an allergy to CHG or antibacterial soaps.  If your skin becomes reddened/irritated stop using the CHG and inform your nurse when you arrive at Short Stay. Do not shave (including legs and underarms) for at least 48 hours prior to the first CHG shower.  You may shave your face/neck.  Please follow these instructions carefully:  1.  Shower with CHG Soap the night before surgery and the  morning of surgery.  2.  If you choose to wash your hair, wash your hair first as usual with your normal  shampoo.  3.  After you shampoo, rinse your hair and body thoroughly to remove the shampoo.                             4.  Use CHG as you would any other liquid soap.  You can apply chg directly to the skin and wash.  Gently with a scrungie or clean washcloth.  5.  Apply the CHG Soap to your body ONLY FROM THE NECK DOWN.   Do   not use on face/ open                           Wound or open sores. Avoid contact with eyes, ears mouth  and   genitals (private parts).                       Wash face,  Genitals (private parts) with your normal soap.             6.  Wash thoroughly, paying special attention to the area where your    surgery  will be performed.  7.  Thoroughly rinse your body with warm water from the neck down.  8.  DO NOT shower/wash with your normal soap after using and rinsing off the CHG Soap.                9.  Pat yourself dry with a clean towel.            10.  Wear clean pajamas.            11.  Place clean sheets on your bed the night of your first shower and do not  sleep with pets. Day of Surgery : Do not apply any lotions/deodorants the morning of surgery.  Please wear clean clothes to the hospital/surgery  center.  FAILURE TO FOLLOW THESE INSTRUCTIONS MAY RESULT IN THE CANCELLATION OF YOUR SURGERY  PATIENT SIGNATURE_________________________________  NURSE SIGNATURE__________________________________  ________________________________________________________________________

## 2024-02-13 ENCOUNTER — Other Ambulatory Visit: Payer: Self-pay

## 2024-02-13 ENCOUNTER — Encounter (HOSPITAL_COMMUNITY)
Admission: RE | Admit: 2024-02-13 | Discharge: 2024-02-13 | Disposition: A | Discharge status: Home / Self Care | Payer: Medicare HMO | Source: Ambulatory Visit | Attending: General Surgery | Admitting: General Surgery

## 2024-02-13 ENCOUNTER — Encounter (HOSPITAL_COMMUNITY): Payer: Self-pay

## 2024-02-13 VITALS — BP 152/98 | HR 76 | Temp 98.1°F | Resp 16 | Ht 74.0 in | Wt 220.0 lb

## 2024-02-13 DIAGNOSIS — E782 Mixed hyperlipidemia: Secondary | ICD-10-CM | POA: Diagnosis not present

## 2024-02-13 DIAGNOSIS — Z01812 Encounter for preprocedural laboratory examination: Secondary | ICD-10-CM | POA: Diagnosis not present

## 2024-02-13 DIAGNOSIS — I10 Essential (primary) hypertension: Secondary | ICD-10-CM | POA: Diagnosis not present

## 2024-02-13 DIAGNOSIS — E1169 Type 2 diabetes mellitus with other specified complication: Secondary | ICD-10-CM | POA: Diagnosis not present

## 2024-02-13 HISTORY — DX: Atherosclerotic heart disease of native coronary artery without angina pectoris: I25.10

## 2024-02-13 LAB — BASIC METABOLIC PANEL
Anion gap: 12 (ref 5–15)
BUN: 17 mg/dL (ref 8–23)
CO2: 24 mmol/L (ref 22–32)
Calcium: 9.3 mg/dL (ref 8.9–10.3)
Chloride: 103 mmol/L (ref 98–111)
Creatinine, Ser: 0.75 mg/dL (ref 0.61–1.24)
GFR, Estimated: >60 mL/min (ref >60)
Glucose, Bld: 118 mg/dL — ABNORMAL HIGH (ref 70–99)
Potassium: 3.3 mmol/L — ABNORMAL LOW (ref 3.5–5.1)
Sodium: 139 mmol/L (ref 135–145)

## 2024-02-13 LAB — CBC
HCT: 44.4 % (ref 39.0–52.0)
Hemoglobin: 14.9 g/dL (ref 13.0–17.0)
MCH: 29.8 pg (ref 26.0–34.0)
MCHC: 33.6 g/dL (ref 30.0–36.0)
MCV: 88.8 fL (ref 80.0–100.0)
Platelets: 233 10*3/uL (ref 150–400)
RBC: 5 MIL/uL (ref 4.22–5.81)
RDW: 13.7 % (ref 11.5–15.5)
WBC: 8.6 10*3/uL (ref 4.0–10.5)
nRBC: 0 % (ref 0.0–0.2)

## 2024-02-13 LAB — GLUCOSE, CAPILLARY: Glucose-Capillary: 131 mg/dL — ABNORMAL HIGH (ref 70–99)

## 2024-02-13 LAB — HEMOGLOBIN A1C
Hgb A1c MFr Bld: 5.7 % — ABNORMAL HIGH (ref 4.8–5.6)
Mean Plasma Glucose: 116.89 mg/dL

## 2024-02-16 ENCOUNTER — Encounter (HOSPITAL_COMMUNITY): Payer: Self-pay

## 2024-02-16 NOTE — Anesthesia Preprocedure Evaluation (Addendum)
 Anesthesia Evaluation  Patient identified by MRN, date of birth, ID band Patient awake    Reviewed: Allergy & Precautions, NPO status , Patient's Chart, lab work & pertinent test results, reviewed documented beta blocker date and time   History of Anesthesia Complications (+) PONV and history of anesthetic complications  Airway Mallampati: III  TM Distance: >3 FB Neck ROM: Full    Dental  (+) Teeth Intact, Dental Advisory Given   Pulmonary former smoker   Pulmonary exam normal breath sounds clear to auscultation       Cardiovascular hypertension (145/94 preop), Pt. on medications and Pt. on home beta blockers + CAD and + CABG  Normal cardiovascular exam Rhythm:Regular Rate:Normal  Echo 2023 (pre-CABG)  1. Left ventricular ejection fraction, by estimation, is 60 to 65%. The  left ventricle has normal function. The left ventricle has no regional  wall motion abnormalities. There is mild asymmetric left ventricular  hypertrophy of the basal-septal segment.  Left ventricular diastolic parameters are consistent with Grade I  diastolic dysfunction (impaired relaxation).   2. Right ventricular systolic function is normal. The right ventricular  size is normal. There is normal pulmonary artery systolic pressure.   3. Left atrial size was moderately dilated.   4. Right atrial size was mildly dilated.   5. The mitral valve is degenerative. No evidence of mitral valve  regurgitation. No evidence of mitral stenosis.   6. The aortic valve is tricuspid. There is mild calcification of the  aortic valve. Aortic valve regurgitation is not visualized. Aortic valve  sclerosis/calcification is present, without any evidence of aortic  stenosis.   7. Aortic dilatation noted. There is mild dilatation of the ascending  aorta, measuring 41 mm.   8. The inferior vena cava is normal in size with greater than 50%  respiratory variability, suggesting  right atrial pressure of 3 mmHg.    LHC 07/07/2022 (pre-CABG):   Prox RCA lesion is 80% stenosed.   Mid RCA lesion is 80% stenosed.   Ost RCA to Prox RCA lesion is 20% stenosed.   Dist RCA lesion is 20% stenosed.   Prox Cx to Mid Cx lesion is 60% stenosed.   Mid Cx lesion is 95% stenosed.   2nd Diag lesion is 60% stenosed.   Mid LAD-1 lesion is 60% stenosed.   Mid LAD-2 lesion is 99% stenosed.   Diffuse moderate heavily calcified mid LAD stenosis. Focal severe/sub-total LAD occlusion. Severe stenosis proximal segment of Diagonal 2.  The Circumflex has a moderately severe mid stenosis and a severe distal stenosis The RCA is a large, dominant vessel. The entire vessel is heavily calcified. The mid vessel has two severe lesions, both involving bulky calcification.  Normal LVEDP   Recommendations: Severe, diffuse multi-vessel CAD with heavily calcified coronary arteries in a diabetic patient. I think the best strategy for revascularization is CABG. I will make an outpatient referral to CT surgery to discuss bypass. He has normal LV function by echo. Continue ASA, statin and beta blocker    Neuro/Psych negative neurological ROS     GI/Hepatic negative GI ROS, Neg liver ROS,,,  Endo/Other  diabetes, Well Controlled, Type 2, Oral Hypoglycemic Agents    Renal/GU negative Renal ROS  negative genitourinary   Musculoskeletal  (+) Arthritis , Osteoarthritis,    Abdominal   Peds  Hematology negative hematology ROS (+)   Anesthesia Other Findings   Reproductive/Obstetrics negative OB ROS  Anesthesia Physical Anesthesia Plan  ASA: 3  Anesthesia Plan: General   Post-op Pain Management: Regional block* and Tylenol PO (pre-op)*   Induction: Intravenous  PONV Risk Score and Plan: 3 and Ondansetron, Dexamethasone, Midazolam and Treatment may vary due to age or medical condition  Airway Management Planned:  LMA  Additional Equipment: None  Intra-op Plan:   Post-operative Plan: Extubation in OR  Informed Consent: I have reviewed the patients History and Physical, chart, labs and discussed the procedure including the risks, benefits and alternatives for the proposed anesthesia with the patient or authorized representative who has indicated his/her understanding and acceptance.     Dental advisory given  Plan Discussed with: CRNA  Anesthesia Plan Comments:         Anesthesia Quick Evaluation

## 2024-02-16 NOTE — Progress Notes (Addendum)
DISCUSSION: Joseph Fowler is a 68 yo male who presents to PAT prior to RIGHT OPEN INGUINAL HERNIA REPAIR WITH MESH on 03/01/24 with Dr. Sheliah Hatch. PMH of former smoking, CAD s/p CABG (06/2022), HTN, DM (A1c 5.7), prostate cancer s/p prostatectomy (2021)  Prior anesthesia complications include PONV  Patient was evaluated by Cardiology in 2023 for exertional CP/SOB. Ultimately underwent LHC on 07/07/2022 and CABG was recommended due to severe diffuse CAD. CABG done at [facility] Last seen in clinic on 01/02/24 for pre op clearance. He denied any symptoms. Tolerating medical therapy. Advised f/u in 1 year. Cleared for surery:  "Preoperative cardiac evaluation: Patient to have inguinal hernia surgery with Dr. Feliciana Rossetti, date TBD. Mr. Tellefsen perioperative risk of a major cardiac event is 0.9% according to the Revised Cardiac Risk Index (RCRI).  Therefore, he is at low risk for perioperative complications.   His functional capacity is excellent at 9.89 METs according to the Duke Activity Status Index (DASI). Recommendations:According to ACC/AHA guidelines, no further cardiovascular testing needed.  The patient may proceed to surgery at acceptable risk.  Antiplatelet and/or Anticoagulation Recommendations: Aspirin can be held for 5-7 days prior to his surgery.  Please resume Aspirin post operatively when it is felt to be safe from a bleeding standpoint."   VS: BP (!) 152/98   Pulse 76   Temp 36.7 C (Oral)   Resp 16   Ht 6\' 2"  (1.88 m)   Wt 99.8 kg   SpO2 96%   BMI 28.25 kg/m   PROVIDERS: Bradd Canary, MD Cardiology: Reatha Harps, MD   LABS: Labs reviewed: Acceptable for surgery. Mild hypokalemia. Diabetes controlled. (all labs ordered are listed, but only abnormal results are displayed)  Labs Reviewed  HEMOGLOBIN A1C - Abnormal; Notable for the following components:      Result Value   Hgb A1c MFr Bld 5.7 (*)    All other components within normal limits  BASIC METABOLIC PANEL - Abnormal;  Notable for the following components:   Potassium 3.3 (*)    Glucose, Bld 118 (*)    All other components within normal limits  GLUCOSE, CAPILLARY - Abnormal; Notable for the following components:   Glucose-Capillary 131 (*)    All other components within normal limits  CBC     IMAGES:   EKG 01/02/2024:  Normal sinus rhythm, rate 69 Right bundle branch block No significant change since last tracing  CV:  LHC 07/07/2022:      Prox RCA lesion is 80% stenosed.   Mid RCA lesion is 80% stenosed.   Ost RCA to Prox RCA lesion is 20% stenosed.   Dist RCA lesion is 20% stenosed.   Prox Cx to Mid Cx lesion is 60% stenosed.   Mid Cx lesion is 95% stenosed.   2nd Diag lesion is 60% stenosed.   Mid LAD-1 lesion is 60% stenosed.   Mid LAD-2 lesion is 99% stenosed.   Diffuse moderate heavily calcified mid LAD stenosis. Focal severe/sub-total LAD occlusion. Severe stenosis proximal segment of Diagonal 2.  The Circumflex has a moderately severe mid stenosis and a severe distal stenosis The RCA is a large, dominant vessel. The entire vessel is heavily calcified. The mid vessel has two severe lesions, both involving bulky calcification.  Normal LVEDP   Recommendations: Severe, diffuse multi-vessel CAD with heavily calcified coronary arteries in a diabetic patient. I think the best strategy for revascularization is CABG. I will make an outpatient referral to CT surgery to discuss bypass. He  has normal LV function by echo. Continue ASA, statin and beta blocker.   Echo 06/17/2022:  IMPRESSIONS    1. Left ventricular ejection fraction, by estimation, is 60 to 65%. The left ventricle has normal function. The left ventricle has no regional wall motion abnormalities. There is mild asymmetric left ventricular hypertrophy of the basal-septal segment. Left ventricular diastolic parameters are consistent with Grade I diastolic dysfunction (impaired relaxation).  2. Right ventricular systolic  function is normal. The right ventricular size is normal. There is normal pulmonary artery systolic pressure.  3. Left atrial size was moderately dilated.  4. Right atrial size was mildly dilated.  5. The mitral valve is degenerative. No evidence of mitral valve regurgitation. No evidence of mitral stenosis.  6. The aortic valve is tricuspid. There is mild calcification of the aortic valve. Aortic valve regurgitation is not visualized. Aortic valve sclerosis/calcification is present, without any evidence of aortic stenosis.  7. Aortic dilatation noted. There is mild dilatation of the ascending aorta, measuring 41 mm.  8. The inferior vena cava is normal in size with greater than 50% respiratory variability, suggesting right atrial pressure of 3 mmHg. Past Medical History:  Diagnosis Date   Allergy    Arthritis 09/06/2013   Blood in stool 12/03/2012   BPH (benign prostatic hyperplasia) 09/12/2012   Chicken pox as a child   Dermatitis 09/12/2012   Diabetes mellitus without complication [facility])    ED (erectile dysfunction) 09/12/2012   Hyperlipidemia    Hypertension 68 yrs old   Nocturia 09/12/2012   Obesity 09/12/2012   Overweight(278.02) 09/12/2012   PONV (postoperative nausea and vomiting)    Has nausea   Preventative health care 09/12/2012   Prostate cancer (HCC)    Snoring 09/12/2012    Past Surgical History:  Procedure Laterality Date   APPENDECTOMY  68 yrs old   EYE SURGERY  12/27/2003   ,right, injury, decreased visual acuity, photophobia   HERNIA REPAIR     LEFT HEART CATH AND CORONARY ANGIOGRAPHY N/A 07/07/2022   Procedure: LEFT HEART CATH AND CORONARY ANGIOGRAPHY;  Surgeon: Kathleene Hazel, MD;  Location: [facility] INVASIVE CV LAB;  Service: Cardiovascular;  Laterality: N/A;   LYMPHADENECTOMY Bilateral 10/15/2020   Procedure: LYMPHADENECTOMY, PELVIC;  Surgeon: Heloise Purpura, MD;  Location: [facility] ORS;  Service: Urology;  Laterality: Bilateral;   PROSTATE BIOPSY      ROBOT ASSISTED LAPAROSCOPIC RADICAL PROSTATECTOMY N/A 10/15/2020   Procedure: XI ROBOTIC ASSISTED LAPAROSCOPIC RADICAL PROSTATECTOMY LEVEL 2;  Surgeon: Heloise Purpura, MD;  Location: [facility] ORS;  Service: Urology;  Laterality: N/A;   torn meninscus  12/26/2009   knee- right   VASECTOMY  68 yrs old    MEDICATIONS:  amLODipine (NORVASC) 5 MG tablet   Ascorbic Acid (VITAMIN C) 1000 MG tablet   aspirin EC 81 MG tablet   atorvastatin (LIPITOR) 40 MG tablet   benzonatate (TESSALON) 200 MG capsule   blood glucose meter kit and supplies   Blood Glucose Monitoring Suppl (ONETOUCH VERIO) w/Device KIT   Calcium Polycarbophil (FIBER-CAPS PO)   Cholecalciferol (VITAMIN D) 50 MCG (2000 UT) tablet   fluticasone (FLONASE) 50 MCG/ACT nasal spray   GEMTESA 75 MG TABS   glucose blood (ONETOUCH VERIO) test strip   loratadine (CLARITIN) 10 MG tablet   magnesium oxide (MAG-OX) 400 (240 Mg) MG tablet   metFORMIN (GLUCOPHAGE) 500 MG tablet   metoprolol tartrate (LOPRESSOR) 25 MG tablet   Multiple Vitamins-Minerals (MULTIVITAMIN WITH MINERALS) tablet   nitroGLYCERIN (NITROSTAT) 0.4 MG  SL tablet   OneTouch Delica Lancets 33G MISC   sildenafil (VIAGRA) 100 MG tablet   zolpidem (AMBIEN) 10 MG tablet   No current facility-administered medications for this encounter.   Marcille Blanco [facility] Surgical Short Stay/Anesthesiology [facility] Phone [phone] 02/16/2024 8:45 AM

## 2024-03-01 ENCOUNTER — Ambulatory Visit (HOSPITAL_COMMUNITY)
Admission: RE | Admit: 2024-03-01 | Discharge: 2024-03-01 | Disposition: A | Discharge status: Home / Self Care | Payer: Medicare HMO | Attending: General Surgery | Admitting: General Surgery

## 2024-03-01 ENCOUNTER — Ambulatory Visit (HOSPITAL_COMMUNITY): Payer: Self-pay | Admitting: Medical

## 2024-03-01 ENCOUNTER — Encounter (HOSPITAL_COMMUNITY)
Admission: RE | Disposition: A | Discharge status: Home / Self Care | Payer: Self-pay | Source: Home / Self Care | Attending: General Surgery

## 2024-03-01 ENCOUNTER — Other Ambulatory Visit: Payer: Self-pay

## 2024-03-01 ENCOUNTER — Encounter (HOSPITAL_COMMUNITY): Payer: Self-pay | Admitting: General Surgery

## 2024-03-01 DIAGNOSIS — Z87891 Personal history of nicotine dependence: Secondary | ICD-10-CM

## 2024-03-01 DIAGNOSIS — I358 Other nonrheumatic aortic valve disorders: Secondary | ICD-10-CM | POA: Insufficient documentation

## 2024-03-01 DIAGNOSIS — G8918 Other acute postprocedural pain: Secondary | ICD-10-CM | POA: Diagnosis not present

## 2024-03-01 DIAGNOSIS — I10 Essential (primary) hypertension: Secondary | ICD-10-CM | POA: Insufficient documentation

## 2024-03-01 DIAGNOSIS — E1159 Type 2 diabetes mellitus with other circulatory complications: Secondary | ICD-10-CM | POA: Diagnosis not present

## 2024-03-01 DIAGNOSIS — Z951 Presence of aortocoronary bypass graft: Secondary | ICD-10-CM | POA: Diagnosis not present

## 2024-03-01 DIAGNOSIS — K4090 Unilateral inguinal hernia, without obstruction or gangrene, not specified as recurrent: Secondary | ICD-10-CM | POA: Insufficient documentation

## 2024-03-01 DIAGNOSIS — E1169 Type 2 diabetes mellitus with other specified complication: Secondary | ICD-10-CM | POA: Diagnosis not present

## 2024-03-01 DIAGNOSIS — Z7984 Long term (current) use of oral hypoglycemic drugs: Secondary | ICD-10-CM | POA: Insufficient documentation

## 2024-03-01 DIAGNOSIS — E782 Mixed hyperlipidemia: Secondary | ICD-10-CM | POA: Insufficient documentation

## 2024-03-01 DIAGNOSIS — Z9079 Acquired absence of other genital organ(s): Secondary | ICD-10-CM | POA: Diagnosis not present

## 2024-03-01 DIAGNOSIS — M1990 Unspecified osteoarthritis, unspecified site: Secondary | ICD-10-CM | POA: Insufficient documentation

## 2024-03-01 DIAGNOSIS — Z79899 Other long term (current) drug therapy: Secondary | ICD-10-CM | POA: Insufficient documentation

## 2024-03-01 DIAGNOSIS — I2510 Atherosclerotic heart disease of native coronary artery without angina pectoris: Secondary | ICD-10-CM | POA: Diagnosis not present

## 2024-03-01 HISTORY — PX: INGUINAL HERNIA REPAIR: SHX194

## 2024-03-01 LAB — GLUCOSE, CAPILLARY
Glucose-Capillary: 125 mg/dL — ABNORMAL HIGH (ref 70–99)
Glucose-Capillary: 156 mg/dL — ABNORMAL HIGH (ref 70–99)

## 2024-03-01 SURGERY — REPAIR, HERNIA, INGUINAL, ADULT
Anesthesia: General | Laterality: Right

## 2024-03-01 MED ORDER — CELECOXIB 200 MG PO CAPS
400.0000 mg | ORAL_CAPSULE | ORAL | Status: AC
  Administered 2024-03-01: 400 mg via ORAL
  Filled 2024-03-01: qty 2

## 2024-03-01 MED ORDER — ONDANSETRON HCL 4 MG/2ML IJ SOLN
INTRAMUSCULAR | Status: DC | PRN
  Administered 2024-03-01: 4 mg via INTRAVENOUS

## 2024-03-01 MED ORDER — MIDAZOLAM HCL 5 MG/5ML IJ SOLN
INTRAMUSCULAR | Status: DC | PRN
  Administered 2024-03-01: 2 mg via INTRAVENOUS

## 2024-03-01 MED ORDER — LIDOCAINE HCL (PF) 2 % IJ SOLN
INTRAMUSCULAR | Status: DC | PRN
  Administered 2024-03-01: 100 mg via INTRADERMAL

## 2024-03-01 MED ORDER — BUPIVACAINE HCL (PF) 0.5 % IJ SOLN
INTRAMUSCULAR | Status: AC
  Filled 2024-03-01: qty 30

## 2024-03-01 MED ORDER — DEXAMETHASONE SODIUM PHOSPHATE 10 MG/ML IJ SOLN
INTRAMUSCULAR | Status: AC
Start: 2024-03-01 — End: ?
  Filled 2024-03-01: qty 1

## 2024-03-01 MED ORDER — PHENYLEPHRINE 80 MCG/ML (10ML) SYRINGE FOR IV PUSH (FOR BLOOD PRESSURE SUPPORT)
PREFILLED_SYRINGE | INTRAVENOUS | Status: DC | PRN
  Administered 2024-03-01: 80 ug via INTRAVENOUS

## 2024-03-01 MED ORDER — ROPIVACAINE HCL 5 MG/ML IJ SOLN
INTRAMUSCULAR | Status: DC | PRN
  Administered 2024-03-01: 20 mL via PERINEURAL

## 2024-03-01 MED ORDER — HYDROMORPHONE HCL 1 MG/ML IJ SOLN
INTRAMUSCULAR | Status: AC
  Administered 2024-03-01: 0.5 mg via INTRAVENOUS
  Filled 2024-03-01: qty 1

## 2024-03-01 MED ORDER — CHLORHEXIDINE GLUCONATE CLOTH 2 % EX PADS: 6.0000 | MEDICATED_PAD | Freq: Once | CUTANEOUS | Status: DC

## 2024-03-01 MED ORDER — EPHEDRINE SULFATE-NACL 50-0.9 MG/10ML-% IV SOSY
PREFILLED_SYRINGE | INTRAVENOUS | Status: DC | PRN
  Administered 2024-03-01: 10 mg via INTRAVENOUS

## 2024-03-01 MED ORDER — IBUPROFEN 800 MG PO TABS: 800.0000 mg | ORAL_TABLET | Freq: Three times a day (TID) | ORAL | 0 refills | Status: AC | PRN

## 2024-03-01 MED ORDER — LACTATED RINGERS IV SOLN: INTRAVENOUS | Status: DC

## 2024-03-01 MED ORDER — FENTANYL CITRATE (PF) 100 MCG/2ML IJ SOLN
INTRAMUSCULAR | Status: AC
  Filled 2024-03-01: qty 2

## 2024-03-01 MED ORDER — ONDANSETRON HCL 4 MG/2ML IJ SOLN
INTRAMUSCULAR | Status: AC
  Filled 2024-03-01: qty 2

## 2024-03-01 MED ORDER — LIDOCAINE HCL (PF) 2 % IJ SOLN
INTRAMUSCULAR | Status: AC
  Filled 2024-03-01: qty 5

## 2024-03-01 MED ORDER — PROPOFOL 500 MG/50ML IV EMUL
INTRAVENOUS | Status: AC
  Filled 2024-03-01: qty 50

## 2024-03-01 MED ORDER — EPHEDRINE 5 MG/ML INJ
INTRAVENOUS | Status: AC
  Filled 2024-03-01: qty 5

## 2024-03-01 MED ORDER — BUPIVACAINE LIPOSOME 1.3 % IJ SUSP
INTRAMUSCULAR | Status: DC | PRN
Start: 2024-03-01 — End: 2024-03-01
  Administered 2024-03-01: 10 mL via PERINEURAL

## 2024-03-01 MED ORDER — PHENYLEPHRINE 80 MCG/ML (10ML) SYRINGE FOR IV PUSH (FOR BLOOD PRESSURE SUPPORT)
PREFILLED_SYRINGE | INTRAVENOUS | Status: AC
  Filled 2024-03-01: qty 10

## 2024-03-01 MED ORDER — OXYCODONE HCL 5 MG/5ML PO SOLN: 5.0000 mg | Freq: Once | ORAL | Status: AC | PRN

## 2024-03-01 MED ORDER — FENTANYL CITRATE (PF) 100 MCG/2ML IJ SOLN
INTRAMUSCULAR | Status: DC | PRN
  Administered 2024-03-01 (×2): 50 ug via INTRAVENOUS
  Administered 2024-03-01: 100 ug via INTRAVENOUS

## 2024-03-01 MED ORDER — ONDANSETRON HCL 4 MG/2ML IJ SOLN: 4.0000 mg | Freq: Once | INTRAMUSCULAR | Status: DC | PRN

## 2024-03-01 MED ORDER — HYDROMORPHONE HCL 1 MG/ML IJ SOLN
0.2500 mg | INTRAMUSCULAR | Status: DC | PRN
  Administered 2024-03-01 (×2): 0.5 mg via INTRAVENOUS

## 2024-03-01 MED ORDER — DEXAMETHASONE SODIUM PHOSPHATE 10 MG/ML IJ SOLN
INTRAMUSCULAR | Status: DC | PRN
  Administered 2024-03-01: 10 mg via INTRAVENOUS

## 2024-03-01 MED ORDER — CHLORHEXIDINE GLUCONATE 0.12 % MT SOLN
15.0000 mL | Freq: Once | OROMUCOSAL | Status: AC
  Administered 2024-03-01: 15 mL via OROMUCOSAL

## 2024-03-01 MED ORDER — AMISULPRIDE (ANTIEMETIC) 5 MG/2ML IV SOLN
10.0000 mg | Freq: Once | INTRAVENOUS | Status: AC | PRN
  Administered 2024-03-01: 10 mg via INTRAVENOUS

## 2024-03-01 MED ORDER — METHOCARBAMOL 1000 MG/10ML IJ SOLN: 500.0000 mg | Freq: Once | INTRAMUSCULAR | Status: AC | PRN

## 2024-03-01 MED ORDER — OXYCODONE HCL 5 MG PO TABS: 5.0000 mg | ORAL_TABLET | Freq: Four times a day (QID) | ORAL | 0 refills | Status: DC | PRN

## 2024-03-01 MED ORDER — METHOCARBAMOL 1000 MG/10ML IJ SOLN
INTRAMUSCULAR | Status: AC
Start: 2024-03-01 — End: 2024-03-01
  Administered 2024-03-01: 500 mg via INTRAVENOUS
  Filled 2024-03-01: qty 10

## 2024-03-01 MED ORDER — BUPIVACAINE LIPOSOME 1.3 % IJ SUSP
INTRAMUSCULAR | Status: AC
  Filled 2024-03-01: qty 20

## 2024-03-01 MED ORDER — OXYCODONE HCL 5 MG PO TABS
ORAL_TABLET | ORAL | Status: AC
  Filled 2024-03-01: qty 1

## 2024-03-01 MED ORDER — OXYCODONE HCL 5 MG PO TABS
5.0000 mg | ORAL_TABLET | Freq: Once | ORAL | Status: AC | PRN
  Administered 2024-03-01: 5 mg via ORAL

## 2024-03-01 MED ORDER — ACETAMINOPHEN 500 MG PO TABS
1000.0000 mg | ORAL_TABLET | ORAL | Status: AC
  Administered 2024-03-01: 1000 mg via ORAL
  Filled 2024-03-01: qty 2

## 2024-03-01 MED ORDER — CEFAZOLIN SODIUM-DEXTROSE 2-4 GM/100ML-% IV SOLN
2.0000 g | INTRAVENOUS | Status: AC
Start: 2024-03-01 — End: 2024-03-01
  Administered 2024-03-01: 2 g via INTRAVENOUS
  Filled 2024-03-01: qty 100

## 2024-03-01 MED ORDER — PROPOFOL 10 MG/ML IV BOLUS
INTRAVENOUS | Status: DC | PRN
  Administered 2024-03-01: 200 mg via INTRAVENOUS

## 2024-03-01 MED ORDER — ORAL CARE MOUTH RINSE: 15.0000 mL | Freq: Once | OROMUCOSAL | Status: AC

## 2024-03-01 MED ORDER — MIDAZOLAM HCL 2 MG/2ML IJ SOLN
INTRAMUSCULAR | Status: AC
  Filled 2024-03-01: qty 2

## 2024-03-01 MED ORDER — 0.9 % SODIUM CHLORIDE (POUR BTL) OPTIME
TOPICAL | Status: DC | PRN
  Administered 2024-03-01: 1000 mL

## 2024-03-01 MED ORDER — BUPIVACAINE HCL 0.5 % IJ SOLN
INTRAMUSCULAR | Status: DC | PRN
  Administered 2024-03-01: 20 mL

## 2024-03-01 MED ORDER — AMISULPRIDE (ANTIEMETIC) 5 MG/2ML IV SOLN
INTRAVENOUS | Status: AC
  Filled 2024-03-01: qty 4

## 2024-03-01 SURGICAL SUPPLY — 37 items
BAG COUNTER SPONGE SURGICOUNT (BAG) ×2 IMPLANT
BENZOIN TINCTURE PRP APPL 2/3 (GAUZE/BANDAGES/DRESSINGS) IMPLANT
BLADE SURG 15 STRL LF DISP TIS (BLADE) ×2 IMPLANT
CHLORAPREP W/TINT 26 (MISCELLANEOUS) ×2 IMPLANT
COVER SURGICAL LIGHT HANDLE (MISCELLANEOUS) ×2 IMPLANT
DERMABOND ADVANCED .7 DNX12 (GAUZE/BANDAGES/DRESSINGS) ×2 IMPLANT
DRAIN PENROSE 0.5X18 (DRAIN) IMPLANT
DRAPE LAPAROTOMY TRNSV 102X78 (DRAPES) ×2 IMPLANT
DRAPE UTILITY XL STRL (DRAPES) ×2 IMPLANT
DRSG TELFA PLUS 4X6 ADH ISLAND (GAUZE/BANDAGES/DRESSINGS) IMPLANT
ELECT REM PT RETURN 15FT ADLT (MISCELLANEOUS) ×2 IMPLANT
GAUZE SPONGE 4X4 12PLY STRL (GAUZE/BANDAGES/DRESSINGS) IMPLANT
GLOVE BIOGEL PI IND STRL 7.0 (GLOVE) IMPLANT
GLOVE SURG SS PI 7.0 STRL IVOR (GLOVE) ×2 IMPLANT
GOWN STRL REUS W/ TWL LRG LVL3 (GOWN DISPOSABLE) ×2 IMPLANT
GOWN STRL REUS W/ TWL XL LVL3 (GOWN DISPOSABLE) IMPLANT
KIT BASIN OR (CUSTOM PROCEDURE TRAY) ×2 IMPLANT
KIT TURNOVER KIT A (KITS) IMPLANT
MARKER SKIN DUAL TIP RULER LAB (MISCELLANEOUS) ×2 IMPLANT
MESH HERNIA 3X6 (Mesh General) IMPLANT
NDL HYPO 22X1.5 SAFETY MO (MISCELLANEOUS) ×2 IMPLANT
NEEDLE HYPO 22X1.5 SAFETY MO (MISCELLANEOUS) ×1 IMPLANT
PACK BASIC VI WITH GOWN DISP (CUSTOM PROCEDURE TRAY) ×2 IMPLANT
PENCIL SMOKE EVACUATOR (MISCELLANEOUS) ×2 IMPLANT
SPIKE FLUID TRANSFER (MISCELLANEOUS) ×2 IMPLANT
SPONGE T-LAP 4X18 ~~LOC~~+RFID (SPONGE) ×2 IMPLANT
STRIP CLOSURE SKIN 1/2X4 (GAUZE/BANDAGES/DRESSINGS) IMPLANT
SUT MNCRL AB 4-0 PS2 18 (SUTURE) ×2 IMPLANT
SUT PDS AB 2-0 CT2 27 (SUTURE) ×2 IMPLANT
SUT PROLENE 2 0 CT2 30 (SUTURE) ×4 IMPLANT
SUT VIC AB 3-0 SH 18 (SUTURE) IMPLANT
SUT VIC AB 3-0 SH 27XBRD (SUTURE) IMPLANT
SUT VICRYL 3-0 CR8 SH (SUTURE) ×2 IMPLANT
SYR BULB IRRIG 60ML STRL (SYRINGE) ×2 IMPLANT
SYR CONTROL 10ML LL (SYRINGE) ×2 IMPLANT
TOWEL OR 17X26 10 PK STRL BLUE (TOWEL DISPOSABLE) ×2 IMPLANT
YANKAUER SUCT BULB TIP 10FT TU (MISCELLANEOUS) IMPLANT

## 2024-03-01 NOTE — Discharge Instructions (Signed)

## 2024-03-01 NOTE — Anesthesia Procedure Notes (Signed)
 Procedure Name: LMA Insertion Date/Time: 03/01/2024 7:39 AM  Performed by: Doran Clay, CRNAPre-anesthesia Checklist: Patient identified, Emergency Drugs available, Suction available and Patient being monitored Patient Re-evaluated:Patient Re-evaluated prior to induction Oxygen Delivery Method: Circle system utilized Preoxygenation: Pre-oxygenation with 100% oxygen Induction Type: IV induction LMA: LMA inserted LMA Size: 5.0 Tube type: Oral Number of attempts: 1 Placement Confirmation: positive ETCO2 and breath sounds checked- equal and bilateral Tube secured with: Tape Dental Injury: Teeth and Oropharynx as per pre-operative assessment

## 2024-03-01 NOTE — Op Note (Signed)
 Preop diagnosis: right inguinal hernia  Postop diagnosis: right indirect and direct inguinal hernia  Procedure: open Right inguinal hernia repair with mesh  Surgeon: Feliciana Rossetti, M.D.  Asst: none  Anesthesia: Gen.   Indications for procedure: Joseph Fowler is a 68 y.o. male with symptoms of pain and enlarging Right inguinal hernia(s). After discussing risks, alternatives and benefits he decided on open repair and was brought to day surgery for repair.  Description of procedure: The patient was brought into the operative suite, placed supine. Anesthesia was administered with endotracheal tube. Patient was strapped in place. The patient was prepped and draped in the usual sterile fashion.  The anterior superior iliac spine and pubic tubercle were identified on the Right side. An incision was made 1cm above the connecting line, representative of the location of the inguinal ligament. The subcutaneous tissue was bluntly dissected, scarpa's fascia was dissected away. The external abdominal oblique fascia was identified and sharply opened down to the external inguinal ring. The conjoint tendon and inguinal ligament were identified. The cord structures and sac were dissected free of the surrounding tissue in 360 degrees. A penrose drain was used to encircle the contents. The cremasteric fibers were dissected free of the contents of the cord and hernia sac. The cord structures (vessels and vas deferens) were identified and carefully dissected away from the hernia sac. There were both a moderate indirect and large direct hernias. The hernia sac was reduced and contained no visceral structures.The hernia sac was dissected down to the internal inguinal ring. Preperitoneal fat was identified showing appropriate dissection. The sac was then reduced into the preperitoneal space. The canal floor was large and closed with interrupted 2-0 PDS apposing the conjoint tendon to the inguinal ligament medially. A 3x6  Bard mesh was then used to close the defect and reinforce the floor. The mesh was sutured to the lacunar ligament and inguinal ligament using a 2-0 prolene in running fashion. Next the superior edge of the mesh was sutured to the conjoined tendon using a 2-0 running Prolene. An additional 2-0 Prolene was used to suture the tail ends of the mesh together re-creating the deep ring. Cord structures are running in a neutral position through the mesh. Next the external abdominal oblique fascia was closed with a 2-0 Vicryl in interrupted fashion to re-create the external inguinal ring. Scarpa's fascia was closed with 3-0 Vicryl in running fashion. Skin was closed with a 4-0 Monocryl subcuticular stitch in running fashion. Dermabond place for dressing. Patient woke from anesthesia and brought to PACU in stable condition. All counts are correct.    Findings: right direct and indirect inguinal hernia  Specimen: none  Blood loss: 20 ml  Local anesthesia: 20 ml Marcaine  Complications: none  Implant: 3 x 6 in Bard mesh  Feliciana Rossetti, M.D. General, Bariatric, & Minimally Invasive Surgery [facility], [place] 8:50 AM 03/01/2024

## 2024-03-01 NOTE — H&P (Signed)
 Chief Complaint: Inguinal Hernia  History of Present Illness: Joseph Fowler is a 68 y.o. male who is seen today as an office consultation for evaluation of Inguinal Hernia  He noticed a right sided bulge. It was very painful the first day and now is just occasionally a sore feeling. He denies nausea or vomiting.  He does not smoke. He does have diabetes that is controlled. He had a left inguinal hernia when he was 68 yo  Review of Systems: A complete review of systems was obtained from the patient. I have reviewed this information and discussed as appropriate with the patient. See HPI as well for other ROS.  Review of Systems  Constitutional: Negative.  HENT: Negative.  Eyes: Negative.  Respiratory: Negative.  Cardiovascular: Negative.  Gastrointestinal: Negative.  Genitourinary: Negative.  Musculoskeletal: Negative.  Skin: Negative.  Neurological: Negative.  Endo/Heme/Allergies: Negative.  Psychiatric/Behavioral: Negative.    Medical History: Past Medical History:  Diagnosis Date  BPH (benign prostatic hyperplasia) 09/12/2012  Coronary atherosclerosis due to lipid rich plaque 07/12/2022  DM type 2 with diabetic mixed hyperlipidemia (CMS/HHS-HCC) 12/22/2013  ED (erectile dysfunction) 09/12/2012  Essential hypertension 07/12/2022  Former smoker 07/12/2022  History of cancer  Hyperlipidemia, mixed 12/22/2013  Obesity 09/12/2012  Snoring 09/12/2012   Patient Active Problem List  Diagnosis  Cancer of prostate w/low recurrence risk (T1-2a, Gleason<7 & PSA<10) (CMS/HHS-HCC)  BPH (benign prostatic hyperplasia)  DM type 2 with diabetic mixed hyperlipidemia (CMS/HHS-HCC)  ED (erectile dysfunction)  Hyperlipidemia, mixed  Obesity  Essential hypertension  Coronary atherosclerosis due to lipid rich plaque  Diabetes mellitus, type 2 (CMS/HHS-HCC)  S/P CABG x 3   Past Surgical History:  Procedure Laterality Date  CORONARY ARTERY BYPASS W/SINGLE ARTERY GRAFT N/A 07/22/2022   Procedure: CORONARY ARTERY BYPASS, USING ARTERIAL GRAFT(S); SINGLE ARTERIAL GRAFT, left internal mammary artery; Surgeon: Moreen Fowler, MD; Location: [facility]; Service: Cardiothoracic; Laterality: N/A;  CORONARY ARTERY BYPASS W/VEIN ONLY N/A 07/22/2022  Procedure: CORONARY ARTERY BYPASS, USING VENOUS GRAFT(S) AND ARTERIAL GRAFT(S);SINGLE VEIN GRAFT (LIST IN ADDITION TO PRIMARY PROCEDURE); Surgeon: Moreen Fowler, MD; Location: [facility]; Service: Cardiothoracic; Laterality: N/A;  ENDOSCOPIC HARVEST OF VEINS FOR CORONARY ARTERY BYPASS PROCEDURE Left 07/22/2022  Procedure: ENDOSCOPY, SURGICAL, INCLUDING VIDEO-ASSISTED HARVEST OF VEIN(S) FOR CORONARY ARTERY BYPASS PROCEDURE (LIST IN ADDITION TO PRIMARY PROCEDURE); Surgeon: Moreen Fowler, MD; Location: [facility]; Service: Cardiothoracic; Laterality: Left;  Hx: Appendectomy  Hx: Inguinal herniorrhaphy  Hx: Pelvic lymphadenectomy robotic assisted prostatectomy  Hx: Right knee meniscectomy  Hx: Vasectomy    No Known Allergies  Current Outpatient Medications on File Prior to Visit  Medication Sig Dispense Refill  amLODIPine (NORVASC) 5 MG tablet Take 1 tablet by mouth every 12 (twelve) hours  ACCU-CHEK GUIDE GLUCOSE METER Misc as directed  aspirin 81 MG EC tablet Take 81 mg by mouth at bedtime  atorvastatin (LIPITOR) 40 MG tablet Take 40 mg by mouth once daily  CALCIUM POLYCARBOPHIL ORAL Take 2 tablets by mouth once daily  fluticasone propionate (FLONASE) 50 mcg/actuation nasal spray Place 2 sprays into both nostrils once daily  GEMTESA 75 mg Tab Take 1 tablet by mouth once daily  metFORMIN (GLUCOPHAGE) 500 MG tablet Take 2 tablets (1,000 mg total) by mouth 2 (two) times daily with meals 120 tablet 3  metoprolol tartrate 37.5 mg Tab Take 37.5 mg by mouth every 12 (twelve) hours 90 tablet 1  polyethylene glycol (MIRALAX) packet Take 1 packet (17 g total) by mouth once daily as needed  for  Constipation Mix in 4-8ounces of fluid prior to taking. 0   No current facility-administered medications on file prior to visit.   Family History  Problem Relation Age of Onset  Dementia Mother  High blood pressure (Hypertension) Father  Diabetes Father  Coronary Artery Disease (Blocked arteries around heart) Brother  Heart disease Brother  Stroke Maternal Grandmother    Social History   Tobacco Use  Smoking Status Former  Current packs/day: 0.00  Average packs/day: 2.0 packs/day for 4.0 years (8.0 ttl pk-yrs)  Types: Cigarettes  Start date: 12/26/1977  Quit date: 12/26/1981  Years since quitting: 41.9  Smokeless Tobacco Never    Social History   Socioeconomic History  Marital status: Married  Tobacco Use  Smoking status: Former  Current packs/day: 0.00  Average packs/day: 2.0 packs/day for 4.0 years (8.0 ttl pk-yrs)  Types: Cigarettes  Start date: 12/26/1977  Quit date: 12/26/1981  Years since quitting: 41.9  Smokeless tobacco: Never  Vaping Use  Vaping status: Never Used  Substance and Sexual Activity  Alcohol use: Yes  Comment: Very rare: Once or twice per year  Drug use: Never  Sexual activity: Defer   Social Drivers of Health   Financial Resource Strain: Low Risk (11/20/2023)  Received from [facility]  Overall Financial Resource Strain (CARDIA)  Difficulty of Paying Living Expenses: Not hard at all  Food Insecurity: No Food Insecurity (11/20/2023)  Received from [facility]  Hunger Vital Sign  Worried About Running Out of Food in the Last Year: Never true  Ran Out of Food in the Last Year: Never true  Transportation Needs: No Transportation Needs (11/20/2023)  Received from [facility] - Transportation  Lack of Transportation (Medical): No  Lack of Transportation (Non-Medical): No  Physical Activity: Sufficiently Active (11/20/2023)  Received from [facility]  Exercise Vital Sign  Days of Exercise per Week: 5 days  Minutes of Exercise per  Session: 60 min  Stress: No Stress Concern Present (11/20/2023)  Received from [facility] - Occupational Stress Questionnaire  Feeling of Stress : Not at all  Social Connections: Moderately Isolated (11/20/2023)  Received from [facility]  Social Connection and Isolation Panel [NHANES]  Frequency of Communication with Friends and Family: Three times a week  Frequency of Social Gatherings with Friends and Family: Twice a week  Attends Religious Services: Never  Database administrator or Organizations: No  Marital Status: Married   Objective:   Vitals:  12/07/23 1030  BP: (!) 156/82  Pulse: 84  Temp: 36.6 C (97.9 F)  SpO2: 98%  Weight: (!) 103.5 kg (228 lb 3.2 oz)  Height: 188 cm (6\' 2" )  PainSc: 2   Body mass index is 29.3 kg/m.  Physical Exam Constitutional:  Appearance: Normal appearance.  HENT:  Head: Normocephalic and atraumatic.  Pulmonary:  Effort: Pulmonary effort is normal.  Abdominal:  Comments: Right sided hernia, no left hernia, noted left groin scar  Musculoskeletal:  General: Normal range of motion.  Cervical back: Normal range of motion.  Neurological:  General: No focal deficit present.  Mental Status: He is alert and oriented to person, place, and time. Mental status is at baseline.  Psychiatric:  Mood and Affect: Mood normal.  Behavior: Behavior normal.  Thought Content: Thought content normal.     Labs, Imaging and Diagnostic Testing: I reviewed notes by Danise Edge I reviewed CT scan images showing a fat filled right inguinal hernia and no left  inguinal hernia  Assessment and Plan:   Diagnoses and all orders for this visit:  Non-recurrent unilateral inguinal hernia without obstruction or gangrene  Type 2 diabetes mellitus with other circulatory complication, without long-term current use of insulin (CMS/HHS-HCC)  H/O prostatectomy   We discussed etiology of hernias and how they can cause  pain. We discussed options for inguinal hernia repair vs observation. We discussed details of the surgery of general anesthesia, surgical approach and incisions, dissecting the sack away from vas deference, testicular vessels and nerves and placement of mesh. We discussed risks of bleeding, infection, recurrence, injury to vas deference, testicular vessels, nerve injury, and chronic pain. He showed good understanding and wanted proceed with open right inguinal hernia repair as outpatient.   We discussed that open repair would be better because the lower extraperitoneal space was violated by his prostatectomy and that is was a unilateral hernia.

## 2024-03-01 NOTE — Anesthesia Postprocedure Evaluation (Signed)
 Anesthesia Post Note  Patient: Joseph Fowler  Procedure(s) Performed: RIGHT OPEN INGUINAL HERNIA REPAIR WITH MESH (Right)     Patient location during evaluation: PACU Anesthesia Type: General Level of consciousness: awake and alert, oriented and patient cooperative Pain management: pain level controlled Vital Signs Assessment: post-procedure vital signs reviewed and stable Respiratory status: spontaneous breathing, nonlabored ventilation and respiratory function stable Cardiovascular status: blood pressure returned to baseline and stable Postop Assessment: no apparent nausea or vomiting Anesthetic complications: no   No notable events documented.  Last Vitals:  Vitals:   03/01/24 0915 03/01/24 0930  BP: (!) 150/92 (!) 148/89  Pulse: 67 66  Resp: (!) 22 17  Temp:    SpO2: 100% 97%    Last Pain:  Vitals:   03/01/24 0933  TempSrc:   PainSc: 7                  Lannie Fields

## 2024-03-01 NOTE — Transfer of Care (Signed)
 Immediate Anesthesia Transfer of Care Note  Patient: Joseph Fowler  Procedure(s) Performed: RIGHT OPEN INGUINAL HERNIA REPAIR WITH MESH (Right)  Patient Location: PACU  Anesthesia Type:General  Level of Consciousness: sedated  Airway & Oxygen Therapy: Patient Spontanous Breathing and Patient connected to face mask oxygen  Post-op Assessment: Report given to RN and Post -op Vital signs reviewed and stable  Post vital signs: Reviewed and stable  Last Vitals:  Vitals Value Taken Time  BP    Temp    Pulse 65 03/01/24 0906  Resp 14 03/01/24 0906  SpO2 100 % 03/01/24 0906  Vitals shown include unfiled device data.  Last Pain:  Vitals:   03/01/24 0610  TempSrc:   PainSc: 0-No pain         Complications: No notable events documented.

## 2024-03-01 NOTE — Anesthesia Procedure Notes (Signed)
 Anesthesia Regional Block: TAP block   Pre-Anesthetic Checklist: , timeout performed,  Correct Patient, Correct Site, Correct Laterality,  Correct Procedure, Correct Position, site marked,  Risks and benefits discussed,  Surgical consent,  Pre-op evaluation,  At surgeon's request and post-op pain management  Laterality: Right  Prep: Maximum Sterile Barrier Precautions used, chloraprep       Needles:  Injection technique: Single-shot  Needle Type: Echogenic Stimulator Needle     Needle Length: 9cm  Needle Gauge: 22     Additional Needles:   Procedures:,,,, ultrasound used (permanent image in chart),,    Narrative:  Start time: 03/01/2024 7:10 AM End time: 03/01/2024 7:15 AM Injection made incrementally with aspirations every 5 mL.  Performed by: Personally  Anesthesiologist: Lannie Fields, DO  Additional Notes: Monitors applied. No increased pain on injection. No increased resistance to injection. Injection made in 5cc increments. Good needle visualization. Patient tolerated procedure well.

## 2024-03-04 ENCOUNTER — Encounter (HOSPITAL_COMMUNITY): Payer: Self-pay | Admitting: General Surgery

## 2024-03-05 ENCOUNTER — Encounter: Payer: Self-pay | Admitting: Family Medicine

## 2024-03-05 ENCOUNTER — Encounter: Payer: Self-pay | Admitting: Cardiovascular Disease

## 2024-03-13 ENCOUNTER — Encounter: Payer: Self-pay | Admitting: Dermatology

## 2024-03-13 ENCOUNTER — Ambulatory Visit (INDEPENDENT_AMBULATORY_CARE_PROVIDER_SITE_OTHER): Payer: Medicare HMO | Admitting: Dermatology

## 2024-03-13 VITALS — BP 151/97

## 2024-03-13 DIAGNOSIS — Z85828 Personal history of other malignant neoplasm of skin: Secondary | ICD-10-CM | POA: Diagnosis not present

## 2024-03-13 DIAGNOSIS — C4491 Basal cell carcinoma of skin, unspecified: Secondary | ICD-10-CM

## 2024-03-13 DIAGNOSIS — L905 Scar conditions and fibrosis of skin: Secondary | ICD-10-CM

## 2024-03-13 DIAGNOSIS — L570 Actinic keratosis: Secondary | ICD-10-CM

## 2024-03-13 DIAGNOSIS — D099 Carcinoma in situ, unspecified: Secondary | ICD-10-CM

## 2024-03-13 NOTE — Patient Instructions (Signed)

## 2024-03-13 NOTE — Progress Notes (Signed)
   Follow-Up Visit   Subjective  Joseph Fowler is a 68 y.o. male who presents for the following: History of BCC, SCC, Aks - he is here for a skin check of his face and neck today.  He is s/p Mohs for an [facility] on the left lateral neck, treated on 10/05/2023. He is s/p cryo therapy to an SCCIS biopsied on his left cheek and an HAK on his right nose.  The following portions of the chart were reviewed this encounter and updated as appropriate: medications, allergies, medical history  Review of Systems:  No other skin or systemic complaints except as noted in HPI or Assessment and Plan.  Objective  Well appearing patient in no apparent distress; mood and affect are within normal limits.   A focused examination was performed of the following areas: Face  Relevant exam findings are noted in the Assessment and Plan.  Right Ear Erythematous thin papules/macules with gritty scale.   Assessment & Plan   Scar s/p Mohs for [facility] on left lateral neck, treated on 10/05/2023, repaired with linear closure - Reassured that wound has healed well - Discussed that scars take up to 12 months to mature from the date of surgery - Recommend SPF 30+ to scar daily to prevent purple color - OK to start scar massage at 4-6 weeks post-op - Can consider silicone based products for scar healing  HISTORY OF BASAL CELL CARCINOMA OF THE SKIN - No evidence of recurrence today - Recommend regular full body skin exams - Recommend daily broad spectrum sunscreen SPF 30+ to sun-exposed areas, reapply every 2 hours as needed.  - Call if any new or changing lesions are noted between office visits  HISTORY OF SQUAMOUS CELL CARCINOMA OF THE SKIN - No evidence of recurrence today - No lymphadenopathy - Recommend regular full body skin exams - Recommend daily broad spectrum sunscreen SPF 30+ to sun-exposed areas, reapply every 2 hours as needed.  - Call if any new or changing lesions are noted between office visits  AK  (ACTINIC KERATOSIS) Right Ear Destruction of lesion - Right Ear Complexity: simple   Destruction method: cryotherapy   Informed consent: discussed and consent obtained   Timeout:  patient name, date of birth, surgical site, and procedure verified Lesion destroyed using liquid nitrogen: Yes   Region frozen until ice ball extended beyond lesion: Yes   Outcome: patient tolerated procedure well with no complications   Post-procedure details: wound care instructions given   SQUAMOUS CELL CARCINOMA IN SITU (SCCIS)   BASAL CELL CARCINOMA (BCC), UNSPECIFIED SITE   SCAR    Return in about 6 months (around 09/13/2024) for TBSE.  I, Joanie Coddington, CMA, am acting as scribe for Gwenith Daily, MD .   Documentation: I have reviewed the above documentation for accuracy and completeness, and I agree with the above.  Gwenith Daily, MD

## 2024-03-17 ENCOUNTER — Other Ambulatory Visit: Payer: Self-pay | Admitting: Family Medicine

## 2024-03-17 DIAGNOSIS — I10 Essential (primary) hypertension: Secondary | ICD-10-CM

## 2024-03-18 NOTE — Telephone Encounter (Signed)
 Copied from CRM [id number]. Topic: Medicare AWV >> Mar 18, 2024 10:21 AM [name] wrote: Reason for CRM: Called LVM 03/18/2024 to schedule AWV. Please schedule Virtual or Telehealth visits ONLY.   Joseph Fowler; Care Guide Ambulatory Clinical Support [place] [facility] Direct Dial: [phone]

## 2024-04-23 DIAGNOSIS — R051 Acute cough: Secondary | ICD-10-CM | POA: Diagnosis not present

## 2024-04-23 DIAGNOSIS — J209 Acute bronchitis, unspecified: Secondary | ICD-10-CM | POA: Diagnosis not present

## 2024-05-03 ENCOUNTER — Other Ambulatory Visit: Payer: Self-pay | Admitting: Family Medicine

## 2024-05-06 ENCOUNTER — Other Ambulatory Visit: Payer: Self-pay | Admitting: Family Medicine

## 2024-05-06 NOTE — Telephone Encounter (Signed)
 Requesting: Ambien  10 MG Contract: No UDS: 06/13/2023 Last Visit: 06/13/2023 Next Visit: Visit date not found Last Refill: 11/01/23  Please Advise

## 2024-05-28 ENCOUNTER — Other Ambulatory Visit: Payer: Self-pay | Admitting: Family Medicine

## 2024-05-31 DIAGNOSIS — S90851A Superficial foreign body, right foot, initial encounter: Secondary | ICD-10-CM | POA: Diagnosis not present

## 2024-06-07 DIAGNOSIS — S90851A Superficial foreign body, right foot, initial encounter: Secondary | ICD-10-CM | POA: Diagnosis not present

## 2024-06-07 DIAGNOSIS — L600 Ingrowing nail: Secondary | ICD-10-CM | POA: Diagnosis not present

## 2024-06-13 ENCOUNTER — Encounter: Payer: Self-pay | Admitting: Pharmacist

## 2024-06-13 NOTE — Progress Notes (Signed)
 Pharmacy Quality Measure Review  This patient is appearing on a report for being at risk of failing the adherence measure for diabetes medications this calendar year.   Medication: metformin  Last fill date: 01/17/2024 for 90 day supply per adherence report  Reviewed refill history - patient filled metformin  for 30 day supply on 05/08/2023 and then for 84 day supply on 05/30/2024  Insurance report was not up to date. No action needed at this time.   Cecilie Coffee, PharmD Clinical Pharmacist [facility]  Population Health [phone]

## 2024-06-30 NOTE — Assessment & Plan Note (Signed)
 [id number] acceptable, minimize simple carbs. Increase exercise as tolerated. Continue current meds

## 2024-06-30 NOTE — Assessment & Plan Note (Signed)
 Continues to follow with urology.

## 2024-06-30 NOTE — Assessment & Plan Note (Signed)
Encourage heart healthy diet such as MIND or DASH diet, increase exercise, avoid trans fats, simple carbohydrates and processed foods, consider a krill or fish or flaxseed oil cap daily. Tolerating Lipitor 

## 2024-06-30 NOTE — Assessment & Plan Note (Signed)
Mildly elevated but patient very stressed with running around today, no changes to meds. Encouraged heart healthy diet such as the DASH diet and exercise as tolerated. He is encouraged to monitor his resting BP at home and report them to us so we can adjust meds if they remain elevated at home 

## 2024-06-30 NOTE — Assessment & Plan Note (Signed)
Encouraged good sleep hygiene such as dark, quiet room. No blue/green glowing lights such as computer screens in bedroom. No alcohol or stimulants in evening. Cut down on caffeine as able. Regular exercise is helpful but not just prior to bed time.  He is using Ambien prn and does not need it most days

## 2024-07-04 ENCOUNTER — Encounter: Payer: Self-pay | Admitting: Family Medicine

## 2024-07-04 ENCOUNTER — Ambulatory Visit (INDEPENDENT_AMBULATORY_CARE_PROVIDER_SITE_OTHER): Admitting: Family Medicine

## 2024-07-04 ENCOUNTER — Ambulatory Visit: Payer: Self-pay | Admitting: Family Medicine

## 2024-07-04 VITALS — BP 130/80 | HR 61 | Ht 74.0 in | Wt 222.8 lb

## 2024-07-04 DIAGNOSIS — C61 Malignant neoplasm of prostate: Secondary | ICD-10-CM | POA: Diagnosis not present

## 2024-07-04 DIAGNOSIS — I10 Essential (primary) hypertension: Secondary | ICD-10-CM | POA: Diagnosis not present

## 2024-07-04 DIAGNOSIS — E782 Mixed hyperlipidemia: Secondary | ICD-10-CM | POA: Diagnosis not present

## 2024-07-04 DIAGNOSIS — H9190 Unspecified hearing loss, unspecified ear: Secondary | ICD-10-CM | POA: Diagnosis not present

## 2024-07-04 DIAGNOSIS — H9319 Tinnitus, unspecified ear: Secondary | ICD-10-CM | POA: Diagnosis not present

## 2024-07-04 DIAGNOSIS — G4700 Insomnia, unspecified: Secondary | ICD-10-CM

## 2024-07-04 DIAGNOSIS — Z79899 Other long term (current) drug therapy: Secondary | ICD-10-CM | POA: Diagnosis not present

## 2024-07-04 DIAGNOSIS — E1169 Type 2 diabetes mellitus with other specified complication: Secondary | ICD-10-CM

## 2024-07-04 LAB — MICROALBUMIN / CREATININE URINE RATIO
Creatinine,U: 149.9 mg/dL
Microalb Creat Ratio: 25.6 mg/g (ref 0.0–30.0)
Microalb, Ur: 3.8 mg/dL — ABNORMAL HIGH (ref 0.0–1.9)

## 2024-07-04 LAB — CBC WITH DIFFERENTIAL/PLATELET
Basophils Absolute: 0.1 K/uL (ref 0.0–0.1)
Basophils Relative: 0.7 % (ref 0.0–3.0)
Eosinophils Absolute: 0.2 K/uL (ref 0.0–0.7)
Eosinophils Relative: 2.1 % (ref 0.0–5.0)
HCT: 45.2 % (ref 39.0–52.0)
Hemoglobin: 15 g/dL (ref 13.0–17.0)
Lymphocytes Relative: 30.9 % (ref 12.0–46.0)
Lymphs Abs: 2.4 K/uL (ref 0.7–4.0)
MCHC: 33.2 g/dL (ref 30.0–36.0)
MCV: 88.3 fl (ref 78.0–100.0)
Monocytes Absolute: 0.7 K/uL (ref 0.1–1.0)
Monocytes Relative: 8.9 % (ref 3.0–12.0)
Neutro Abs: 4.5 K/uL (ref 1.4–7.7)
Neutrophils Relative %: 57.4 % (ref 43.0–77.0)
Platelets: 233 K/uL (ref 150.0–400.0)
RBC: 5.11 Mil/uL (ref 4.22–5.81)
RDW: 14.2 % (ref 11.5–15.5)
WBC: 7.8 K/uL (ref 4.0–10.5)

## 2024-07-04 LAB — COMPREHENSIVE METABOLIC PANEL WITH GFR
ALT: 21 U/L (ref 0–53)
AST: 19 U/L (ref 0–37)
Albumin: 4.9 g/dL (ref 3.5–5.2)
Alkaline Phosphatase: 55 U/L (ref 39–117)
BUN: 19 mg/dL (ref 6–23)
CO2: 28 meq/L (ref 19–32)
Calcium: 10.3 mg/dL (ref 8.4–10.5)
Chloride: 103 meq/L (ref 96–112)
Creatinine, Ser: 0.92 mg/dL (ref 0.40–1.50)
GFR: 86.83 mL/min (ref >60.00)
Glucose, Bld: 97 mg/dL (ref 70–99)
Potassium: 3.7 meq/L (ref 3.5–5.1)
Sodium: 139 meq/L (ref 135–145)
Total Bilirubin: 1.1 mg/dL (ref 0.2–1.2)
Total Protein: 7.4 g/dL (ref 6.0–8.3)

## 2024-07-04 LAB — HEMOGLOBIN A1C: Hgb A1c MFr Bld: 6.2 % (ref 4.6–6.5)

## 2024-07-04 LAB — TSH: TSH: 1.35 u[IU]/mL (ref 0.35–5.50)

## 2024-07-04 MED ORDER — ZOLPIDEM TARTRATE 10 MG PO TABS: ORAL_TABLET | ORAL | 1 refills | Status: AC

## 2024-07-04 NOTE — Patient Instructions (Addendum)
 Magnesium  Glycinate at bedtime Fish or Krill oil caps daily NOW company Multivitamin   Tinnitus Tinnitus is when you hear a sound that there's no actual source for. It may sound like ringing in your ears or something else. The sound may be loud, soft, or somewhere in between. It can last for a few seconds or be constant for days. It can come and go. Almost everyone has tinnitus at some point. It's not the same as hearing loss. But you may need to see a health care provider if: It lasts for a long time. It comes back often. You have trouble sleeping and focusing. What are the causes? The cause of tinnitus is often unknown. In some cases, you may get it if: You're around loud noises, such as from machines or music. An object gets stuck in your ear. Earwax builds up in Landscape architect. You drink a lot of alcohol or caffeine. You take certain medicines. You start to lose your hearing. You may also get it from some medical conditions. These may include: Ear or sinus infections. Heart diseases. High blood pressure. Allergies. Mnire's disease. Problems with your thyroid . A tumor. This is a growth of cells that isn't normal. A weak, bulging blood vessel called an aneurysm near your ear. What increases the risk? You may be more likely to get tinnitus if: You're around loud noises a lot. You're older. You drink alcohol. You smoke. What are the signs or symptoms? The main symptom is hearing a sound that there's no source for. It may sound like ringing. It may also sound like: Buzzing. Sizzling. Blowing air. Hissing. Whistling. Other sounds may include: Roaring. Running water. A musical note. Tapping. Humming. You may have symptoms in one ear or both ears. How is this diagnosed? Tinnitus is diagnosed based on your symptoms, your medical history, and an exam. Your provider may do a full hearing test if your tinnitus: Is in just one ear. Makes it hard for you to hear. Lasts 6 months  or longer. You may also need to see an expert in hearing disorders called an audiologist. They may ask you about your symptoms and how tinnitus affects your daily life. You may have other tests done. These may include: A CT scan. An MRI. An angiogram. This shows how blood flows through your blood vessels. How is this treated? Treatment may include: Therapy to help you manage the stress of living with tinnitus. Finding ways to mask or cover the sound of tinnitus. These include: Sound or white noise machines. Devices that fit in your ear and play sounds or music. A loud humidifier. Acoustic neural stimulation. This is when you use headphones to listen to music that has a special signal in it. Over time, this signal may change some of the pathways in your brain. This can make you less sensitive to tinnitus. This treatment is used for very severe cases. Using hearing aids or cochlear implants if your tinnitus is from hearing loss. If your tinnitus is caused by a medical condition, treating the condition may make it go away.  Follow these instructions at home: Managing symptoms     Try to avoid being in loud places or around loud noises. Wear earplugs or headphones when you're around loud noises. Find ways to reduce stress. These may include meditation, yoga, or deep breathing. Sleep with your head slightly raised. General instructions Take over-the-counter and prescription medicines only as told by your provider. Track the things that cause symptoms (triggers). Try to  avoid these things. To stop your tinnitus from getting worse: Do not drink alcohol. Do not have caffeine. Do not use any products that contain nicotine or tobacco. These products include cigarettes, chewing tobacco, and vaping devices, such as e-cigarettes. If you need help quitting, ask your provider. Avoid using too much salt. Get enough sleep each night. Where to find more information American Tinnitus Association:  https://www.johnson-hamilton.org/ Contact a health care provider if: Your symptoms last for 3 weeks or longer without stopping. You have sudden hearing loss. Your symptoms get worse or don't get better with home care. You can't manage the stress of living with tinnitus. Get help right away if: You get tinnitus after a head injury. You have tinnitus and: Dizziness. Nausea and vomiting. Loss of balance. A sudden, severe headache. Changes to your eyesight. Weakness in your face, arms, or legs. These symptoms may be an emergency. Get help right away. Call 911. Do not wait to see if the symptoms will go away. Do not drive yourself to the hospital. This information is not intended to replace advice given to you by your health care provider. Make sure you discuss any questions you have with your health care provider. Document Revised: 03/20/2023 Document Reviewed: 03/20/2023 Elsevier Patient Education  2024 [employer].

## 2024-07-05 ENCOUNTER — Encounter: Payer: Self-pay | Admitting: Family Medicine

## 2024-07-05 LAB — LIPID PANEL
Cholesterol: 127 mg/dL (ref <200)
HDL: 44 mg/dL (ref >=40)
LDL Cholesterol (Calc): 60 mg/dL
Non-HDL Cholesterol (Calc): 83 mg/dL (ref <130)
Total CHOL/HDL Ratio: 2.9 (calc) (ref <5.0)
Triglycerides: 151 mg/dL — ABNORMAL HIGH (ref <150)

## 2024-07-05 NOTE — Progress Notes (Signed)
 Called patient and no answer left vm to return call

## 2024-07-05 NOTE — Progress Notes (Signed)
 Subjective:    Patient ID: Joseph Fowler, male    DOB: February 18, 1956, 68 y.o.   MRN: [id number]  Chief Complaint  Patient presents with   Hand Pain    Middle finger on left hand is stiff in morning and can't move    Tinnitus    Ringing in both ears for the past year    foot pain    Left foot pain on one toe    HPI Discussed the use of AI scribe software for clinical note transcription with the patient, who gave verbal consent to proceed.  History of Present Illness Joseph Fowler is a 68 year old male who presents with tinnitus.  He has experienced tinnitus for approximately two years, with the past year being particularly bothersome due to constant ringing in both ears. The intensity of the ringing varies, sometimes becoming more pronounced at night. He has learned to block it out when it is not severe. No head trauma or severe illness preceded the onset of tinnitus. He denies significant hearing loss and reports that his hearing has been right on.  He has a history of using fish oil supplements but has not been taking them recently. He ensures adequate hydration, carrying a 48-ounce bottle and refilling it throughout the day.  He uses Ambien  for sleep on an as-needed basis, with a 30-day supply lasting approximately 120 days. He finds it effective when used infrequently, helping him sleep well without next-day drowsiness.  He experiences stiffness and locking in his fingers, particularly the middle finger, which has been occurring for the past month. He wakes up with the finger locked and experiences pain until it loosens up with movement. He takes 400 mg of magnesium  glycinate nightly, which has helped with muscle cramps in the past.  He reports a sharp, intermittent pain in the second toe of his left foot, described as a 'needle-like' sensation, occurring mostly in the morning or at night. He has diabetes and is concerned about potential nerve involvement, although the pain is  localized to one toe.  He has a history of back injury from his twenties, resulting in morning stiffness and pain that improves with stretching exercises. He maintains a routine of back stretches to manage the discomfort.    Past Medical History:  Diagnosis Date   Allergy    Arthritis 09/06/2013   Blood in stool 12/03/2012   BPH (benign prostatic hyperplasia) 09/12/2012   CAD (coronary artery disease)    s/p CABG in 06/2022   Dermatitis 09/12/2012   Diabetes mellitus without complication [facility])    ED (erectile dysfunction) 09/12/2012   Hyperlipidemia    Hypertension 68 yrs old   Nocturia 09/12/2012   Obesity 09/12/2012   Overweight(278.02) 09/12/2012   PONV (postoperative nausea and vomiting)    Has nausea   Prostate cancer (HCC)    Snoring 09/12/2012    Past Surgical History:  Procedure Laterality Date   APPENDECTOMY  68 yrs old   EYE SURGERY  12/27/2003   ,right, injury, decreased visual acuity, photophobia   HERNIA REPAIR     INGUINAL HERNIA REPAIR Right 03/01/2024   Procedure: RIGHT OPEN INGUINAL HERNIA REPAIR WITH MESH;  Surgeon: Kinsinger, Herlene Righter, MD;  Location: [facility];  Service: General;  Laterality: Right;  LMA & TAP BLOCK   LEFT HEART CATH AND CORONARY ANGIOGRAPHY N/A 07/07/2022   Procedure: LEFT HEART CATH AND CORONARY ANGIOGRAPHY;  Surgeon: Verlin Lonni BIRCH, MD;  Location: [facility];  Service: Cardiovascular;  Laterality: N/A;   LYMPHADENECTOMY Bilateral 10/15/2020   Procedure: LYMPHADENECTOMY, PELVIC;  Surgeon: Renda Glance, MD;  Location: [facility];  Service: Urology;  Laterality: Bilateral;   PROSTATE BIOPSY     ROBOT ASSISTED LAPAROSCOPIC RADICAL PROSTATECTOMY N/A 10/15/2020   Procedure: XI ROBOTIC ASSISTED LAPAROSCOPIC RADICAL PROSTATECTOMY LEVEL 2;  Surgeon: Renda Glance, MD;  Location: [facility];  Service: Urology;  Laterality: N/A;   torn meninscus  12/26/2009   knee- right   VASECTOMY  68 yrs old    Family History  Problem Relation Age  of Onset   Dementia Mother    Diabetes Father        type 2   Other Brother        whole in heart   Heart disease Brother        sudden cardiac death   Arthritis Paternal Uncle    Stroke Maternal Grandmother        X 2   Colon cancer Neg Hx     Social History   Socioeconomic History   Marital status: Married    Spouse name: Not on file   Number of children: 6   Years of education: Not on file   Highest education level: Associate degree: occupational, Scientist, product/process development, or vocational program  Occupational History   Occupation: Retired - Actor  Tobacco Use   Smoking status: Former    Current packs/day: 0.00    Average packs/day: 2.0 packs/day for 4.0 years (8.0 ttl pk-yrs)    Types: Cigarettes    Start date: 12/26/1977    Quit date: 12/26/1981    Years since quitting: 42.5   Smokeless tobacco: Never  Vaping Use   Vaping status: Never Used  Substance and Sexual Activity   Alcohol use: Yes    Comment: Very rare   Drug use: No   Sexual activity: Yes  Other Topics Concern   Not on file  Social History Narrative   Not on file   Social Drivers of Health   Financial Resource Strain: Low Risk  (07/03/2024)   Overall Financial Resource Strain (CARDIA)    Difficulty of Paying Living Expenses: Not hard at all  Food Insecurity: No Food Insecurity (07/03/2024)   Hunger Vital Sign    Worried About Running Out of Food in the Last Year: Never true    Ran Out of Food in the Last Year: Never true  Transportation Needs: No Transportation Needs (07/03/2024)   PRAPARE - Administrator, Civil Service (Medical): No    Lack of Transportation (Non-Medical): No  Physical Activity: Sufficiently Active (07/03/2024)   Exercise Vital Sign    Days of Exercise per Week: 7 days    Minutes of Exercise per Session: 30 min  Stress: No Stress Concern Present (07/03/2024)   [employer] of Occupational Health - Occupational Stress Questionnaire    Feeling of Stress: Not at all  Social  Connections: Moderately Integrated (07/03/2024)   Social Connection and Isolation Panel    Frequency of Communication with Friends and Family: More than three times a week    Frequency of Social Gatherings with Friends and Family: Twice a week    Attends Religious Services: Never    Database administrator or Organizations: Yes    Attends Banker Meetings: Never    Marital Status: Married  Catering manager Violence: Not on file    Outpatient Medications Prior to Visit  Medication Sig Dispense Refill   amLODipine  (  NORVASC ) 5 MG tablet TAKE 1 TABLET IN THE MORNING AND AT BEDTIME 180 tablet 3   Ascorbic Acid (VITAMIN C) 1000 MG tablet Take 1,000 mg by mouth daily.     aspirin  EC 81 MG tablet Take 1 tablet (81 mg total) by mouth daily. Swallow whole. 90 tablet 3   atorvastatin  (LIPITOR) 40 MG tablet Take 1 tablet (40 mg total) by mouth daily. 90 tablet 3   benzonatate  (TESSALON ) 200 MG capsule Take 1 capsule (200 mg total) by mouth 2 (two) times daily as needed for cough. 20 capsule 0   blood glucose meter kit and supplies Dispense based on patient and insurance preference. Use up to four times daily as directed. (FOR ICD-10 E10.9, E11.9). 1 each 0   Blood Glucose Monitoring Suppl (ONETOUCH VERIO) w/Device KIT Use up to four times daily as directed. 1 kit 1   Calcium  Polycarbophil (FIBER-CAPS PO) Take 2 capsules by mouth daily.     Cholecalciferol (VITAMIN D) 50 MCG (2000 UT) tablet Take 2,000 Units by mouth daily.     fluticasone  (FLONASE ) 50 MCG/ACT nasal spray Place 2 sprays into both nostrils daily. (Patient taking differently: Place 2 sprays into both nostrils at bedtime as needed for allergies.) 16 g 1   GEMTESA 75 MG TABS Take 75 mg by mouth daily.     glucose blood (ONETOUCH VERIO) test strip Use as instructed 100 each 12   ibuprofen  (ADVIL ) 800 MG tablet Take 1 tablet (800 mg total) by mouth every 8 (eight) hours as needed. 30 tablet 0   loratadine  (CLARITIN ) 10 MG tablet  Take 1 tablet (10 mg total) by mouth daily. 90 tablet 1   magnesium  oxide (MAG-OX) 400 (240 Mg) MG tablet Take 400 mg by mouth at bedtime.     metFORMIN  (GLUCOPHAGE ) 500 MG tablet TAKE 2 TABLETS TWICE DAILY WITH MEALS (NEED MD APPOINTMENT) 336 tablet 1   metoprolol  tartrate (LOPRESSOR ) 25 MG tablet TAKE 1 AND 1/2 TABLETS TWICE DAILY (NEED MD APPOINTMENT) 252 tablet 1   Multiple Vitamins-Minerals (MULTIVITAMIN WITH MINERALS) tablet Take 1 tablet by mouth daily.     nitroGLYCERIN  (NITROSTAT ) 0.4 MG SL tablet Place 1 tablet (0.4 mg total) under the tongue every 5 (five) minutes as needed for chest pain. 50 tablet 3   OneTouch Delica Lancets 33G MISC Use as instructed 100 each 11   oxyCODONE  (OXY IR/ROXICODONE ) 5 MG immediate release tablet Take 1 tablet (5 mg total) by mouth every 6 (six) hours as needed for severe pain (pain score 7-10). 15 tablet 0   sildenafil  (VIAGRA ) 100 MG tablet TAKE 1 TABLET BY MOUTH ONCE DAILY AS NEEDED FOR ERECTILE DYSFUNCTION (Patient taking differently: Take 100 mg by mouth as needed for erectile dysfunction.) 4 tablet 0   zolpidem  (AMBIEN ) 10 MG tablet TAKE 1/2 TO 1 (ONE-HALF TO ONE) TABLET BY MOUTH AT BEDTIME AS NEEDED FOR SLEEP 30 tablet 1   No facility-administered medications prior to visit.    No Known Allergies  Review of Systems  Constitutional:  Negative for fever and malaise/fatigue.  HENT:  Positive for tinnitus. Negative for congestion.   Eyes:  Negative for blurred vision.  Respiratory:  Negative for shortness of breath.   Cardiovascular:  Negative for chest pain, palpitations and leg swelling.  Gastrointestinal:  Negative for abdominal pain, blood in stool and nausea.  Genitourinary:  Negative for dysuria and frequency.  Musculoskeletal:  Positive for back pain and joint pain. Negative for falls.  Skin:  Negative for  rash.  Neurological:  Negative for dizziness, loss of consciousness and headaches.  Endo/Heme/Allergies:  Negative for environmental  allergies.  Psychiatric/Behavioral:  Negative for depression. The patient has insomnia. The patient is not nervous/anxious.        Objective:    Physical Exam Vitals reviewed.  Constitutional:      Appearance: Normal appearance. He is not ill-appearing.  HENT:     Head: Normocephalic and atraumatic.     Nose: Nose normal.  Eyes:     Conjunctiva/sclera: Conjunctivae normal.  Cardiovascular:     Rate and Rhythm: Normal rate.     Pulses: Normal pulses.     Heart sounds: Normal heart sounds. No murmur heard. Pulmonary:     Effort: Pulmonary effort is normal.     Breath sounds: Normal breath sounds. No wheezing.  Abdominal:     Palpations: Abdomen is soft. There is no mass.     Tenderness: There is no abdominal tenderness.  Musculoskeletal:     Cervical back: Normal range of motion.     Right lower leg: No edema.     Left lower leg: No edema.  Skin:    General: Skin is warm and dry.  Neurological:     General: No focal deficit present.     Mental Status: He is alert and oriented to person, place, and time.  Psychiatric:        Mood and Affect: Mood normal.     BP 130/80 (BP Location: Right Arm, Patient Position: Sitting, Cuff Size: Normal)   Pulse 61   Ht 6' 2 (1.88 m)   Wt 222 lb 12.8 oz (101.1 kg)   SpO2 94%   BMI 28.61 kg/m  Wt Readings from Last 3 Encounters:  07/04/24 222 lb 12.8 oz (101.1 kg)  03/01/24 220 lb (99.8 kg)  02/13/24 220 lb (99.8 kg)    Diabetic Foot Exam - Simple   No data filed    Lab Results  Component Value Date   WBC 7.8 07/04/2024   HGB 15.0 07/04/2024   HCT 45.2 07/04/2024   PLT 233.0 07/04/2024   GLUCOSE 97 07/04/2024   CHOL 127 07/04/2024   TRIG 151 (H) 07/04/2024   HDL 44 07/04/2024   LDLDIRECT 74.0 05/26/2022   LDLCALC 60 07/04/2024   ALT 21 07/04/2024   AST 19 07/04/2024   NA 139 07/04/2024   K 3.7 07/04/2024   CL 103 07/04/2024   CREATININE 0.92 07/04/2024   BUN 19 07/04/2024   CO2 28 07/04/2024   TSH 1.35  07/04/2024   PSA 6.36 06/18/2020   HGBA1C 6.2 07/04/2024   MICROALBUR 3.8 (H) 07/04/2024    Lab Results  Component Value Date   TSH 1.35 07/04/2024   Lab Results  Component Value Date   WBC 7.8 07/04/2024   HGB 15.0 07/04/2024   HCT 45.2 07/04/2024   MCV 88.3 07/04/2024   PLT 233.0 07/04/2024   Lab Results  Component Value Date   NA 139 07/04/2024   K 3.7 07/04/2024   CO2 28 07/04/2024   GLUCOSE 97 07/04/2024   BUN 19 07/04/2024   CREATININE 0.92 07/04/2024   BILITOT 1.1 07/04/2024   ALKPHOS 55 07/04/2024   AST 19 07/04/2024   ALT 21 07/04/2024   PROT 7.4 07/04/2024   ALBUMIN 4.9 07/04/2024   CALCIUM  10.3 07/04/2024   ANIONGAP 12 02/13/2024   EGFR 88 06/23/2022   GFR 86.83 07/04/2024   Lab Results  Component Value Date   CHOL  127 07/04/2024   Lab Results  Component Value Date   HDL 44 07/04/2024   Lab Results  Component Value Date   LDLCALC 60 07/04/2024   Lab Results  Component Value Date   TRIG 151 (H) 07/04/2024   Lab Results  Component Value Date   CHOLHDL 2.9 07/04/2024   Lab Results  Component Value Date   HGBA1C 6.2 07/04/2024       Assessment & Plan:  Cancer of prostate w/low recurrence risk (T1-2a, Gleason<7 & PSA<10) (HCC) Assessment & Plan: Continues to follow with urology.    DM type 2 with diabetic mixed hyperlipidemia (HCC) Assessment & Plan: hgba1c acceptable, minimize simple carbs. Increase exercise as tolerated. Continue current meds  Orders: -     Microalbumin / creatinine urine ratio -     Hemoglobin A1c  Hyperlipidemia, mixed Assessment & Plan: Encourage heart healthy diet such as MIND or DASH diet, increase exercise, avoid trans fats, simple carbohydrates and processed foods, consider a krill or fish or flaxseed oil cap daily. Tolerating Lipitor  Orders: -     Lipid panel  Primary hypertension Assessment & Plan: Mildly elevated but patient very stressed with running around today, no changes to meds. Encouraged  heart healthy diet such as the DASH diet and exercise as tolerated. He is encouraged to monitor his resting BP at home and report them to us  so we can adjust meds if they remain elevated at home  Orders: -     Comprehensive metabolic panel with GFR -     CBC with Differential/Platelet -     TSH  Insomnia, unspecified type Assessment & Plan: Encouraged good sleep hygiene such as dark, quiet room. No blue/green glowing lights such as computer screens in bedroom. No alcohol or stimulants in evening. Cut down on caffeine as able. Regular exercise is helpful but not just prior to bed time.  He is using Ambien  prn and does not need it most days  Orders: -     Drug Monitoring Panel [phone] , Urine  Tinnitus, unspecified laterality  Hearing loss, unspecified hearing loss type, unspecified laterality -     Ambulatory referral to Audiology  Other orders -     Zolpidem  Tartrate; TAKE 1/2 TO 1 (ONE-HALF TO ONE) TABLET BY MOUTH AT BEDTIME AS NEEDED FOR SLEEP  Dispense: 30 tablet; Refill: 1    Assessment and Plan Assessment & Plan Tinnitus Tinnitus for 1-2 years, recently worsened. No head trauma or fever. No significant hearing loss, but baseline hearing evaluation recommended. Emphasized hydration and fish oil for nerve health. - Start fish oil supplementation. - Ensure 80 ounces of water daily. - Refer to [facility] for hearing evaluation.  Osteoarthritis of the hand Recent stiffness and locking in right middle finger, improving with movement. Classic osteoarthritis presentation. Discussed hand exercises to slow progression. - Continue magnesium  400 mg nightly. - Encourage regular hand exercises. - Consider hand specialist referral if worsens.  Intermittent toe pain Intermittent sharp pain in left second toe, likely nerve involvement. Diabetic neuropathy unlikely. Discussed nerve compression and health maintenance. - Reinforce fish oil, multivitamins, and hydration. - Consider sports  medicine consultation if persistent.  Insomnia Intermittent zolpidem  use effective. Discussed necessity of regular follow-up for controlled substances. - Refill zolpidem  with 30 tablets and one refill. - Emphasize six-month review policy for controlled substances.  General Health Maintenance Emphasized hydration, multivitamins, and fish oil for health. Highlighted fiber's role in preventing bowel disease. - Encourage reputable multivitamin use, e.g., NOW. -  Continue regular fiber intake. - Order routine blood work for glucose, kidney function, and cholesterol.     Harlene Horton, MD

## 2024-07-06 LAB — DRUG MONITORING PANEL 376104, URINE
Amphetamines: NEGATIVE ng/mL (ref <500)
Barbiturates: NEGATIVE ng/mL (ref <300)
Benzodiazepines: NEGATIVE ng/mL (ref <100)
Cocaine Metabolite: NEGATIVE ng/mL (ref <150)
Desmethyltramadol: NEGATIVE ng/mL (ref <100)
Opiates: NEGATIVE ng/mL (ref <100)
Oxycodone: NEGATIVE ng/mL (ref <100)
Tramadol: NEGATIVE ng/mL (ref <100)

## 2024-07-06 LAB — DM TEMPLATE

## 2024-07-10 ENCOUNTER — Ambulatory Visit: Payer: Medicare HMO | Admitting: Dermatology

## 2024-08-02 DIAGNOSIS — H9313 Tinnitus, bilateral: Secondary | ICD-10-CM | POA: Diagnosis not present

## 2024-08-02 DIAGNOSIS — H903 Sensorineural hearing loss, bilateral: Secondary | ICD-10-CM | POA: Diagnosis not present

## 2024-08-30 ENCOUNTER — Encounter: Payer: Self-pay | Admitting: Family Medicine

## 2024-09-16 ENCOUNTER — Ambulatory Visit: Admitting: Dermatology

## 2024-09-16 ENCOUNTER — Encounter: Payer: Self-pay | Admitting: Dermatology

## 2024-09-16 VITALS — BP 160/99 | HR 71

## 2024-09-16 DIAGNOSIS — Z86007 Personal history of in-situ neoplasm of skin: Secondary | ICD-10-CM

## 2024-09-16 DIAGNOSIS — L570 Actinic keratosis: Secondary | ICD-10-CM

## 2024-09-16 DIAGNOSIS — D225 Melanocytic nevi of trunk: Secondary | ICD-10-CM | POA: Diagnosis not present

## 2024-09-16 DIAGNOSIS — L578 Other skin changes due to chronic exposure to nonionizing radiation: Secondary | ICD-10-CM | POA: Diagnosis not present

## 2024-09-16 DIAGNOSIS — Z85828 Personal history of other malignant neoplasm of skin: Secondary | ICD-10-CM

## 2024-09-16 DIAGNOSIS — C4491 Basal cell carcinoma of skin, unspecified: Secondary | ICD-10-CM

## 2024-09-16 DIAGNOSIS — D1801 Hemangioma of skin and subcutaneous tissue: Secondary | ICD-10-CM

## 2024-09-16 DIAGNOSIS — L821 Other seborrheic keratosis: Secondary | ICD-10-CM | POA: Diagnosis not present

## 2024-09-16 DIAGNOSIS — D485 Neoplasm of uncertain behavior of skin: Secondary | ICD-10-CM

## 2024-09-16 DIAGNOSIS — L814 Other melanin hyperpigmentation: Secondary | ICD-10-CM

## 2024-09-16 DIAGNOSIS — D099 Carcinoma in situ, unspecified: Secondary | ICD-10-CM

## 2024-09-16 DIAGNOSIS — D229 Melanocytic nevi, unspecified: Secondary | ICD-10-CM

## 2024-09-16 DIAGNOSIS — W908XXA Exposure to other nonionizing radiation, initial encounter: Secondary | ICD-10-CM

## 2024-09-16 DIAGNOSIS — Z1283 Encounter for screening for malignant neoplasm of skin: Secondary | ICD-10-CM

## 2024-09-16 DIAGNOSIS — D235 Other benign neoplasm of skin of trunk: Secondary | ICD-10-CM | POA: Diagnosis not present

## 2024-09-16 NOTE — Progress Notes (Signed)
 Follow-Up Visit   Subjective  Joseph Fowler is a 68 y.o. male who presents for the following: Skin Cancer Screening and Full Body Skin Exam  The patient presents for Total-Body Skin Exam (TBSE) for skin cancer screening and mole check. The patient has spots, moles and lesions to be evaluated, some may be new or changing.  History of BCC, SCC, Aks  The following portions of the chart were reviewed this encounter and updated as appropriate: medications, allergies, medical history  Review of Systems:  No other skin or systemic complaints except as noted in HPI or Assessment and Plan.  Objective  Well appearing patient in no apparent distress; mood and affect are within normal limits.  A full examination was performed including scalp, head, eyes, ears, nose, lips, neck, chest, axillae, abdomen, back, buttocks, bilateral upper extremities, bilateral lower extremities, hands, feet, fingers, toes, fingernails, and toenails. All findings within normal limits unless otherwise noted below.   Relevant physical exam findings are noted in the Assessment and Plan.  Head - Anterior (Face) (2), Left Forearm - Posterior, Left Superior Helix (2) Erythematous thin papules/macules with gritty scale.  Left Lower Back 5mm irregularly shaped papule   Assessment & Plan   SKIN CANCER SCREENING PERFORMED TODAY.  ACTINIC DAMAGE - Chronic condition, secondary to cumulative UV/sun exposure - diffuse scaly erythematous macules with underlying dyspigmentation - Recommend daily broad spectrum sunscreen SPF 30+ to sun-exposed areas, reapply every 2 hours as needed.  - Staying in the shade or wearing long sleeves, sun glasses (UVA+UVB protection) and wide brim hats (4-inch brim around the entire circumference of the hat) are also recommended for sun protection.  - Call for new or changing lesions.  LENTIGINES, SEBORRHEIC KERATOSES, HEMANGIOMAS - Benign normal skin lesions - Benign-appearing - Call for any  changes  MELANOCYTIC NEVI - Tan-brown and/or pink-flesh-colored symmetric macules and papules - Benign appearing on exam today - Observation - Call clinic for new or changing moles - Recommend daily use of broad spectrum spf 30+ sunscreen to sun-exposed areas.   HISTORY OF BASAL CELL CARCINOMA OF THE SKIN - No evidence of recurrence today - Recommend regular full body skin exams - Recommend daily broad spectrum sunscreen SPF 30+ to sun-exposed areas, reapply every 2 hours as needed.  - Call if any new or changing lesions are noted between office visits  HISTORY OF SQUAMOUS CELL CARCINOMA IN SITU OF THE SKIN - No evidence of recurrence today - Recommend regular full body skin exams - Recommend daily broad spectrum sunscreen SPF 30+ to sun-exposed areas, reapply every 2 hours as needed.  - Call if any new or changing lesions are noted between office visits  AK (ACTINIC KERATOSIS) (5) Head - Anterior (Face) (2), Left Forearm - Posterior, Left Superior Helix (2) Destruction of lesion - Head - Anterior (Face) (2), Left Forearm - Posterior, Left Superior Helix (2) Complexity: simple   Destruction method: cryotherapy   Informed consent: discussed and consent obtained   Timeout:  patient name, date of birth, surgical site, and procedure verified Lesion destroyed using liquid nitrogen: Yes   Region frozen until ice ball extended beyond lesion: Yes   Outcome: patient tolerated procedure well with no complications   Post-procedure details: wound care instructions given    NEOPLASM OF UNCERTAIN BEHAVIOR OF SKIN Left Lower Back Skin / nail biopsy Type of biopsy: tangential   Informed consent: discussed and consent obtained   Timeout: patient name, date of birth, surgical site, and procedure verified  Procedure prep:  Patient was prepped and draped in usual sterile fashion Prep type:  Chlorhexidine  Anesthesia: the lesion was anesthetized in a standard fashion   Anesthetic:  1% lidocaine   w/ epinephrine  1-100,000 buffered w/ 8.4% NaHCO3 Instrument used: DermaBlade   Hemostasis achieved with: aluminum chloride   Outcome: patient tolerated procedure well   Post-procedure details: sterile dressing applied and wound care instructions given   Dressing type: bandage and petrolatum    Specimen 1 - Surgical pathology Differential Diagnosis: r/o NMSC vs other  Check Margins: No ACTINIC SKIN DAMAGE   SEBORRHEIC KERATOSIS   BENIGN NEVUS   CHERRY ANGIOMA   LENTIGINES   BASAL CELL CARCINOMA (BCC), UNSPECIFIED SITE   SQUAMOUS CELL CARCINOMA IN SITU (SCCIS)   Return in about 6 months (around 03/16/2025) for FBSE.  LILLETTE Rollene Gobble, RN, am acting as scribe for RUFUS CHRISTELLA HOLY, MD .   Documentation: I have reviewed the above documentation for accuracy and completeness, and I agree with the above.  RUFUS CHRISTELLA HOLY, MD

## 2024-09-16 NOTE — Patient Instructions (Addendum)

## 2024-09-18 LAB — SURGICAL PATHOLOGY

## 2024-09-19 ENCOUNTER — Ambulatory Visit: Payer: Self-pay | Admitting: Dermatology

## 2024-10-07 DIAGNOSIS — R399 Unspecified symptoms and signs involving the genitourinary system: Secondary | ICD-10-CM | POA: Diagnosis not present

## 2024-10-07 DIAGNOSIS — N3281 Overactive bladder: Secondary | ICD-10-CM | POA: Diagnosis not present

## 2024-10-07 DIAGNOSIS — E113292 Type 2 diabetes mellitus with mild nonproliferative diabetic retinopathy without macular edema, left eye: Secondary | ICD-10-CM | POA: Diagnosis not present

## 2024-10-07 DIAGNOSIS — S058X1S Other injuries of right eye and orbit, sequela: Secondary | ICD-10-CM | POA: Diagnosis not present

## 2024-10-07 DIAGNOSIS — C61 Malignant neoplasm of prostate: Secondary | ICD-10-CM | POA: Diagnosis not present

## 2024-10-07 DIAGNOSIS — Z7984 Long term (current) use of oral hypoglycemic drugs: Secondary | ICD-10-CM | POA: Diagnosis not present

## 2024-10-07 DIAGNOSIS — N5201 Erectile dysfunction due to arterial insufficiency: Secondary | ICD-10-CM | POA: Diagnosis not present

## 2024-10-07 LAB — HM DIABETES EYE EXAM

## 2024-10-08 ENCOUNTER — Encounter: Payer: Self-pay | Admitting: Family Medicine

## 2024-10-13 ENCOUNTER — Other Ambulatory Visit: Payer: Self-pay | Admitting: Family Medicine

## 2024-11-22 DIAGNOSIS — R21 Rash and other nonspecific skin eruption: Secondary | ICD-10-CM | POA: Diagnosis not present

## 2024-11-22 DIAGNOSIS — W57XXXA Bitten or stung by nonvenomous insect and other nonvenomous arthropods, initial encounter: Secondary | ICD-10-CM | POA: Diagnosis not present

## 2024-12-12 ENCOUNTER — Telehealth: Payer: Self-pay | Admitting: Family Medicine

## 2024-12-12 NOTE — Telephone Encounter (Signed)
 Copied from CRM #[id number]. Topic: Medicare AWV >> Dec 12, 2024 10:37 AM Nathanel DEL wrote: Called LVM 12/12/2024 to sched AWVI. Please schedule AWVI in office only.   Nathanel Paschal; Care Guide Ambulatory Clinical Support [place] [facility] Direct Dial: [phone]

## 2024-12-17 ENCOUNTER — Other Ambulatory Visit: Payer: Self-pay | Admitting: Cardiovascular Disease

## 2024-12-17 MED ORDER — ATORVASTATIN CALCIUM 40 MG PO TABS: 40.0000 mg | ORAL_TABLET | Freq: Every day | ORAL | 1 refills | Status: AC

## 2025-01-01 ENCOUNTER — Other Ambulatory Visit: Payer: Self-pay | Admitting: Family Medicine

## 2025-01-01 DIAGNOSIS — I10 Essential (primary) hypertension: Secondary | ICD-10-CM

## 2025-01-02 ENCOUNTER — Encounter: Payer: Self-pay | Admitting: Cardiovascular Disease

## 2025-01-07 ENCOUNTER — Ambulatory Visit (INDEPENDENT_AMBULATORY_CARE_PROVIDER_SITE_OTHER): Admitting: *Deleted

## 2025-01-07 VITALS — Ht 74.0 in | Wt 222.0 lb

## 2025-01-07 DIAGNOSIS — Z0000 Encounter for general adult medical examination without abnormal findings: Secondary | ICD-10-CM | POA: Diagnosis not present

## 2025-01-07 DIAGNOSIS — Z1211 Encounter for screening for malignant neoplasm of colon: Secondary | ICD-10-CM

## 2025-01-07 NOTE — Patient Instructions (Addendum)
 Joseph Fowler,  Thank you for taking the time for your Medicare Wellness Visit. I appreciate your continued commitment to your health goals. Please review the care plan we discussed, and feel free to reach out if I can assist you further.  Please note that Annual Wellness Visits do not include a physical exam. Some assessments may be limited, especially if the visit was conducted virtually. If needed, we may recommend an in-person follow-up with your provider.  Goal: to start going to the [facility] 4 days a week   Ongoing Care Seeing your primary care provider every 3 to 6 months helps us  monitor your health and provide consistent, personalized care.   Dr Domenica: 02/24/25 10am Medicare AWV: 01/08/26 9:40am, telephone   Referrals If a referral was made during today's visit and you haven't received any updates within two weeks, please contact the referred provider directly to check on the status.  Cologuard:  The order was placed today. Please let us  know if you haven't received this kit in the mail within in a couple of weeks.  Recommended Screenings:  You will need to get the following vaccines at your local pharmacy: 2nd Shingles, flu and pneumonia booster  Health Maintenance  Topic Date Due   Medicare Annual Wellness Visit  Never done   Complete foot exam   Never done   Pneumococcal Vaccine for age over 39 (2 of 2 - PPSV23, PCV20, or PCV21) 02/14/2014   Cologuard (Stool DNA test)  03/05/2020   Flu Shot  07/26/2024   Zoster (Shingles) Vaccine (2 of 2) 10/25/2024   Hemoglobin A1C  01/04/2025   Yearly kidney function blood test for diabetes  07/04/2025   Yearly kidney health urinalysis for diabetes  07/04/2025   Eye exam for diabetics  10/07/2025   DTaP/Tdap/Td vaccine (5 - Td or Tdap) 11/10/2031   Hepatitis C Screening  Completed   Meningitis B Vaccine  Aged Out   Breast Cancer Screening  Discontinued   COVID-19 Vaccine  Discontinued       01/07/2025    9:54 AM  Advanced Directives   Does Patient Have a Medical Advance Directive? No  Would patient like information on creating a medical advance directive? No - Patient declined  Once completed and notarized, you may return a copy of your Advanced Directive(s) by either of the following: Bring a copy of your health care power of attorney and living will to the office to be added to your chart at your convenience. You can mail a copy to [facility] [address] [place], [place] [address] or email to [email or web address]   Vision: Annual vision screenings are recommended for early detection of glaucoma, cataracts, and diabetic retinopathy. These exams can also reveal signs of chronic conditions such as diabetes and high blood pressure.  Dental: Annual dental screenings help detect early signs of oral cancer, gum disease, and other conditions linked to overall health, including heart disease and diabetes.  Please see the attached documents for additional preventive care recommendations.

## 2025-01-07 NOTE — Progress Notes (Signed)
 "  Please attest this visit in the absence of patient primary care provider.   Chief Complaint  Patient presents with   Medicare Wellness     Subjective:   Joseph Fowler is a 69 y.o. male who presents for a Medicare Annual Wellness Visit.  Visit info / Clinical Intake: Medicare Wellness Visit Type:: Initial Annual Wellness Visit Persons participating in visit and providing information:: patient Medicare Wellness Visit Mode:: Telephone If telephone:: video declined Since this visit was completed virtually, some vitals may be partially provided or unavailable. Missing vitals are due to the limitations of the virtual format.: Unable to obtain vitals - no equipment If Telephone or Video please confirm:: I connected with patient using audio/video enable telemedicine. I verified patient identity with two identifiers, discussed telehealth limitations, and patient agreed to proceed. Patient Location:: home Provider Location:: office Interpreter Needed?: No Pre-visit prep was completed: yes AWV questionnaire completed by patient prior to visit?: no Living arrangements:: lives with spouse/significant other (husky and 2 cats) Patient's Overall Health Status Rating: good Typical amount of pain: some (knee and back. stretches help and wears brace on right knee) Does pain affect daily life?: no Are you currently prescribed opioids?: no  Dietary Habits and Nutritional Risks How many meals a day?: 3 Eats fruit and vegetables daily?: yes Most meals are obtained by: preparing own meals In the last 2 weeks, have you had any of the following?: none Diabetic:: (!) yes Any non-healing wounds?: no How often do you check your BS?: as needed Would you like to be referred to a Nutritionist or for Diabetic Management? : no  Functional Status Activities of Daily Living (to include ambulation/medication): Independent Ambulation: Independent Medication Administration: Independent Home Management  (perform basic housework or laundry): Independent Manage your own finances?: yes Primary transportation is: driving Concerns about vision?: no *vision screening is required for [facility]* (up to date with [facility]) Concerns about hearing?: (!) yes (has bilateral tinnitus. Had hearing test, low frequency hearing loss and will recheck in 1 yr) Uses hearing aids?: no  Fall Screening Falls in the past year?: 0 Number of falls in past year: 0 Was there an injury with Fall?: 0 Fall Risk Category Calculator: 0 Patient Fall Risk Level: Low Fall Risk  Fall Risk Patient at Risk for Falls Due to: Orthopedic patient Fall risk Follow up: Falls evaluation completed  Home and Transportation Safety: All rugs have non-skid backing?: yes All stairs or steps have railings?: yes Grab bars in the bathtub or shower?: (!) no (walk in shower) Have non-skid surface in bathtub or shower?: yes Good home lighting?: yes Regular seat belt use?: yes Hospital stays in the last year:: no  Cognitive Assessment Difficulty concentrating, remembering, or making decisions? : no Will 6CIT or Mini Cog be Completed: yes What year is it?: 0 points What month is it?: 0 points Give patient an address phrase to remember (5 components): [address], [employer] [place]  About what time is it?: 0 points Count backwards from 20 to 1: 0 points Say the months of the year in reverse: 0 points Repeat the address phrase from earlier: 2 points 6 CIT Score: 2 points  Advance Directives (For Healthcare) Does Patient Have a Medical Advance Directive?: No (Pt is working on getting these in place) Would patient like information on creating a medical advance directive?: No - Patient declined  Reviewed/Updated  Reviewed/Updated: Reviewed All (Medical, Surgical, Family, Medications, Allergies, Care Teams, Patient Goals)    Allergies (  verified) Patient has no known allergies.   Current Medications  (verified) Outpatient Encounter Medications as of 01/07/2025  Medication Sig   amLODipine  (NORVASC ) 5 MG tablet TAKE 1 TABLET IN THE MORNING AND AT BEDTIME   Ascorbic Acid (VITAMIN C) 1000 MG tablet Take 1,000 mg by mouth daily.   atorvastatin  (LIPITOR) 40 MG tablet Take 1 tablet (40 mg total) by mouth daily.   benzonatate  (TESSALON ) 200 MG capsule Take 1 capsule (200 mg total) by mouth 2 (two) times daily as needed for cough.   blood glucose meter kit and supplies Dispense based on patient and insurance preference. Use up to four times daily as directed. (FOR ICD-10 E10.9, E11.9).   Blood Glucose Monitoring Suppl (ONETOUCH VERIO) w/Device KIT Use up to four times daily as directed.   Calcium  Polycarbophil (FIBER-CAPS PO) Take 2 capsules by mouth daily.   Cholecalciferol (VITAMIN D) 50 MCG (2000 UT) tablet Take 2,000 Units by mouth daily.   fluticasone  (FLONASE ) 50 MCG/ACT nasal spray Place 2 sprays into both nostrils daily.   glucose blood (ONETOUCH VERIO) test strip Use as instructed   ibuprofen  (ADVIL ) 800 MG tablet Take 1 tablet (800 mg total) by mouth every 8 (eight) hours as needed.   loratadine  (CLARITIN ) 10 MG tablet Take 1 tablet (10 mg total) by mouth daily.   magnesium  oxide (MAG-OX) 400 (240 Mg) MG tablet Take 400 mg by mouth at bedtime.   metFORMIN  (GLUCOPHAGE ) 500 MG tablet TAKE 2 TABLETS TWICE DAILY WITH MEALS   metoprolol  tartrate (LOPRESSOR ) 25 MG tablet TAKE 1 AND 1/2 TABLETS TWICE DAILY   Multiple Vitamins-Minerals (MULTIVITAMIN WITH MINERALS) tablet Take 1 tablet by mouth daily.   MYRBETRIQ 50 MG TB24 tablet Take 50 mg by mouth daily.   nitroGLYCERIN  (NITROSTAT ) 0.4 MG SL tablet Place 1 tablet (0.4 mg total) under the tongue every 5 (five) minutes as needed for chest pain.   OneTouch Delica Lancets 33G MISC Use as instructed   sildenafil  (VIAGRA ) 100 MG tablet TAKE 1 TABLET BY MOUTH ONCE DAILY AS NEEDED FOR ERECTILE DYSFUNCTION   zolpidem  (AMBIEN ) 10 MG tablet TAKE 1/2 TO 1  (ONE-HALF TO ONE) TABLET BY MOUTH AT BEDTIME AS NEEDED FOR SLEEP   aspirin  EC 81 MG tablet Take 1 tablet (81 mg total) by mouth daily. Swallow whole. (Patient not taking: Reported on 01/07/2025)   [DISCONTINUED] GEMTESA 75 MG TABS Take 75 mg by mouth daily. (Patient not taking: Reported on 09/16/2024)   No facility-administered encounter medications on file as of 01/07/2025.    History: Past Medical History:  Diagnosis Date   Allergy    Arthritis 09/06/2013   Basal cell carcinoma of skin 08/2023   left neck   Blood in stool 12/03/2012   BPH (benign prostatic hyperplasia) 09/12/2012   CAD (coronary artery disease)    s/p CABG in 06/2022   Dermatitis 09/12/2012   Diabetes mellitus without complication [facility])    ED (erectile dysfunction) 09/12/2012   Hyperlipidemia    Hypertension 69 yrs old   Nocturia 09/12/2012   Obesity 09/12/2012   Overweight(278.02) 09/12/2012   PONV (postoperative nausea and vomiting)    Has nausea   Prostate cancer (HCC)    Snoring 09/12/2012   Squamous cell carcinoma in situ 08/2023   left cheek   Past Surgical History:  Procedure Laterality Date   APPENDECTOMY  70 yrs old   EYE SURGERY  12/27/2003   ,right, injury, decreased visual acuity, photophobia   HERNIA REPAIR  INGUINAL HERNIA REPAIR Right 03/01/2024   Procedure: RIGHT OPEN INGUINAL HERNIA REPAIR WITH MESH;  Surgeon: Kinsinger, Herlene Righter, MD;  Location: [facility] ORS;  Service: General;  Laterality: Right;  LMA & TAP BLOCK   LEFT HEART CATH AND CORONARY ANGIOGRAPHY N/A 07/07/2022   Procedure: LEFT HEART CATH AND CORONARY ANGIOGRAPHY;  Surgeon: Verlin Lonni BIRCH, MD;  Location: [facility] INVASIVE CV LAB;  Service: Cardiovascular;  Laterality: N/A;   LYMPHADENECTOMY Bilateral 10/15/2020   Procedure: LYMPHADENECTOMY, PELVIC;  Surgeon: Renda Glance, MD;  Location: [facility] ORS;  Service: Urology;  Laterality: Bilateral;   PROSTATE BIOPSY     ROBOT ASSISTED LAPAROSCOPIC RADICAL PROSTATECTOMY N/A 10/15/2020    Procedure: XI ROBOTIC ASSISTED LAPAROSCOPIC RADICAL PROSTATECTOMY LEVEL 2;  Surgeon: Renda Glance, MD;  Location: [facility] ORS;  Service: Urology;  Laterality: N/A;   torn meninscus  12/26/2009   knee- right   VASECTOMY  69 yrs old   Family History  Problem Relation Age of Onset   Dementia Mother    Diabetes Father        type 2   Other Brother        whole in heart   Heart disease Brother        sudden cardiac death   Arthritis Paternal Uncle    Stroke Maternal Grandmother        X 2   Colon cancer Neg Hx    Social History   Occupational History   Occupation: Retired - Actor  Tobacco Use   Smoking status: Former    Current packs/day: 0.00    Average packs/day: 2.0 packs/day for 4.0 years (8.0 ttl pk-yrs)    Types: Cigarettes    Start date: 12/26/1977    Quit date: 12/26/1981    Years since quitting: 43.0   Smokeless tobacco: Never  Vaping Use   Vaping status: Never Used  Substance and Sexual Activity   Alcohol use: Yes    Comment: Very rare   Drug use: No   Sexual activity: Yes   Tobacco Counseling Counseling given: Not Answered  SDOH Screenings   Food Insecurity: No Food Insecurity (01/07/2025)  Housing: Low Risk (01/07/2025)  Transportation Needs: No Transportation Needs (01/07/2025)  Utilities: Not At Risk (01/07/2025)  Alcohol Screen: Low Risk (07/03/2024)  Depression (PHQ2-9): Low Risk (01/07/2025)  Financial Resource Strain: Low Risk (07/03/2024)  Physical Activity: Sufficiently Active (01/07/2025)  Social Connections: Moderately Isolated (01/07/2025)  Stress: No Stress Concern Present (01/07/2025)  Tobacco Use: Medium Risk (01/07/2025)   See flowsheets for full screening details  Depression Screen PHQ 2 & 9 Depression Scale- Over the past 2 weeks, how often have you been bothered by any of the following problems? Little interest or pleasure in doing things: 0 Feeling down, depressed, or hopeless (PHQ Adolescent also includes...irritable): 0 PHQ-2 Total Score:  0 Trouble falling or staying asleep, or sleeping too much: 0 Feeling tired or having little energy: 0 Poor appetite or overeating (PHQ Adolescent also includes...weight loss): 0 Feeling bad about yourself - or that you are a failure or have let yourself or your family down: 0 Trouble concentrating on things, such as reading the newspaper or watching television (PHQ Adolescent also includes...like school work): 0 Moving or speaking so slowly that other people could have noticed. Or the opposite - being so fidgety or restless that you have been moving around a lot more than usual: 0 Thoughts that you would be better off dead, or of hurting yourself in some way: 0 PHQ-9  Total Score: 0 If you checked off any problems, how difficult have these problems made it for you to do your work, take care of things at home, or get along with other people?: Not difficult at all  Depression Treatment Depression Interventions/Treatment : EYV7-0 Score <4 Follow-up Not Indicated     Goals Addressed             This Visit's Progress    to start going to the [facility] 4 days a week               Objective:    There were no vitals filed for this visit. There is no height or weight on file to calculate BMI.  Hearing/Vision screen No results found. Immunizations and Health Maintenance Health Maintenance  Topic Date Due   FOOT EXAM  Never done   Pneumococcal Vaccine: 50+ Years (2 of 2 - PPSV23, PCV20, or PCV21) 02/14/2014   Fecal DNA (Cologuard)  03/05/2020   Influenza Vaccine  07/26/2024   Zoster Vaccines- Shingrix (2 of 2) 10/25/2024   HEMOGLOBIN A1C  01/04/2025   Diabetic kidney evaluation - eGFR measurement  07/04/2025   Diabetic kidney evaluation - Urine ACR  07/04/2025   OPHTHALMOLOGY EXAM  10/07/2025   Medicare Annual Wellness (AWV)  01/07/2026   DTaP/Tdap/Td (5 - Td or Tdap) 11/10/2031   Hepatitis C Screening  Completed   Meningococcal B Vaccine  Aged Out   Mammogram  Discontinued    COVID-19 Vaccine  Discontinued        Assessment/Plan:  This is a routine wellness examination for Joseph Fowler.  Patient Care Team: Domenica Harlene LABOR, MD as PCP - General (Family Medicine) O'Neal, Darryle Ned, MD as PCP - Cardiology (Cardiology) Duke, Jon Garre, PA as Physician Assistant (Cardiology) Corey Rufus HERO, MD (Dermatology) Renda Glance, MD as Consulting Physician (Urology)  I have personally reviewed and noted the following in the patients chart:   Medical and social history Use of alcohol, tobacco or illicit drugs  Current medications and supplements including opioid prescriptions. Functional ability and status Nutritional status Physical activity Advanced directives List of other physicians Hospitalizations, surgeries, and ER visits in previous 12 months Vitals Screenings to include cognitive, depression, and falls Referrals and appointments  Orders Placed This Encounter  Procedures   Cologuard    Standing Status:   Future    Expected Date:   01/07/2025    Expiration Date:   01/07/2026   In addition, I have reviewed and discussed with patient certain preventive protocols, quality metrics, and best practice recommendations. A written personalized care plan for preventive services as well as general preventive health recommendations were provided to patient.   Lolita Libra, CMA   01/07/2025   Return in 1 year (on 01/07/2026).  After Visit Summary: (MyChart) Due to this being a telephonic visit, the after visit summary with patients personalized plan was offered to patient via MyChart   Nurse Notes: HM Addressed: Vaccines Due: Flu, pneumonia and 2nd Shingles Cologuard Ordered, will do DM foot exam at upcoming OV  "

## 2025-01-08 ENCOUNTER — Encounter: Payer: Self-pay | Admitting: Cardiovascular Disease

## 2025-02-24 ENCOUNTER — Encounter: Admitting: Family Medicine

## 2025-03-18 ENCOUNTER — Encounter: Admitting: Dermatology

## 2026-01-08 ENCOUNTER — Ambulatory Visit
